# Patient Record
Sex: Male | Born: 1935
Health system: Southern US, Community
[De-identification: ages and names within clinical notes are randomized; demographics above are authoritative.]

## PROBLEM LIST (undated history)

## (undated) DIAGNOSIS — G629 Polyneuropathy, unspecified: Secondary | ICD-10-CM

## (undated) DIAGNOSIS — N170 Acute kidney failure with tubular necrosis: Secondary | ICD-10-CM

## (undated) DIAGNOSIS — I10 Essential (primary) hypertension: Secondary | ICD-10-CM

## (undated) DIAGNOSIS — Z87440 Personal history of urinary (tract) infections: Secondary | ICD-10-CM

## (undated) DIAGNOSIS — F32A Depression, unspecified: Secondary | ICD-10-CM

## (undated) DIAGNOSIS — R079 Chest pain, unspecified: Secondary | ICD-10-CM

## (undated) DIAGNOSIS — R0602 Shortness of breath: Secondary | ICD-10-CM

## (undated) DIAGNOSIS — G709 Myoneural disorder, unspecified: Secondary | ICD-10-CM

## (undated) DIAGNOSIS — C801 Malignant (primary) neoplasm, unspecified: Secondary | ICD-10-CM

## (undated) DIAGNOSIS — IMO0001 Reserved for inherently not codable concepts without codable children: Secondary | ICD-10-CM

## (undated) DIAGNOSIS — M2550 Pain in unspecified joint: Secondary | ICD-10-CM

## (undated) DIAGNOSIS — F329 Major depressive disorder, single episode, unspecified: Secondary | ICD-10-CM

## (undated) DIAGNOSIS — M109 Gout, unspecified: Secondary | ICD-10-CM

## (undated) DIAGNOSIS — D649 Anemia, unspecified: Secondary | ICD-10-CM

## (undated) DIAGNOSIS — H269 Unspecified cataract: Secondary | ICD-10-CM

## (undated) DIAGNOSIS — E785 Hyperlipidemia, unspecified: Secondary | ICD-10-CM

## (undated) DIAGNOSIS — Z860100 Personal history of colon polyps, unspecified: Secondary | ICD-10-CM

## (undated) DIAGNOSIS — K5900 Constipation, unspecified: Secondary | ICD-10-CM

## (undated) DIAGNOSIS — K649 Unspecified hemorrhoids: Secondary | ICD-10-CM

## (undated) DIAGNOSIS — G4700 Insomnia, unspecified: Secondary | ICD-10-CM

## (undated) DIAGNOSIS — N183 Chronic kidney disease, stage 3 unspecified: Secondary | ICD-10-CM

## (undated) DIAGNOSIS — N200 Calculus of kidney: Secondary | ICD-10-CM

## (undated) DIAGNOSIS — H9190 Unspecified hearing loss, unspecified ear: Secondary | ICD-10-CM

## (undated) DIAGNOSIS — Z9289 Personal history of other medical treatment: Secondary | ICD-10-CM

## (undated) DIAGNOSIS — Z8709 Personal history of other diseases of the respiratory system: Secondary | ICD-10-CM

## (undated) DIAGNOSIS — M1990 Unspecified osteoarthritis, unspecified site: Secondary | ICD-10-CM

## (undated) DIAGNOSIS — N400 Enlarged prostate without lower urinary tract symptoms: Secondary | ICD-10-CM

## (undated) HISTORY — DX: Acute kidney failure with tubular necrosis: N17.0

## (undated) HISTORY — PX: HEMORRHOID SURGERY: SHX153

## (undated) HISTORY — DX: Chronic kidney disease, stage 3 unspecified: N18.30

## (undated) HISTORY — DX: Hyperlipidemia, unspecified: E78.5

## (undated) HISTORY — PX: TONSILLECTOMY: SUR1361

## (undated) HISTORY — PX: ESOPHAGOGASTRODUODENOSCOPY: SHX1529

---

## 1962-05-17 HISTORY — PX: APPENDECTOMY: SHX54

## 1998-06-25 ENCOUNTER — Encounter: Payer: Self-pay | Admitting: Urology

## 1998-06-25 ENCOUNTER — Ambulatory Visit (HOSPITAL_BASED_OUTPATIENT_CLINIC_OR_DEPARTMENT_OTHER): Admission: RE | Admit: 1998-06-25 | Discharge: 1998-06-25 | Payer: Self-pay | Admitting: Urology

## 2003-05-18 HISTORY — PX: OTHER SURGICAL HISTORY: SHX169

## 2004-05-17 HISTORY — PX: OTHER SURGICAL HISTORY: SHX169

## 2007-05-18 HISTORY — PX: OTHER SURGICAL HISTORY: SHX169

## 2007-05-18 HISTORY — PX: JOINT REPLACEMENT: SHX530

## 2008-04-01 ENCOUNTER — Inpatient Hospital Stay (HOSPITAL_COMMUNITY): Admission: RE | Admit: 2008-04-01 | Discharge: 2008-04-03 | Payer: Self-pay | Admitting: Orthopedic Surgery

## 2008-05-17 HISTORY — PX: COLONOSCOPY: SHX174

## 2010-09-29 NOTE — Op Note (Signed)
NAME:  [name], [name]                ACCOUNT NO.:  [id number]   MEDICAL RECORD NO.:  [id number]          PATIENT TYPE:  INP   LOCATION:  5030                         FACILITY:  [facility]   PHYSICIAN:  Mila Homer. Sherlean Foot, M.D. DATE OF BIRTH:  Dec 15, 1931   DATE OF PROCEDURE:  04/01/2008  DATE OF DISCHARGE:                               OPERATIVE REPORT   SURGEON:  Mila Homer. Sherlean Foot, MD   ASSISTANTS:  Altamese Cabal, PA-C and Skip Mayer, [place]   PREOPERATIVE DIAGNOSIS:  Left knee osteoarthritis.   POSTOPERATIVE DIAGNOSIS:  Left knee osteoarthritis.   PROCEDURE:  Left total knee arthroplasty.   INDICATIONS FOR PROCEDURE:  The patient is a 77 year old white male with  failure of conservative measures for osteoarthritis of the knee and  informed consent obtained.   DESCRIPTION OF PROCEDURE:  The patient was laid supine and administered  general anesthesia.  Foley catheter placed.  In the supine position, the  left leg was prepped and draped in the usual sterile fashion.  An  Esmarch was used to exsanguinate the extremity and the tourniquet  inflated to 350 mmHg and set for 1 hour.  Midline incision was made with  a #10 blade.  New blade was used to make a medium parapatellar  arthrotomy and to perform a synovectomy.  I then elevated the deep MCL  off the medial crest of the tibia.  I everted the patella and measured  28-mm thick and reamed down 10 mL with a 38 template drilled through the  lug holes.  With the prosthetic trial in place, we created the 28-mm  thickness, then went into flexion subluxing the patella laterally.  Used  the extramedullary alignment system on the tibia to make a perpendicular  cut to the anatomic axis of the tibia.  I then used the intramedullary  alignment guide on the femur to make a 6-degree valgus cut.  I then  drilled the epicondylar axis and posterior condylar angle and measured 3  degrees and the femur sized to a size F.  I used an F four-in-one  cutting  block and made the anterior and posterior chamfer cuts.  I then  placed a lamina spreader in the knee and removed the ACL, PCL, medial  and lateral menisci, and posterior condylar osteophytes.  I then placed  a 12-mm spacer block and I had good flexion/extension gap balance.  I  then finished the femur with a size F finishing block, finished the  tibia with a size 6 tibial tray drilling keel.  Then trialed with a 6  tibia, F femur, 12 insert, 38 patella and had good flexion/extension,  gap balance and good patella tracking.  I removed the trial components  and copiously irrigated.  Then cemented the components removing excess  cement allowing the cement to harden in extension.  I placed a Hemovac  coming out superolaterally deep through the arthrotomy, and pain  catheter coming out superomedially and superficial arthrotomy.  Then, I  let the tourniquet down to obtain hemostasis and copiously irrigated it  again.  Then, I closed the arthrotomy with  figure-of-eight #1 Vicryl  sutures, deep soft tissues were buried with 0 Vicryl sutures.  I then  closed the subcuticular with a running 2-0 Vicryl stitch and the skin  with staples.  Dressed with Xeroform dressing sponges, sterile Webril,  Ace wrap and a TED stocking.   COMPLICATIONS:  None.   DRAINS:  One Hemovac, one pain catheter.   ESTIMATED BLOOD LOSS:  300 mL.   TOURNIQUET TIME:  48 minutes.           ______________________________  Mila Homer Sherlean Foot, M.D.     SDL/MEDQ  D:  04/01/2008  T:  04/01/2008  Job:  [id number]

## 2010-10-02 NOTE — Discharge Summary (Signed)
NAME:  [name], [name]                ACCOUNT NO.:  [id number]   MEDICAL RECORD NO.:  [id number]          PATIENT TYPE:  INP   LOCATION:  5030                         FACILITY:  [facility]   PHYSICIAN:  Mila Homer. Sherlean Foot, M.D. DATE OF BIRTH:  01-13-1932   DATE OF ADMISSION:  04/01/2008  DATE OF DISCHARGE:  04/03/2008                               DISCHARGE SUMMARY   ADMISSION DIAGNOSIS:  Osteoarthritis of the left knee.   DISCHARGE DIAGNOSES:  1. Osteoarthritis of the left knee.  2. Status post total knee arthroscopy, left.  3. Acute blood loss anemia status post surgery.   PROCEDURE:  Left total knee arthroscopy.   HISTORY:  The patient is a 77 year old male who comes in with a  complaint of left knee pain for several years.  The patient's pain is  constant, dull aching pain with some lacking.  He states that it does  not interfere with activities of daily living.  Conservative measures  were failed.  Risks and benefits of surgery were discussed with the  patient.  The patient would like to proceed with surgery.   ALLERGIES:  The patient has no known drug allergies.   ADMISSION MEDICATIONS:  1. Allopurinol 10 once a day.  2. EPO 300 daily.  3. Amlodipine 10 once a day.  4. Flomax 0.4 daily.  5. Lorazepam 2 daily.  6. Gabapentin 300 daily.  7. Tandem daily.  8. B12 500 mg daily.  9. Multivitamin daily.  10.Ibuprofen 200 mg as needed.  11.Tylenol 500 as needed.   The patient is not taking any blood thinners.   HOSPITAL COURSE:  This is a 77 year old male admitted on April 01, 2008 after appropriate laboratory studies were obtained preoperatively  as well as Ancef on-call to the operating room.  He was taken to the OR  where he underwent a left TKA.  The patient tolerated the procedure well  and was taken to the PACU in good condition.  The patient was placed on  Dilaudid PCA pump with reduced dose protocol.  Foley was placed  intraoperatively.   On postop day #1, vital  signs were stable.  The patient denied chest  pain, shortness of breath, or calf pain.  The patient was started on  Lovenox 30 mg subcu q.12 h. at 8 a.m.  Consults to PT, OT, and care  management were made.  The patient was weightbearing as tolerated.  CPM  0-9 degrees for 6-8 hours per day with incentive spirometry teaching.   On postop day #2, the patient continued to progress with physical  therapy.  Dressing was changed.  Marcaine pump and Hemovac were  discontinued.  Foley was discontinued.  PCA was discontinued and IV was  Hep-Lock.  The patient was continued on p.o. meds for pain.  He  tolerated well.  The patient was discharged home with Lovenox teaching.   LABORATORY STUDIES:  Upon admission, the patient's white blood cell  count was 9.8, H and H was 15.3 and 46.3, and platelets were 161.  Sodium was 138, K was 4.4, chloride 105, CO2 of 25,  glucose 95, BUN was  19, and creatinine was 1.47.  Upon discharge, white blood cell count was  9.6, H and H was 10.4 and 31.0, and platelets were 125.  Sodium was 137,  potassium 4.3, chloride was 106, CO2 27, glucose was 107, BUN 17, and  creatinine 1.77.   DISCHARGE INSTRUCTIONS:  There are no restrictions.  Follow blue  instruction sheet for wound care.  Increase activity slowly.  May use a  walker or cane.  Weightbearing as tolerated.  No lifting or driving for  6 weeks and the patient on Home Health.   DISCHARGE MEDICATIONS:  Prescriptions were given for:  1. Lovenox 100 mg inject once subcutaneously daily, last dose given on      April 15, 2008.  2. Robaxin 500 mg 1-2 tablets every 6-8 hours as needed for spasm,      #50.  3. Percocet 5/325 one to two tablets every 4-6 hours as needed for      pain.   The patient will followup with Dr. Sherlean Foot on April 15, 2008, call for  appointment at [phone].   CONDITION ON DISCHARGE:  Improved condition.     ______________________________  Altamese Cabal, PA-C     ______________________________  Mila Homer. Sherlean Foot, M.D.    MJ/MEDQ  D:  05/09/2008  T:  05/09/2008  Job:  [id number]

## 2011-02-16 LAB — BASIC METABOLIC PANEL
CO2: 27
Chloride: 100
Chloride: 106
Creatinine, Ser: 1.5
Creatinine, Ser: 1.77 — ABNORMAL HIGH
GFR calc Af Amer: 46 — ABNORMAL LOW
GFR calc Af Amer: 55 — ABNORMAL LOW
GFR calc non Af Amer: 46 — ABNORMAL LOW
Potassium: 4.3
Sodium: 137

## 2011-02-16 LAB — CROSSMATCH

## 2011-02-16 LAB — COMPREHENSIVE METABOLIC PANEL
ALT: 23
AST: 34
Albumin: 4.3
Alkaline Phosphatase: 68
BUN: 19
CO2: 25
Calcium: 9.6
Chloride: 105
Creatinine, Ser: 1.47
GFR calc Af Amer: 57 — ABNORMAL LOW
GFR calc non Af Amer: 47 — ABNORMAL LOW
Glucose, Bld: 95
Potassium: 4.4
Sodium: 138
Total Bilirubin: 1.4 — ABNORMAL HIGH
Total Protein: 7

## 2011-02-16 LAB — CBC
HCT: 31 — ABNORMAL LOW
HCT: 46.3
Hemoglobin: 10.4 — ABNORMAL LOW
Hemoglobin: 15.3
MCHC: 33
MCHC: 33.6
MCV: 95.6
MCV: 97
MCV: 97.2
Platelets: 161
RBC: 3.19 — ABNORMAL LOW
RBC: 3.51 — ABNORMAL LOW
RBC: 4.77
RDW: 14.6
WBC: 9.6
WBC: 9.8
WBC: 9.8

## 2011-02-16 LAB — URINALYSIS, ROUTINE W REFLEX MICROSCOPIC
Nitrite: NEGATIVE
Specific Gravity, Urine: 1.017
Urobilinogen, UA: 0.2

## 2011-02-16 LAB — PROTIME-INR
INR: 1
Prothrombin Time: 13.7

## 2011-02-16 LAB — DIFFERENTIAL
Basophils Absolute: 0.1
Basophils Relative: 1
Eosinophils Absolute: 0.1
Eosinophils Relative: 1
Lymphocytes Relative: 14
Lymphs Abs: 1.4
Monocytes Absolute: 0.6
Monocytes Relative: 6
Neutro Abs: 7.7
Neutrophils Relative %: 79 — ABNORMAL HIGH

## 2011-02-16 LAB — APTT: aPTT: 41 — ABNORMAL HIGH

## 2011-02-16 LAB — ABO/RH: ABO/RH(D): B NEG

## 2011-02-16 LAB — URINE CULTURE

## 2011-05-25 DIAGNOSIS — H9190 Unspecified hearing loss, unspecified ear: Secondary | ICD-10-CM | POA: Diagnosis not present

## 2011-05-25 DIAGNOSIS — H6120 Impacted cerumen, unspecified ear: Secondary | ICD-10-CM | POA: Diagnosis not present

## 2011-06-22 DIAGNOSIS — B351 Tinea unguium: Secondary | ICD-10-CM | POA: Diagnosis not present

## 2011-06-22 DIAGNOSIS — M79609 Pain in unspecified limb: Secondary | ICD-10-CM | POA: Diagnosis not present

## 2011-07-12 DIAGNOSIS — R109 Unspecified abdominal pain: Secondary | ICD-10-CM | POA: Diagnosis not present

## 2011-08-03 DIAGNOSIS — H2510 Age-related nuclear cataract, unspecified eye: Secondary | ICD-10-CM | POA: Diagnosis not present

## 2011-08-31 DIAGNOSIS — M79609 Pain in unspecified limb: Secondary | ICD-10-CM | POA: Diagnosis not present

## 2011-08-31 DIAGNOSIS — B351 Tinea unguium: Secondary | ICD-10-CM | POA: Diagnosis not present

## 2011-09-25 DIAGNOSIS — J069 Acute upper respiratory infection, unspecified: Secondary | ICD-10-CM | POA: Diagnosis not present

## 2011-09-25 DIAGNOSIS — R05 Cough: Secondary | ICD-10-CM | POA: Diagnosis not present

## 2011-10-05 DIAGNOSIS — N529 Male erectile dysfunction, unspecified: Secondary | ICD-10-CM | POA: Diagnosis not present

## 2011-10-05 DIAGNOSIS — N401 Enlarged prostate with lower urinary tract symptoms: Secondary | ICD-10-CM | POA: Diagnosis not present

## 2011-10-05 DIAGNOSIS — B3789 Other sites of candidiasis: Secondary | ICD-10-CM | POA: Diagnosis not present

## 2011-10-06 DIAGNOSIS — R109 Unspecified abdominal pain: Secondary | ICD-10-CM | POA: Diagnosis not present

## 2011-10-12 DIAGNOSIS — M5430 Sciatica, unspecified side: Secondary | ICD-10-CM | POA: Diagnosis not present

## 2011-10-14 DIAGNOSIS — K7689 Other specified diseases of liver: Secondary | ICD-10-CM | POA: Diagnosis not present

## 2011-10-14 DIAGNOSIS — N289 Disorder of kidney and ureter, unspecified: Secondary | ICD-10-CM | POA: Diagnosis not present

## 2011-10-14 DIAGNOSIS — R109 Unspecified abdominal pain: Secondary | ICD-10-CM | POA: Diagnosis not present

## 2011-10-20 DIAGNOSIS — E669 Obesity, unspecified: Secondary | ICD-10-CM | POA: Diagnosis not present

## 2011-10-20 DIAGNOSIS — M5430 Sciatica, unspecified side: Secondary | ICD-10-CM | POA: Diagnosis not present

## 2011-10-21 DIAGNOSIS — M545 Low back pain: Secondary | ICD-10-CM | POA: Diagnosis not present

## 2011-10-25 DIAGNOSIS — M545 Low back pain: Secondary | ICD-10-CM | POA: Diagnosis not present

## 2011-10-26 DIAGNOSIS — M5430 Sciatica, unspecified side: Secondary | ICD-10-CM | POA: Diagnosis not present

## 2011-10-26 DIAGNOSIS — F329 Major depressive disorder, single episode, unspecified: Secondary | ICD-10-CM | POA: Diagnosis not present

## 2011-10-27 DIAGNOSIS — M5137 Other intervertebral disc degeneration, lumbosacral region: Secondary | ICD-10-CM | POA: Diagnosis not present

## 2011-10-27 DIAGNOSIS — M47817 Spondylosis without myelopathy or radiculopathy, lumbosacral region: Secondary | ICD-10-CM | POA: Diagnosis not present

## 2011-10-27 DIAGNOSIS — M5430 Sciatica, unspecified side: Secondary | ICD-10-CM | POA: Diagnosis not present

## 2011-10-27 DIAGNOSIS — M48061 Spinal stenosis, lumbar region without neurogenic claudication: Secondary | ICD-10-CM | POA: Diagnosis not present

## 2011-11-02 DIAGNOSIS — IMO0002 Reserved for concepts with insufficient information to code with codable children: Secondary | ICD-10-CM | POA: Diagnosis not present

## 2011-11-02 DIAGNOSIS — M5126 Other intervertebral disc displacement, lumbar region: Secondary | ICD-10-CM | POA: Diagnosis not present

## 2011-11-04 DIAGNOSIS — L821 Other seborrheic keratosis: Secondary | ICD-10-CM | POA: Diagnosis not present

## 2011-11-04 DIAGNOSIS — L570 Actinic keratosis: Secondary | ICD-10-CM | POA: Diagnosis not present

## 2011-11-04 DIAGNOSIS — L578 Other skin changes due to chronic exposure to nonionizing radiation: Secondary | ICD-10-CM | POA: Diagnosis not present

## 2011-11-05 DIAGNOSIS — IMO0002 Reserved for concepts with insufficient information to code with codable children: Secondary | ICD-10-CM | POA: Diagnosis not present

## 2011-11-05 DIAGNOSIS — M48061 Spinal stenosis, lumbar region without neurogenic claudication: Secondary | ICD-10-CM | POA: Diagnosis not present

## 2011-11-09 DIAGNOSIS — B351 Tinea unguium: Secondary | ICD-10-CM | POA: Diagnosis not present

## 2011-11-09 DIAGNOSIS — M79609 Pain in unspecified limb: Secondary | ICD-10-CM | POA: Diagnosis not present

## 2011-11-22 DIAGNOSIS — IMO0002 Reserved for concepts with insufficient information to code with codable children: Secondary | ICD-10-CM | POA: Diagnosis not present

## 2011-11-22 DIAGNOSIS — M48061 Spinal stenosis, lumbar region without neurogenic claudication: Secondary | ICD-10-CM | POA: Diagnosis not present

## 2011-12-13 DIAGNOSIS — M48061 Spinal stenosis, lumbar region without neurogenic claudication: Secondary | ICD-10-CM | POA: Diagnosis not present

## 2011-12-13 DIAGNOSIS — IMO0002 Reserved for concepts with insufficient information to code with codable children: Secondary | ICD-10-CM | POA: Diagnosis not present

## 2012-01-04 DIAGNOSIS — M5430 Sciatica, unspecified side: Secondary | ICD-10-CM | POA: Diagnosis not present

## 2012-01-04 DIAGNOSIS — G479 Sleep disorder, unspecified: Secondary | ICD-10-CM | POA: Diagnosis not present

## 2012-01-10 DIAGNOSIS — M5126 Other intervertebral disc displacement, lumbar region: Secondary | ICD-10-CM | POA: Diagnosis not present

## 2012-01-10 DIAGNOSIS — IMO0002 Reserved for concepts with insufficient information to code with codable children: Secondary | ICD-10-CM | POA: Diagnosis not present

## 2012-01-13 DIAGNOSIS — M79609 Pain in unspecified limb: Secondary | ICD-10-CM | POA: Diagnosis not present

## 2012-01-13 DIAGNOSIS — B351 Tinea unguium: Secondary | ICD-10-CM | POA: Diagnosis not present

## 2012-01-27 DIAGNOSIS — IMO0002 Reserved for concepts with insufficient information to code with codable children: Secondary | ICD-10-CM | POA: Diagnosis not present

## 2012-01-27 DIAGNOSIS — M5137 Other intervertebral disc degeneration, lumbosacral region: Secondary | ICD-10-CM | POA: Diagnosis not present

## 2012-01-27 DIAGNOSIS — Z79899 Other long term (current) drug therapy: Secondary | ICD-10-CM | POA: Diagnosis not present

## 2012-01-27 DIAGNOSIS — Z5181 Encounter for therapeutic drug level monitoring: Secondary | ICD-10-CM | POA: Diagnosis not present

## 2012-02-10 DIAGNOSIS — Z23 Encounter for immunization: Secondary | ICD-10-CM | POA: Diagnosis not present

## 2012-02-10 DIAGNOSIS — J069 Acute upper respiratory infection, unspecified: Secondary | ICD-10-CM | POA: Diagnosis not present

## 2012-02-23 DIAGNOSIS — M5137 Other intervertebral disc degeneration, lumbosacral region: Secondary | ICD-10-CM | POA: Diagnosis not present

## 2012-03-06 DIAGNOSIS — M545 Low back pain: Secondary | ICD-10-CM | POA: Diagnosis not present

## 2012-03-06 DIAGNOSIS — G894 Chronic pain syndrome: Secondary | ICD-10-CM | POA: Diagnosis not present

## 2012-03-20 DIAGNOSIS — L821 Other seborrheic keratosis: Secondary | ICD-10-CM | POA: Diagnosis not present

## 2012-03-20 DIAGNOSIS — D2370 Other benign neoplasm of skin of unspecified lower limb, including hip: Secondary | ICD-10-CM | POA: Diagnosis not present

## 2012-03-20 DIAGNOSIS — L570 Actinic keratosis: Secondary | ICD-10-CM | POA: Diagnosis not present

## 2012-03-22 DIAGNOSIS — M5137 Other intervertebral disc degeneration, lumbosacral region: Secondary | ICD-10-CM | POA: Diagnosis not present

## 2012-03-29 DIAGNOSIS — M79609 Pain in unspecified limb: Secondary | ICD-10-CM | POA: Diagnosis not present

## 2012-03-29 DIAGNOSIS — B351 Tinea unguium: Secondary | ICD-10-CM | POA: Diagnosis not present

## 2012-04-03 DIAGNOSIS — M545 Low back pain: Secondary | ICD-10-CM | POA: Diagnosis not present

## 2012-04-03 DIAGNOSIS — M79609 Pain in unspecified limb: Secondary | ICD-10-CM | POA: Diagnosis not present

## 2012-04-03 DIAGNOSIS — G894 Chronic pain syndrome: Secondary | ICD-10-CM | POA: Diagnosis not present

## 2012-04-17 DIAGNOSIS — J209 Acute bronchitis, unspecified: Secondary | ICD-10-CM | POA: Diagnosis not present

## 2012-05-17 HISTORY — PX: OTHER SURGICAL HISTORY: SHX169

## 2012-05-26 DIAGNOSIS — L821 Other seborrheic keratosis: Secondary | ICD-10-CM | POA: Diagnosis not present

## 2012-05-26 DIAGNOSIS — F329 Major depressive disorder, single episode, unspecified: Secondary | ICD-10-CM | POA: Diagnosis not present

## 2012-05-26 DIAGNOSIS — C44320 Squamous cell carcinoma of skin of unspecified parts of face: Secondary | ICD-10-CM | POA: Diagnosis not present

## 2012-05-26 DIAGNOSIS — G609 Hereditary and idiopathic neuropathy, unspecified: Secondary | ICD-10-CM | POA: Diagnosis not present

## 2012-05-26 DIAGNOSIS — L570 Actinic keratosis: Secondary | ICD-10-CM | POA: Diagnosis not present

## 2012-05-26 DIAGNOSIS — I10 Essential (primary) hypertension: Secondary | ICD-10-CM | POA: Diagnosis not present

## 2012-05-26 DIAGNOSIS — E7800 Pure hypercholesterolemia, unspecified: Secondary | ICD-10-CM | POA: Diagnosis not present

## 2012-05-26 DIAGNOSIS — G479 Sleep disorder, unspecified: Secondary | ICD-10-CM | POA: Diagnosis not present

## 2012-05-29 DIAGNOSIS — M79609 Pain in unspecified limb: Secondary | ICD-10-CM | POA: Diagnosis not present

## 2012-05-29 DIAGNOSIS — M25559 Pain in unspecified hip: Secondary | ICD-10-CM | POA: Diagnosis not present

## 2012-05-29 DIAGNOSIS — M545 Low back pain: Secondary | ICD-10-CM | POA: Diagnosis not present

## 2012-05-29 DIAGNOSIS — G894 Chronic pain syndrome: Secondary | ICD-10-CM | POA: Diagnosis not present

## 2012-06-11 DIAGNOSIS — I889 Nonspecific lymphadenitis, unspecified: Secondary | ICD-10-CM | POA: Diagnosis not present

## 2012-06-21 DIAGNOSIS — C44319 Basal cell carcinoma of skin of other parts of face: Secondary | ICD-10-CM | POA: Diagnosis not present

## 2012-06-22 DIAGNOSIS — N401 Enlarged prostate with lower urinary tract symptoms: Secondary | ICD-10-CM | POA: Diagnosis not present

## 2012-06-22 DIAGNOSIS — Z125 Encounter for screening for malignant neoplasm of prostate: Secondary | ICD-10-CM | POA: Diagnosis not present

## 2012-06-22 DIAGNOSIS — R3911 Hesitancy of micturition: Secondary | ICD-10-CM | POA: Diagnosis not present

## 2012-07-01 DIAGNOSIS — J069 Acute upper respiratory infection, unspecified: Secondary | ICD-10-CM | POA: Diagnosis not present

## 2012-07-01 DIAGNOSIS — J209 Acute bronchitis, unspecified: Secondary | ICD-10-CM | POA: Diagnosis not present

## 2012-07-03 DIAGNOSIS — J189 Pneumonia, unspecified organism: Secondary | ICD-10-CM | POA: Diagnosis not present

## 2012-07-03 DIAGNOSIS — R062 Wheezing: Secondary | ICD-10-CM | POA: Diagnosis not present

## 2012-07-06 DIAGNOSIS — R062 Wheezing: Secondary | ICD-10-CM | POA: Diagnosis not present

## 2012-07-06 DIAGNOSIS — J189 Pneumonia, unspecified organism: Secondary | ICD-10-CM | POA: Diagnosis not present

## 2012-07-24 DIAGNOSIS — M25559 Pain in unspecified hip: Secondary | ICD-10-CM | POA: Diagnosis not present

## 2012-07-24 DIAGNOSIS — M545 Low back pain: Secondary | ICD-10-CM | POA: Diagnosis not present

## 2012-07-24 DIAGNOSIS — M79609 Pain in unspecified limb: Secondary | ICD-10-CM | POA: Diagnosis not present

## 2012-07-24 DIAGNOSIS — G894 Chronic pain syndrome: Secondary | ICD-10-CM | POA: Diagnosis not present

## 2012-08-10 DIAGNOSIS — B351 Tinea unguium: Secondary | ICD-10-CM | POA: Diagnosis not present

## 2012-08-10 DIAGNOSIS — M79609 Pain in unspecified limb: Secondary | ICD-10-CM | POA: Diagnosis not present

## 2012-08-17 DIAGNOSIS — M25559 Pain in unspecified hip: Secondary | ICD-10-CM | POA: Diagnosis not present

## 2012-08-17 DIAGNOSIS — G894 Chronic pain syndrome: Secondary | ICD-10-CM | POA: Diagnosis not present

## 2012-08-17 DIAGNOSIS — Z96659 Presence of unspecified artificial knee joint: Secondary | ICD-10-CM | POA: Diagnosis not present

## 2012-08-17 DIAGNOSIS — M79609 Pain in unspecified limb: Secondary | ICD-10-CM | POA: Diagnosis not present

## 2012-08-17 DIAGNOSIS — M545 Low back pain: Secondary | ICD-10-CM | POA: Diagnosis not present

## 2012-08-17 HISTORY — DX: Presence of unspecified artificial knee joint: Z96.659

## 2012-09-06 DIAGNOSIS — G894 Chronic pain syndrome: Secondary | ICD-10-CM | POA: Diagnosis not present

## 2012-09-06 DIAGNOSIS — M545 Low back pain, unspecified: Secondary | ICD-10-CM | POA: Diagnosis not present

## 2012-09-06 DIAGNOSIS — M5137 Other intervertebral disc degeneration, lumbosacral region: Secondary | ICD-10-CM | POA: Diagnosis not present

## 2012-09-06 DIAGNOSIS — IMO0002 Reserved for concepts with insufficient information to code with codable children: Secondary | ICD-10-CM | POA: Diagnosis not present

## 2012-09-18 DIAGNOSIS — L821 Other seborrheic keratosis: Secondary | ICD-10-CM | POA: Diagnosis not present

## 2012-09-18 DIAGNOSIS — L570 Actinic keratosis: Secondary | ICD-10-CM | POA: Diagnosis not present

## 2012-09-20 DIAGNOSIS — IMO0002 Reserved for concepts with insufficient information to code with codable children: Secondary | ICD-10-CM | POA: Diagnosis not present

## 2012-09-20 DIAGNOSIS — M5137 Other intervertebral disc degeneration, lumbosacral region: Secondary | ICD-10-CM | POA: Diagnosis not present

## 2012-09-20 DIAGNOSIS — G894 Chronic pain syndrome: Secondary | ICD-10-CM | POA: Diagnosis not present

## 2012-10-13 DIAGNOSIS — M47817 Spondylosis without myelopathy or radiculopathy, lumbosacral region: Secondary | ICD-10-CM | POA: Diagnosis not present

## 2012-10-13 DIAGNOSIS — IMO0002 Reserved for concepts with insufficient information to code with codable children: Secondary | ICD-10-CM | POA: Diagnosis not present

## 2012-10-13 DIAGNOSIS — G894 Chronic pain syndrome: Secondary | ICD-10-CM | POA: Diagnosis not present

## 2012-10-13 DIAGNOSIS — M5137 Other intervertebral disc degeneration, lumbosacral region: Secondary | ICD-10-CM | POA: Diagnosis not present

## 2012-10-17 DIAGNOSIS — B351 Tinea unguium: Secondary | ICD-10-CM | POA: Diagnosis not present

## 2012-10-17 DIAGNOSIS — M79609 Pain in unspecified limb: Secondary | ICD-10-CM | POA: Diagnosis not present

## 2012-11-22 DIAGNOSIS — M545 Low back pain, unspecified: Secondary | ICD-10-CM | POA: Diagnosis not present

## 2012-11-22 DIAGNOSIS — G8929 Other chronic pain: Secondary | ICD-10-CM | POA: Diagnosis not present

## 2012-11-22 DIAGNOSIS — G894 Chronic pain syndrome: Secondary | ICD-10-CM | POA: Diagnosis not present

## 2012-12-18 DIAGNOSIS — M47817 Spondylosis without myelopathy or radiculopathy, lumbosacral region: Secondary | ICD-10-CM | POA: Diagnosis not present

## 2012-12-18 DIAGNOSIS — M5137 Other intervertebral disc degeneration, lumbosacral region: Secondary | ICD-10-CM | POA: Diagnosis not present

## 2012-12-18 DIAGNOSIS — G894 Chronic pain syndrome: Secondary | ICD-10-CM | POA: Diagnosis not present

## 2012-12-18 DIAGNOSIS — IMO0002 Reserved for concepts with insufficient information to code with codable children: Secondary | ICD-10-CM | POA: Diagnosis not present

## 2012-12-20 DIAGNOSIS — N529 Male erectile dysfunction, unspecified: Secondary | ICD-10-CM | POA: Diagnosis not present

## 2012-12-20 DIAGNOSIS — N401 Enlarged prostate with lower urinary tract symptoms: Secondary | ICD-10-CM | POA: Diagnosis not present

## 2012-12-21 DIAGNOSIS — Z006 Encounter for examination for normal comparison and control in clinical research program: Secondary | ICD-10-CM | POA: Diagnosis not present

## 2012-12-21 DIAGNOSIS — I10 Essential (primary) hypertension: Secondary | ICD-10-CM | POA: Diagnosis not present

## 2012-12-21 DIAGNOSIS — G479 Sleep disorder, unspecified: Secondary | ICD-10-CM | POA: Diagnosis not present

## 2012-12-21 DIAGNOSIS — Z0000 Encounter for general adult medical examination without abnormal findings: Secondary | ICD-10-CM | POA: Diagnosis not present

## 2012-12-26 DIAGNOSIS — M79609 Pain in unspecified limb: Secondary | ICD-10-CM | POA: Diagnosis not present

## 2012-12-26 DIAGNOSIS — B351 Tinea unguium: Secondary | ICD-10-CM | POA: Diagnosis not present

## 2013-01-02 DIAGNOSIS — IMO0002 Reserved for concepts with insufficient information to code with codable children: Secondary | ICD-10-CM | POA: Diagnosis not present

## 2013-01-04 DIAGNOSIS — M5430 Sciatica, unspecified side: Secondary | ICD-10-CM | POA: Diagnosis not present

## 2013-01-05 DIAGNOSIS — M47817 Spondylosis without myelopathy or radiculopathy, lumbosacral region: Secondary | ICD-10-CM | POA: Diagnosis not present

## 2013-01-05 DIAGNOSIS — IMO0002 Reserved for concepts with insufficient information to code with codable children: Secondary | ICD-10-CM | POA: Diagnosis not present

## 2013-01-05 DIAGNOSIS — G894 Chronic pain syndrome: Secondary | ICD-10-CM | POA: Diagnosis not present

## 2013-01-05 DIAGNOSIS — M5137 Other intervertebral disc degeneration, lumbosacral region: Secondary | ICD-10-CM | POA: Diagnosis not present

## 2013-01-22 DIAGNOSIS — I10 Essential (primary) hypertension: Secondary | ICD-10-CM | POA: Diagnosis not present

## 2013-01-22 DIAGNOSIS — M5430 Sciatica, unspecified side: Secondary | ICD-10-CM | POA: Diagnosis not present

## 2013-01-22 DIAGNOSIS — M549 Dorsalgia, unspecified: Secondary | ICD-10-CM | POA: Diagnosis not present

## 2013-01-22 DIAGNOSIS — M25569 Pain in unspecified knee: Secondary | ICD-10-CM | POA: Diagnosis not present

## 2013-01-22 DIAGNOSIS — R296 Repeated falls: Secondary | ICD-10-CM | POA: Diagnosis not present

## 2013-01-22 DIAGNOSIS — M25559 Pain in unspecified hip: Secondary | ICD-10-CM | POA: Diagnosis not present

## 2013-01-29 DIAGNOSIS — Z23 Encounter for immunization: Secondary | ICD-10-CM | POA: Diagnosis not present

## 2013-01-29 DIAGNOSIS — I10 Essential (primary) hypertension: Secondary | ICD-10-CM | POA: Diagnosis not present

## 2013-01-29 DIAGNOSIS — M5430 Sciatica, unspecified side: Secondary | ICD-10-CM | POA: Diagnosis not present

## 2013-01-30 ENCOUNTER — Emergency Department (HOSPITAL_COMMUNITY): Payer: Medicare Other

## 2013-01-30 ENCOUNTER — Encounter (HOSPITAL_COMMUNITY): Payer: Self-pay | Admitting: *Deleted

## 2013-01-30 ENCOUNTER — Emergency Department (HOSPITAL_COMMUNITY)
Admission: EM | Admit: 2013-01-30 | Discharge: 2013-01-30 | Disposition: A | Discharge status: Home / Self Care | Payer: Medicare Other | Attending: Emergency Medicine | Admitting: Emergency Medicine

## 2013-01-30 DIAGNOSIS — M25559 Pain in unspecified hip: Secondary | ICD-10-CM | POA: Diagnosis not present

## 2013-01-30 DIAGNOSIS — S79919A Unspecified injury of unspecified hip, initial encounter: Secondary | ICD-10-CM | POA: Diagnosis not present

## 2013-01-30 DIAGNOSIS — Z79899 Other long term (current) drug therapy: Secondary | ICD-10-CM | POA: Diagnosis not present

## 2013-01-30 DIAGNOSIS — S76012A Strain of muscle, fascia and tendon of left hip, initial encounter: Secondary | ICD-10-CM

## 2013-01-30 DIAGNOSIS — IMO0002 Reserved for concepts with insufficient information to code with codable children: Secondary | ICD-10-CM | POA: Diagnosis not present

## 2013-01-30 DIAGNOSIS — Y929 Unspecified place or not applicable: Secondary | ICD-10-CM | POA: Insufficient documentation

## 2013-01-30 DIAGNOSIS — G9009 Other idiopathic peripheral autonomic neuropathy: Secondary | ICD-10-CM | POA: Diagnosis not present

## 2013-01-30 DIAGNOSIS — R112 Nausea with vomiting, unspecified: Secondary | ICD-10-CM | POA: Diagnosis not present

## 2013-01-30 DIAGNOSIS — I10 Essential (primary) hypertension: Secondary | ICD-10-CM | POA: Insufficient documentation

## 2013-01-30 DIAGNOSIS — N400 Enlarged prostate without lower urinary tract symptoms: Secondary | ICD-10-CM | POA: Diagnosis not present

## 2013-01-30 DIAGNOSIS — M25569 Pain in unspecified knee: Secondary | ICD-10-CM | POA: Diagnosis not present

## 2013-01-30 DIAGNOSIS — M48061 Spinal stenosis, lumbar region without neurogenic claudication: Secondary | ICD-10-CM | POA: Diagnosis not present

## 2013-01-30 DIAGNOSIS — M25552 Pain in left hip: Secondary | ICD-10-CM

## 2013-01-30 DIAGNOSIS — S8990XA Unspecified injury of unspecified lower leg, initial encounter: Secondary | ICD-10-CM | POA: Diagnosis not present

## 2013-01-30 DIAGNOSIS — Y9389 Activity, other specified: Secondary | ICD-10-CM | POA: Insufficient documentation

## 2013-01-30 DIAGNOSIS — S336XXA Sprain of sacroiliac joint, initial encounter: Secondary | ICD-10-CM | POA: Diagnosis not present

## 2013-01-30 DIAGNOSIS — W19XXXA Unspecified fall, initial encounter: Secondary | ICD-10-CM | POA: Insufficient documentation

## 2013-01-30 DIAGNOSIS — M5126 Other intervertebral disc displacement, lumbar region: Secondary | ICD-10-CM | POA: Diagnosis not present

## 2013-01-30 HISTORY — DX: Polyneuropathy, unspecified: G62.9

## 2013-01-30 HISTORY — DX: Essential (primary) hypertension: I10

## 2013-01-30 HISTORY — DX: Benign prostatic hyperplasia without lower urinary tract symptoms: N40.0

## 2013-01-30 LAB — URINALYSIS, ROUTINE W REFLEX MICROSCOPIC
Glucose, UA: NEGATIVE mg/dL
Hgb urine dipstick: NEGATIVE
Leukocytes, UA: NEGATIVE
Protein, ur: NEGATIVE mg/dL
Specific Gravity, Urine: 1.005 (ref 1.005–1.030)
Urobilinogen, UA: 0.2 mg/dL (ref 0.0–1.0)

## 2013-01-30 LAB — CBC WITH DIFFERENTIAL/PLATELET
Basophils Relative: 0 % (ref 0–1)
Eosinophils Absolute: 0.1 10*3/uL (ref 0.0–0.7)
Hemoglobin: 14.5 g/dL (ref 13.0–17.0)
Lymphs Abs: 1.5 10*3/uL (ref 0.7–4.0)
Monocytes Relative: 6 % (ref 3–12)
Neutro Abs: 10.4 10*3/uL — ABNORMAL HIGH (ref 1.7–7.7)
Neutrophils Relative %: 82 % — ABNORMAL HIGH (ref 43–77)
Platelets: 189 10*3/uL (ref 150–400)
RBC: 4.71 MIL/uL (ref 4.22–5.81)

## 2013-01-30 LAB — COMPREHENSIVE METABOLIC PANEL
BUN: 14 mg/dL (ref 6–23)
CO2: 27 mEq/L (ref 19–32)
Calcium: 10.2 mg/dL (ref 8.4–10.5)
Chloride: 97 mEq/L (ref 96–112)
Creatinine, Ser: 1.3 mg/dL (ref 0.50–1.35)
GFR calc Af Amer: 59 mL/min — ABNORMAL LOW (ref >90)
GFR calc non Af Amer: 51 mL/min — ABNORMAL LOW (ref >90)
Glucose, Bld: 124 mg/dL — ABNORMAL HIGH (ref 70–99)
Total Bilirubin: 0.8 mg/dL (ref 0.3–1.2)

## 2013-01-30 MED ORDER — LORAZEPAM 2 MG/ML IJ SOLN
0.5000 mg | Freq: Once | INTRAMUSCULAR | Status: AC
  Administered 2013-01-30: 0.5 mg via INTRAVENOUS

## 2013-01-30 MED ORDER — LORAZEPAM 2 MG/ML IJ SOLN
1.0000 mg | Freq: Once | INTRAMUSCULAR | Status: DC
  Filled 2013-01-30: qty 1

## 2013-01-30 NOTE — ED Provider Notes (Signed)
CSN: [id number]     Arrival date & time 01/30/13  1212 History   First MD Initiated Contact with Patient 01/30/13 1232     Chief Complaint  Patient presents with  . Leg Pain  . Fall   (Consider location/radiation/quality/duration/timing/severity/associated sxs/prior Treatment) HPI  [name] is a 79 y.o.male with a significant PMH of hypertension, neuropathy, enlarged prostate presents to the ER with complaints of  left leg pain and weakness as well as nausea and vomiting without abdominal pain. He has a long history of pain to the left leg for which he has received injections. Over the past couple of days he has fallen 4 times and since then has had worsened weakness to his left leg. He takes Vicodin 10-325 at home for pain and has over the past 3 weeks which has helped some with the pain but he is still not able to ambulate even with his walker. This is what brought him to the ER. The patient is very active and is troubled by not being able to get out of the house much and out and about. He also has had nausea and vomiting this morning but without belly pain or dysuria. Denies loc, syncope, change in weight, generalized weakness or fevers.    Past Medical History  Diagnosis Date  . Hypertension   . Neuropathy   . Enlarged prostate    Past Surgical History  Procedure Laterality Date  . Joint replacement     History reviewed. No pertinent family history. History  Substance Use Topics  . Smoking status: Not on file  . Smokeless tobacco: Not on file  . Alcohol Use: No    Review of Systems  All other systems reviewed and are negative.    Allergies  Review of patient's allergies indicates no known allergies.  Home Medications   Current Outpatient Rx  Name  Route  Sig  Dispense  Refill  . acetaminophen (TYLENOL) 500 MG tablet   Oral   Take 1,000 mg by mouth every 6 (six) hours as needed for pain.         Marland Kitchen allopurinol (ZYLOPRIM) 300 MG tablet   Oral   Take 300 mg by  mouth every morning.         Marland Kitchen amLODipine (NORVASC) 10 MG tablet   Oral   Take 10 mg by mouth every morning.         . cyanocobalamin 500 MCG tablet   Oral   Take 500 mcg by mouth daily.         . ferrous fumarate-iron polysaccharide complex (TANDEM) 162-115.2 MG CAPS   Oral   Take 1 capsule by mouth daily with breakfast.         . gabapentin (NEURONTIN) 600 MG tablet   Oral   Take 600 mg by mouth 2 (two) times daily.         Marland Kitchen ibuprofen (ADVIL,MOTRIN) 200 MG tablet   Oral   Take 400 mg by mouth every 6 (six) hours as needed for pain.         Marland Kitchen irbesartan (AVAPRO) 300 MG tablet   Oral   Take 300 mg by mouth every morning.         . Multiple Vitamin (MULTIVITAMIN WITH MINERALS) TABS tablet   Oral   Take 1 tablet by mouth daily.         Marland Kitchen oxyCODONE-acetaminophen (PERCOCET) 10-325 MG per tablet   Oral   Take 1 tablet by mouth every  4 (four) hours as needed for pain.         . tamsulosin (FLOMAX) 0.4 MG CAPS capsule   Oral   Take 0.4 mg by mouth daily after breakfast.         . zolpidem (AMBIEN) 10 MG tablet   Oral   Take 10 mg by mouth at bedtime.          BP 150/82  Pulse 76  Temp(Src) 97.8 F (36.6 C) (Oral)  Resp 18  SpO2 93% Physical Exam  Nursing note and vitals reviewed. Constitutional: He appears well-developed and well-nourished. No distress.  HENT:  Head: Normocephalic and atraumatic.  Eyes: Pupils are equal, round, and reactive to light.  Neck: Normal range of motion. Neck supple.  Cardiovascular: Normal rate and regular rhythm.   Pulmonary/Chest: Effort normal.  Abdominal: Soft.  Musculoskeletal:       Left hip: He exhibits decreased strength. He exhibits normal range of motion, no tenderness, no bony tenderness, no swelling, no crepitus, no deformity and no laceration.  Pt does not have tenderness to palpation of left hip. He does not have pain with ROM. He has equal ankle strength.  Pt has significant weakness in his left  hip and good strength in the right side. His pedal pulses are equal bilaterally.   Neurological: He is alert.  Skin: Skin is warm and dry.    ED Course  Procedures (including critical care time) Labs Review Labs Reviewed  COMPREHENSIVE METABOLIC PANEL - Abnormal; Notable for the following:    Glucose, Bld 124 (*)    GFR calc non Af Amer 51 (*)    GFR calc Af Amer 59 (*)    All other components within normal limits  CBC WITH DIFFERENTIAL - Abnormal; Notable for the following:    WBC 12.7 (*)    Neutrophils Relative % 82 (*)    Neutro Abs 10.4 (*)    All other components within normal limits  LIPASE, BLOOD  URINALYSIS, ROUTINE W REFLEX MICROSCOPIC   Imaging Review Mr Lumbar Spine Wo Contrast  01/30/2013   *RADIOLOGY REPORT*  Clinical Data: Back pain with left leg giving out.  MRI LUMBAR SPINE WITHOUT CONTRAST  Multiplanar and multiecho pulse sequences of the lumbar spine were obtained without intravenous contrast.  Comparison: 10/27/2011 MR.  Findings: Last fully open disc space is labeled L5-S1.  Present examination incorporates from T10-11 and disc space through the lower S2 level.  Conus L1 level.  Probable hemangioma within left aspect of the T11 vertebral body without change.  Visualized paravertebral structures without worrisome abnormality. Dependent subcutaneous edema has progressed since prior exam.  T10-11 through T12-L1 unremarkable.  L1-2:  Facet joint degenerative changes.  Bulge slightly greater right lateral position.  No compression of the exiting nerve root. Very mild spinal stenosis.  L2-3:  Marked disc degeneration with broad-based disc osteophyte complex with slight retrolisthesis of L2.  Facet joint degenerative changes.  Multifactorial moderate spinal stenosis.  The right lateral extension of disc/osteophyte approaches but does not cause significant compression of the exiting right L2 nerve root.  L3-4:  Large broad-based disc protrusion with greatest extension centrally  and left lateral position. Mass effect upon the exiting left L3 nerve root.  Facet joint degenerative changes.  Ligamentum flavum hypertrophy.  Short pedicles.  Severe spinal stenosis.  L4-5:  Broad-based disc osteophyte complex.  Facet joint degenerative changes.  Ligamentum flavum hypertrophy.  Short pedicles.  Severe spinal stenosis.  Moderate foraminal narrowing greater on  the left with encroachment upon the exiting L4 nerve roots greater on the left.  L5-S1:  Prominent facet joint degenerative changes.  Bulge/broad- based protrusion.  Centrally this contributes to the moderate left- sided lateral recess stenosis and mild spinal stenosis.  Bilateral foramen greater left without nerve root compression.  IMPRESSION: Progressive degenerative changes since the prior examination with findings most notable L3-4 and L4-5 level. Summary of pertinent findings includes:  L1-2 very mild spinal stenosis.  L2-3 multifactorial moderate spinal stenosis.  Right lateral extension of disc/osteophyte approaches but does not cause significant compression of the exiting right L2 nerve root.  L3-4 large broad-based disc protrusion with greatest extension centrally and left lateral position. Mass effect upon the exiting left L3 nerve root. Multifactorial severe spinal stenosis.  L4-5 multifactorial severe spinal stenosis.  Moderate foraminal narrowing greater on the left with encroachment upon the exiting L4 nerve roots greater on the left.  L5-S1 moderate left lateral recess stenosis and mild spinal stenosis.  Bilateral foramen greater on the left without nerve root compression.   Original Report Authenticated By: Lacy Duverney, M.D.   Mr Hip Left Wo Contrast  01/30/2013   CLINICAL DATA:  History of falls. Left hip pain.  EXAM: MRI OF THE LEFT HIP WITHOUT CONTRAST  TECHNIQUE: Multiplanar, multisequence MR imaging was performed. No intravenous contrast was administered.  COMPARISON:  Plain films left hip 01/22/2013.  FINDINGS: The  hips are located. There is no hip fracture. The pelvis is also intact. No hip joint effusion is present. The patient has mild to moderate bilateral hip degenerative change more notable on the left. There is some fluid in the trochanteric bursa bilaterally, worse on the left, consistent with trochanteric bursitis. There is some mild edema about the left gluteus medius at the musculotendinous junction compatible with sprain. No tear is identified. Pelvic musculature is otherwise unremarkable. Degenerative disease of lower lumbar spine is noted and described on dedicated report of lumbar spine MRI this same day.  IMPRESSION: 1. No acute finding. Negative for fracture.  2. Mild strain of the gluteus medius on the left without frank tear.  3. Small amount of fluid in the trochanteric bursa, greater on the left, compatible with bursitis.  4. Mild to moderate bilateral hip degenerative change, worse on the left.   Electronically Signed   By: Drusilla Kanner M.D.   On: 01/30/2013 18:17   Dg Knee Complete 4 Views Left  01/30/2013   *RADIOLOGY REPORT*  Clinical Data: Left leg pain following a fall.  LEFT KNEE - COMPLETE 4+ VIEW  Comparison: 01/22/2013.  Findings: Anatomic alignment the left knee.  No fracture or hardware complication identified.  Uncomplicated left three-part total knee arthroplasty.  IMPRESSION: No acute abnormality.  Uncomplicated left three-part total knee arthroplasty.   Original Report Authenticated By: Andreas Newport, M.D.    MDM   1. Hip pain, left   2. Muscle strain of gluteal region, left, initial encounter    Dr. Manus Gunning saw patient as well. Pt not currently having any pain and ambulated to the restroom on his own.  His MRI shows some pathology for weakness and pain in the left leg. I discussed with Dr. Jordan Likes who will see patient in the office something in the next couple of days. Pt also has a gluteal strain which may also be the cause of his pain being worse.  Discussed with Dr.  Manus Gunning prior to discharge,   79 y.o.Burns Pigman's evaluation in the Emergency Department is complete. It  has been determined that no acute conditions requiring further emergency intervention are present at this time. The patient/guardian have been advised of the diagnosis and plan. We have discussed signs and symptoms that warrant return to the ED, such as changes or worsening in symptoms.  Vital signs are stable at discharge. Filed Vitals:   01/30/13 1215  BP: 150/82  Pulse: 76  Temp: 97.8 F (36.6 C)  Resp: 18    Patient/guardian has voiced understanding and agreed to follow-up with the PCP or specialist.     Dorthula Matas, PA-C 01/30/13 1847

## 2013-01-30 NOTE — ED Provider Notes (Signed)
Medical screening examination/treatment/procedure(s) were conducted as a shared visit with non-physician practitioner(s) and myself.  I personally evaluated the patient during the encounter  Acute and chronic left leg pain with recurrent falls leg weakness that is intermittent. Denies head injury back pain, fever, vomiting. 4/5 strength in left lower extremity, 5/5 strength in R lower extremity. Ankle plantar and dorsiflexion intact. Great toe extension intact bilaterally. +2 DP and PT pulses. +2 patellar reflexes bilaterally. Normal gait. Full Range of motion of left hip without pain.  Glynn Octave, MD 01/30/13 [phone]

## 2013-01-30 NOTE — ED Notes (Signed)
Pt reports hx of back pain and problems with left leg giving out on him and having falls. Has been to a back dr and given injections but is now having to switch drs and having increase in falls again and pain to left leg. This am also having n/v.

## 2013-02-01 DIAGNOSIS — M79609 Pain in unspecified limb: Secondary | ICD-10-CM | POA: Diagnosis not present

## 2013-02-01 DIAGNOSIS — Z96659 Presence of unspecified artificial knee joint: Secondary | ICD-10-CM | POA: Diagnosis not present

## 2013-02-02 DIAGNOSIS — M47817 Spondylosis without myelopathy or radiculopathy, lumbosacral region: Secondary | ICD-10-CM | POA: Diagnosis not present

## 2013-02-02 DIAGNOSIS — M5126 Other intervertebral disc displacement, lumbar region: Secondary | ICD-10-CM | POA: Diagnosis not present

## 2013-02-07 ENCOUNTER — Other Ambulatory Visit: Payer: Self-pay | Admitting: Neurosurgery

## 2013-02-12 ENCOUNTER — Encounter (HOSPITAL_COMMUNITY): Payer: Self-pay | Admitting: Pharmacy Technician

## 2013-02-13 DIAGNOSIS — S72143A Displaced intertrochanteric fracture of unspecified femur, initial encounter for closed fracture: Secondary | ICD-10-CM | POA: Diagnosis not present

## 2013-02-13 DIAGNOSIS — R52 Pain, unspecified: Secondary | ICD-10-CM | POA: Diagnosis not present

## 2013-02-13 DIAGNOSIS — W010XXA Fall on same level from slipping, tripping and stumbling without subsequent striking against object, initial encounter: Secondary | ICD-10-CM | POA: Diagnosis not present

## 2013-02-13 DIAGNOSIS — N189 Chronic kidney disease, unspecified: Secondary | ICD-10-CM | POA: Diagnosis not present

## 2013-02-13 DIAGNOSIS — S72009A Fracture of unspecified part of neck of unspecified femur, initial encounter for closed fracture: Secondary | ICD-10-CM | POA: Diagnosis not present

## 2013-02-13 DIAGNOSIS — I129 Hypertensive chronic kidney disease with stage 1 through stage 4 chronic kidney disease, or unspecified chronic kidney disease: Secondary | ICD-10-CM | POA: Diagnosis not present

## 2013-02-13 DIAGNOSIS — M25559 Pain in unspecified hip: Secondary | ICD-10-CM | POA: Diagnosis not present

## 2013-02-13 NOTE — Pre-Procedure Instructions (Signed)
[name]  02/13/2013   Your procedure is scheduled on:  02/16/13  Report to [facility] short stay admitting at 530  AM.  Call this number if you have problems the morning of surgery: [phone]   Remember:   Do not eat food or drink liquids after midnight.   Take these medicines the morning of surgery with A SIP OF WATER: allopurinol, amlodipine, gabpentin, flomax, pain med if needed             STOP ibuprofen now   Do not wear jewelry, make-up or nail polish.  Do not wear lotions, powders, or perfumes. You may wear deodorant.  Do not shave 48 hours prior to surgery. Men may shave face and neck.  Do not bring valuables to the hospital.  [facility] is not responsible                  for any belongings or valuables.               Contacts, dentures or bridgework may not be worn into surgery.  Leave suitcase in the car. After surgery it may be brought to your room.  For patients admitted to the hospital, discharge time is determined by your                treatment team.               Patients discharged the day of surgery will not be allowed to drive  home.  Name and phone number of your driver:   Special Instructions: Shower using CHG 2 nights before surgery and the night before surgery.  If you shower the day of surgery use CHG.  Use special wash - you have one bottle of CHG for all showers.  You should use approximately 1/3 of the bottle for each shower.   Please read over the following fact sheets that you were given: Pain Booklet, Coughing and Deep Breathing, MRSA Information and Surgical Site Infection Prevention

## 2013-02-14 ENCOUNTER — Inpatient Hospital Stay (HOSPITAL_COMMUNITY)
Admission: AD | Admit: 2013-02-14 | Discharge: 2013-02-20 | DRG: 481 | Disposition: A | Discharge status: Skilled Nursing Facility | Payer: Medicare Other | Source: Other Acute Inpatient Hospital | Attending: Internal Medicine | Admitting: Internal Medicine

## 2013-02-14 ENCOUNTER — Other Ambulatory Visit: Payer: Self-pay

## 2013-02-14 ENCOUNTER — Inpatient Hospital Stay (HOSPITAL_COMMUNITY)
Admission: RE | Admit: 2013-02-14 | Discharge: 2013-02-14 | Disposition: A | Discharge status: Home / Self Care | Payer: Medicare Other | Source: Ambulatory Visit

## 2013-02-14 ENCOUNTER — Encounter (HOSPITAL_COMMUNITY): Payer: Self-pay | Admitting: *Deleted

## 2013-02-14 ENCOUNTER — Inpatient Hospital Stay (HOSPITAL_COMMUNITY): Payer: Medicare Other

## 2013-02-14 DIAGNOSIS — M25459 Effusion, unspecified hip: Secondary | ICD-10-CM | POA: Diagnosis not present

## 2013-02-14 DIAGNOSIS — S72009D Fracture of unspecified part of neck of unspecified femur, subsequent encounter for closed fracture with routine healing: Secondary | ICD-10-CM | POA: Diagnosis not present

## 2013-02-14 DIAGNOSIS — M48061 Spinal stenosis, lumbar region without neurogenic claudication: Secondary | ICD-10-CM

## 2013-02-14 DIAGNOSIS — G709 Myoneural disorder, unspecified: Secondary | ICD-10-CM | POA: Insufficient documentation

## 2013-02-14 DIAGNOSIS — S72141A Displaced intertrochanteric fracture of right femur, initial encounter for closed fracture: Secondary | ICD-10-CM

## 2013-02-14 DIAGNOSIS — F411 Generalized anxiety disorder: Secondary | ICD-10-CM | POA: Diagnosis present

## 2013-02-14 DIAGNOSIS — G4730 Sleep apnea, unspecified: Secondary | ICD-10-CM | POA: Diagnosis present

## 2013-02-14 DIAGNOSIS — K5730 Diverticulosis of large intestine without perforation or abscess without bleeding: Secondary | ICD-10-CM | POA: Diagnosis not present

## 2013-02-14 DIAGNOSIS — M25559 Pain in unspecified hip: Secondary | ICD-10-CM | POA: Diagnosis not present

## 2013-02-14 DIAGNOSIS — S72009A Fracture of unspecified part of neck of unspecified femur, initial encounter for closed fracture: Secondary | ICD-10-CM | POA: Diagnosis not present

## 2013-02-14 DIAGNOSIS — M6281 Muscle weakness (generalized): Secondary | ICD-10-CM | POA: Diagnosis not present

## 2013-02-14 DIAGNOSIS — K5989 Other specified functional intestinal disorders: Secondary | ICD-10-CM | POA: Diagnosis not present

## 2013-02-14 DIAGNOSIS — R141 Gas pain: Secondary | ICD-10-CM | POA: Diagnosis not present

## 2013-02-14 DIAGNOSIS — K560 Paralytic ileus: Secondary | ICD-10-CM | POA: Diagnosis not present

## 2013-02-14 DIAGNOSIS — M4800 Spinal stenosis, site unspecified: Secondary | ICD-10-CM | POA: Diagnosis not present

## 2013-02-14 DIAGNOSIS — G629 Polyneuropathy, unspecified: Secondary | ICD-10-CM | POA: Insufficient documentation

## 2013-02-14 DIAGNOSIS — M24659 Ankylosis, unspecified hip: Secondary | ICD-10-CM | POA: Diagnosis present

## 2013-02-14 DIAGNOSIS — M169 Osteoarthritis of hip, unspecified: Secondary | ICD-10-CM | POA: Diagnosis not present

## 2013-02-14 DIAGNOSIS — S72109A Unspecified trochanteric fracture of unspecified femur, initial encounter for closed fracture: Secondary | ICD-10-CM | POA: Diagnosis not present

## 2013-02-14 DIAGNOSIS — R0989 Other specified symptoms and signs involving the circulatory and respiratory systems: Secondary | ICD-10-CM | POA: Diagnosis not present

## 2013-02-14 DIAGNOSIS — G4720 Circadian rhythm sleep disorder, unspecified type: Secondary | ICD-10-CM | POA: Diagnosis not present

## 2013-02-14 DIAGNOSIS — I10 Essential (primary) hypertension: Secondary | ICD-10-CM | POA: Diagnosis not present

## 2013-02-14 DIAGNOSIS — K567 Ileus, unspecified: Secondary | ICD-10-CM

## 2013-02-14 DIAGNOSIS — Z5189 Encounter for other specified aftercare: Secondary | ICD-10-CM | POA: Diagnosis not present

## 2013-02-14 DIAGNOSIS — C801 Malignant (primary) neoplasm, unspecified: Secondary | ICD-10-CM

## 2013-02-14 DIAGNOSIS — G609 Hereditary and idiopathic neuropathy, unspecified: Secondary | ICD-10-CM | POA: Diagnosis not present

## 2013-02-14 DIAGNOSIS — F329 Major depressive disorder, single episode, unspecified: Secondary | ICD-10-CM | POA: Diagnosis present

## 2013-02-14 DIAGNOSIS — R262 Difficulty in walking, not elsewhere classified: Secondary | ICD-10-CM | POA: Diagnosis not present

## 2013-02-14 DIAGNOSIS — R339 Retention of urine, unspecified: Secondary | ICD-10-CM | POA: Diagnosis not present

## 2013-02-14 DIAGNOSIS — S7223XA Displaced subtrochanteric fracture of unspecified femur, initial encounter for closed fracture: Secondary | ICD-10-CM | POA: Diagnosis not present

## 2013-02-14 DIAGNOSIS — K3189 Other diseases of stomach and duodenum: Secondary | ICD-10-CM | POA: Diagnosis not present

## 2013-02-14 DIAGNOSIS — N400 Enlarged prostate without lower urinary tract symptoms: Secondary | ICD-10-CM | POA: Diagnosis not present

## 2013-02-14 DIAGNOSIS — N401 Enlarged prostate with lower urinary tract symptoms: Secondary | ICD-10-CM | POA: Diagnosis not present

## 2013-02-14 DIAGNOSIS — S72143A Displaced intertrochanteric fracture of unspecified femur, initial encounter for closed fracture: Secondary | ICD-10-CM

## 2013-02-14 DIAGNOSIS — IMO0002 Reserved for concepts with insufficient information to code with codable children: Secondary | ICD-10-CM | POA: Diagnosis not present

## 2013-02-14 DIAGNOSIS — R296 Repeated falls: Secondary | ICD-10-CM | POA: Diagnosis not present

## 2013-02-14 DIAGNOSIS — N179 Acute kidney failure, unspecified: Secondary | ICD-10-CM | POA: Diagnosis not present

## 2013-02-14 DIAGNOSIS — M159 Polyosteoarthritis, unspecified: Secondary | ICD-10-CM | POA: Diagnosis not present

## 2013-02-14 DIAGNOSIS — G589 Mononeuropathy, unspecified: Secondary | ICD-10-CM | POA: Diagnosis present

## 2013-02-14 HISTORY — DX: Major depressive disorder, single episode, unspecified: F32.9

## 2013-02-14 HISTORY — DX: Unspecified osteoarthritis, unspecified site: M19.90

## 2013-02-14 HISTORY — DX: Displaced intertrochanteric fracture of right femur, initial encounter for closed fracture: S72.141A

## 2013-02-14 HISTORY — DX: Myoneural disorder, unspecified: G70.9

## 2013-02-14 HISTORY — DX: Depression, unspecified: F32.A

## 2013-02-14 HISTORY — DX: Spinal stenosis, lumbar region without neurogenic claudication: M48.061

## 2013-02-14 HISTORY — DX: Malignant (primary) neoplasm, unspecified: C80.1

## 2013-02-14 LAB — CBC
Hemoglobin: 13 g/dL (ref 13.0–17.0)
MCHC: 34.9 g/dL (ref 30.0–36.0)
Platelets: 164 10*3/uL (ref 150–400)
RDW: 14.1 % (ref 11.5–15.5)

## 2013-02-14 LAB — SURGICAL PCR SCREEN
MRSA, PCR: NEGATIVE
Staphylococcus aureus: NEGATIVE

## 2013-02-14 LAB — BASIC METABOLIC PANEL
Calcium: 9.2 mg/dL (ref 8.4–10.5)
GFR calc Af Amer: 53 mL/min — ABNORMAL LOW (ref >90)
GFR calc non Af Amer: 46 mL/min — ABNORMAL LOW (ref >90)
Potassium: 3.9 mEq/L (ref 3.5–5.1)
Sodium: 135 mEq/L (ref 135–145)

## 2013-02-14 MED ORDER — IRBESARTAN 300 MG PO TABS
300.0000 mg | ORAL_TABLET | Freq: Every morning | ORAL | Status: DC
  Administered 2013-02-14 – 2013-02-15 (×2): 300 mg via ORAL
  Filled 2013-02-14 (×3): qty 1

## 2013-02-14 MED ORDER — CYANOCOBALAMIN 500 MCG PO TABS
500.0000 ug | ORAL_TABLET | Freq: Every day | ORAL | Status: DC
  Administered 2013-02-14: 16:00:00 500 ug via ORAL
  Administered 2013-02-15: 1000 ug via ORAL
  Filled 2013-02-14 (×3): qty 1

## 2013-02-14 MED ORDER — MORPHINE SULFATE 2 MG/ML IJ SOLN: 0.5000 mg | INTRAMUSCULAR | Status: DC | PRN

## 2013-02-14 MED ORDER — HYDROCODONE-ACETAMINOPHEN 5-325 MG PO TABS: 1.0000 | ORAL_TABLET | Freq: Four times a day (QID) | ORAL | Status: DC | PRN

## 2013-02-14 MED ORDER — POLYSACCHARIDE IRON COMPLEX 150 MG PO CAPS
150.0000 mg | ORAL_CAPSULE | Freq: Every day | ORAL | Status: DC
  Administered 2013-02-14 – 2013-02-15 (×2): 150 mg via ORAL
  Filled 2013-02-14 (×3): qty 1

## 2013-02-14 MED ORDER — SODIUM CHLORIDE 0.9 % IV SOLN
INTRAVENOUS | Status: DC
  Administered 2013-02-14 – 2013-02-15 (×2): via INTRAVENOUS

## 2013-02-14 MED ORDER — DEXAMETHASONE SODIUM PHOSPHATE 10 MG/ML IJ SOLN: 10.0000 mg | INTRAMUSCULAR | Status: DC

## 2013-02-14 MED ORDER — GABAPENTIN 600 MG PO TABS
600.0000 mg | ORAL_TABLET | Freq: Two times a day (BID) | ORAL | Status: DC
  Administered 2013-02-14 – 2013-02-15 (×3): 600 mg via ORAL
  Filled 2013-02-14 (×6): qty 1

## 2013-02-14 MED ORDER — AMLODIPINE BESYLATE 10 MG PO TABS
10.0000 mg | ORAL_TABLET | Freq: Every morning | ORAL | Status: DC
  Administered 2013-02-14 – 2013-02-15 (×2): 10 mg via ORAL
  Filled 2013-02-14 (×3): qty 1

## 2013-02-14 MED ORDER — FERROUS FUM-IRON POLYSACCH 162-115.2 MG PO CAPS: 1.0000 | ORAL_CAPSULE | Freq: Every day | ORAL | Status: DC

## 2013-02-14 MED ORDER — ZOLPIDEM TARTRATE 5 MG PO TABS
5.0000 mg | ORAL_TABLET | Freq: Every day | ORAL | Status: DC
  Administered 2013-02-14 – 2013-02-15 (×2): 5 mg via ORAL
  Filled 2013-02-14 (×2): qty 1

## 2013-02-14 MED ORDER — CEFAZOLIN SODIUM 10 G IJ SOLR
3.0000 g | INTRAMUSCULAR | Status: DC
  Filled 2013-02-14 (×2): qty 3000

## 2013-02-14 MED ORDER — ASPIRIN EC 325 MG PO TBEC
325.0000 mg | DELAYED_RELEASE_TABLET | Freq: Every day | ORAL | Status: DC
  Filled 2013-02-14: qty 1

## 2013-02-14 MED ORDER — HYDROMORPHONE HCL PF 1 MG/ML IJ SOLN
1.0000 mg | INTRAMUSCULAR | Status: DC | PRN
  Administered 2013-02-14 – 2013-02-18 (×12): 1 mg via INTRAVENOUS
  Filled 2013-02-14 (×12): qty 1

## 2013-02-14 MED ORDER — ADULT MULTIVITAMIN W/MINERALS CH
1.0000 | ORAL_TABLET | Freq: Every day | ORAL | Status: DC
  Administered 2013-02-14 – 2013-02-15 (×2): 1 via ORAL
  Filled 2013-02-14 (×3): qty 1

## 2013-02-14 MED ORDER — CEFAZOLIN SODIUM-DEXTROSE 2-3 GM-% IV SOLR
2.0000 g | Freq: Once | INTRAVENOUS | Status: AC
  Administered 2013-02-15: 2 g via INTRAVENOUS
  Filled 2013-02-14: qty 50

## 2013-02-14 MED ORDER — CHLORHEXIDINE GLUCONATE 4 % EX LIQD
60.0000 mL | Freq: Once | CUTANEOUS | Status: AC
  Administered 2013-02-14: 4 via TOPICAL
  Filled 2013-02-14: qty 60

## 2013-02-14 MED ORDER — TAMSULOSIN HCL 0.4 MG PO CAPS
0.4000 mg | ORAL_CAPSULE | Freq: Every day | ORAL | Status: DC
  Administered 2013-02-14 – 2013-02-15 (×2): 0.4 mg via ORAL
  Filled 2013-02-14 (×4): qty 1

## 2013-02-14 MED ORDER — MORPHINE SULFATE 2 MG/ML IJ SOLN
1.0000 mg | Freq: Once | INTRAMUSCULAR | Status: DC
  Filled 2013-02-14: qty 1

## 2013-02-14 NOTE — Progress Notes (Deleted)
Late Entry 0545,pt complained of not being able to pee,DR Julian Reil gave order to insert foley.Attempted to insert 14 french catheter without success per hospital policy.pt tolerated fair,unable  To pass catheter due to resistance.DR Julian Reil aware see physicians orders,will continue to monitor.

## 2013-02-14 NOTE — Progress Notes (Signed)
Brief Nutrition Note:  RD pulled to pt for hip fracture protocol.  RD discussed intake and appetite with pt. Denied any recent weight loss or decreased in appetite. Body mass index is 33.06 kg/(m^2). obesity class 1.  Expect good appetite post op.   Chart reviewed no nutrition interventions warranted at this time. Please re-consult as needed.   Isabell Jarvis RD, LDN Pager [phone] After Hours pager [phone]

## 2013-02-14 NOTE — Progress Notes (Signed)
Nursing Note: Attempted to insert 16 Fr coude catheter without success per hospital policy.Pt tolerated fair. Unable to pass catheter,was able to get past first area then met resistance and unable to advance catheter.Pt's nurse at bedside and is aware.Pt has urology consult per his nurse.Ripley Bogosian b Kyion Gautier,rn

## 2013-02-14 NOTE — Progress Notes (Signed)
Utilization review complete. [name] RN CCM Case Mgmt  

## 2013-02-14 NOTE — Consult Note (Signed)
NAME: [name] MRN:   [id number] DOB:   [birth year]     HISTORY AND PHYSICAL  CHIEF COMPLAINT:  Right hip pain  HISTORY:   [name] a 79 y.o. male  who suffered a fall today due to L leg weakness when he raised his R leg to step on a spider. He has been suffering L leg weakness for some weeks now secondary to lumbar spinal stenosis, he had been planning on going to OR with Dr. Jordan Likes on Friday this week for Lumbar procedure. Unfortunately his L leg gave out on him and he fell, landing on his R hip. His R hip was painful, and unable to bear weight. In the ED at [facility] he was found to have a R intertrochanteric hip fracture with displacement. His orthopaedic surgeon of choice Dr. Sherlean Foot happens to be on call for [facility] tonight and so was contacted, patient [facility].  PAST MEDICAL HISTORY:   Past Medical History  Diagnosis Date  . Hypertension   . Neuropathy   . Enlarged prostate   . Anxiety   . Depression   . Sleep apnea   . Neuromuscular disorder 02/14/13    damnage nerves in bil feet  . Cancer 02/14/13    skin cancer  . Arthritis     PAST SURGICAL HISTORY:   Past Surgical History  Procedure Laterality Date  . Joint replacement      MEDICATIONS:   Medications Prior to Admission  Medication Sig Dispense Refill  . acetaminophen (TYLENOL) 500 MG tablet Take 1,000 mg by mouth every 6 (six) hours as needed for pain.      Marland Kitchen allopurinol (ZYLOPRIM) 300 MG tablet Take 300 mg by mouth every morning.      Marland Kitchen amLODipine (NORVASC) 10 MG tablet Take 10 mg by mouth every morning.      . cyanocobalamin 500 MCG tablet Take 500 mcg by mouth daily.      . ferrous fumarate-iron polysaccharide complex (TANDEM) 162-115.2 MG CAPS Take 1 capsule by mouth daily with breakfast.      . gabapentin (NEURONTIN) 600 MG tablet Take 600 mg by mouth 2 (two) times daily.      Marland Kitchen ibuprofen (ADVIL,MOTRIN) 200 MG tablet Take 400 mg by mouth every 6 (six) hours as needed for pain.       Marland Kitchen irbesartan (AVAPRO) 300 MG tablet Take 300 mg by mouth every morning.      . Multiple Vitamin (MULTIVITAMIN WITH MINERALS) TABS tablet Take 1 tablet by mouth daily.      Marland Kitchen oxyCODONE-acetaminophen (PERCOCET) 10-325 MG per tablet Take 1 tablet by mouth every 4 (four) hours as needed for pain.      . tamsulosin (FLOMAX) 0.4 MG CAPS capsule Take 0.4 mg by mouth daily after breakfast.      . zolpidem (AMBIEN) 10 MG tablet Take 10 mg by mouth at bedtime.        ALLERGIES:  No Known Allergies  REVIEW OF SYSTEMS:   Negative except HPI  FAMILY HISTORY:  History reviewed. No pertinent family history.  SOCIAL HISTORY:   reports that he has never smoked. He does not have any smokeless tobacco history on file. He reports that he does not drink alcohol or use illicit drugs.  PHYSICAL EXAM:  General appearance: alert, cooperative and no distress Resp: clear to auscultation bilaterally Cardio: regular rate and rhythm, S1, S2 normal, no murmur, click, rub or gallop GI: soft, non-tender; bowel sounds normal; no  masses,  no organomegaly Extremities: Homans sign is negative, no sign of DVT Pulses: 2+ and symmetric Skin: Skin color, texture, turgor normal. No rashes or lesions Neurologic: Alert and oriented X 3, normal strength and tone. Normal symmetric reflexes. Normal coordination and gait  R leg shortened and rotated   LABORATORY STUDIES: No results found for this basename: WBC, HGB, HCT, PLT,  in the last 72 hours  No results found for this basename: NA, K, CL, CO2, GLUCOSE, BUN, CREATININE, CALCIUM,  in the last 72 hours  STUDIES/RESULTS:  Mr Lumbar Spine Wo Contrast  01/30/2013   *RADIOLOGY REPORT*  Clinical Data: Back pain with left leg giving out.  MRI LUMBAR SPINE WITHOUT CONTRAST  Multiplanar and multiecho pulse sequences of the lumbar spine were obtained without intravenous contrast.  Comparison: 10/27/2011 MR.  Findings: Last fully open disc space is labeled L5-S1.  Present examination  incorporates from T10-11 and disc space through the lower S2 level.  Conus L1 level.  Probable hemangioma within left aspect of the T11 vertebral body without change.  Visualized paravertebral structures without worrisome abnormality. Dependent subcutaneous edema has progressed since prior exam.  T10-11 through T12-L1 unremarkable.  L1-2:  Facet joint degenerative changes.  Bulge slightly greater right lateral position.  No compression of the exiting nerve root. Very mild spinal stenosis.  L2-3:  Marked disc degeneration with broad-based disc osteophyte complex with slight retrolisthesis of L2.  Facet joint degenerative changes.  Multifactorial moderate spinal stenosis.  The right lateral extension of disc/osteophyte approaches but does not cause significant compression of the exiting right L2 nerve root.  L3-4:  Large broad-based disc protrusion with greatest extension centrally and left lateral position. Mass effect upon the exiting left L3 nerve root.  Facet joint degenerative changes.  Ligamentum flavum hypertrophy.  Short pedicles.  Severe spinal stenosis.  L4-5:  Broad-based disc osteophyte complex.  Facet joint degenerative changes.  Ligamentum flavum hypertrophy.  Short pedicles.  Severe spinal stenosis.  Moderate foraminal narrowing greater on the left with encroachment upon the exiting L4 nerve roots greater on the left.  L5-S1:  Prominent facet joint degenerative changes.  Bulge/broad- based protrusion.  Centrally this contributes to the moderate left- sided lateral recess stenosis and mild spinal stenosis.  Bilateral foramen greater left without nerve root compression.  IMPRESSION: Progressive degenerative changes since the prior examination with findings most notable L3-4 and L4-5 level. Summary of pertinent findings includes:  L1-2 very mild spinal stenosis.  L2-3 multifactorial moderate spinal stenosis.  Right lateral extension of disc/osteophyte approaches but does not cause significant compression  of the exiting right L2 nerve root.  L3-4 large broad-based disc protrusion with greatest extension centrally and left lateral position. Mass effect upon the exiting left L3 nerve root. Multifactorial severe spinal stenosis.  L4-5 multifactorial severe spinal stenosis.  Moderate foraminal narrowing greater on the left with encroachment upon the exiting L4 nerve roots greater on the left.  L5-S1 moderate left lateral recess stenosis and mild spinal stenosis.  Bilateral foramen greater on the left without nerve root compression.   Original Report Authenticated By: Lacy Duverney, M.D.   Mr Hip Left Wo Contrast  01/30/2013   CLINICAL DATA:  History of falls. Left hip pain.  EXAM: MRI OF THE LEFT HIP WITHOUT CONTRAST  TECHNIQUE: Multiplanar, multisequence MR imaging was performed. No intravenous contrast was administered.  COMPARISON:  Plain films left hip 01/22/2013.  FINDINGS: The hips are located. There is no hip fracture. The pelvis is also intact.  No hip joint effusion is present. The patient has mild to moderate bilateral hip degenerative change more notable on the left. There is some fluid in the trochanteric bursa bilaterally, worse on the left, consistent with trochanteric bursitis. There is some mild edema about the left gluteus medius at the musculotendinous junction compatible with sprain. No tear is identified. Pelvic musculature is otherwise unremarkable. Degenerative disease of lower lumbar spine is noted and described on dedicated report of lumbar spine MRI this same day.  IMPRESSION: 1. No acute finding. Negative for fracture.  2. Mild strain of the gluteus medius on the left without frank tear.  3. Small amount of fluid in the trochanteric bursa, greater on the left, compatible with bursitis.  4. Mild to moderate bilateral hip degenerative change, worse on the left.   Electronically Signed   By: Drusilla Kanner M.D.   On: 01/30/2013 18:17   Dg Knee Complete 4 Views Left  01/30/2013   *RADIOLOGY  REPORT*  Clinical Data: Left leg pain following a fall.  LEFT KNEE - COMPLETE 4+ VIEW  Comparison: 01/22/2013.  Findings: Anatomic alignment the left knee.  No fracture or hardware complication identified.  Uncomplicated left three-part total knee arthroplasty.  IMPRESSION: No acute abnormality.  Uncomplicated left three-part total knee arthroplasty.   Original Report Authenticated By: Andreas Newport, M.D.    ASSESSMENT:  Right hip intertrochateric fracture        Principal Problem:   Displaced intertrochanteric fracture of right femur Active Problems:   Spinal stenosis of lumbar region    PLAN:  Right IM nail today   Temperence Zenor 02/14/2013. 7:05 AM

## 2013-02-14 NOTE — H&P (Signed)
[facility] History and Physical  Corion Sherrod ZOX:[id number] DOB: 1932-01-04 DOA: 02/14/2013  Referring physician: [facility] ED PCP: No primary provider on file.   Chief Complaint: Fall, R hip pain  HPI: [name] is a 79 y.o. male who suffered a fall today due to L leg weakness when he raised his R leg to step on a spider.  He has been suffering L leg weakness for some weeks now secondary to lumbar spinal stenosis, he had been planning on going to OR with Dr. Jordan Likes on Friday this week for Lumbar procedure.  Unfortunately his L leg gave out on him and he fell, landing on his R hip.  His R hip was painful, and unable to bear weight.  In the ED at [facility] he was found to have a R intertrochanteric hip fracture with displacement.  His orthopaedic surgeon of choice Dr. Sherlean Foot happens to be on call for [facility] tonight and so was contacted, patient transferred [facility].  Review of Systems: Patient very active at baseline, no chest pain nor SOB, 12 systems reviewed and otherwise negative.  Past Medical History  Diagnosis Date  . Hypertension   . Neuropathy   . Enlarged prostate   . Anxiety   . Depression   . Sleep apnea   . Neuromuscular disorder 02/14/13    damnage nerves in bil feet  . Cancer 02/14/13    skin cancer  . Arthritis    Past Surgical History  Procedure Laterality Date  . Joint replacement     Social History:  reports that he has never smoked. He does not have any smokeless tobacco history on file. He reports that he does not drink alcohol or use illicit drugs.  No Known Allergies  History reviewed. No pertinent family history.  Prior to Admission medications   Medication Sig Start Date End Date Taking? Authorizing Provider  acetaminophen (TYLENOL) 500 MG tablet Take 1,000 mg by mouth every 6 (six) hours as needed for pain.    Historical Provider, MD  allopurinol (ZYLOPRIM) 300 MG tablet Take 300 mg by mouth every morning.    Historical Provider, MD   amLODipine (NORVASC) 10 MG tablet Take 10 mg by mouth every morning.    Historical Provider, MD  cyanocobalamin 500 MCG tablet Take 500 mcg by mouth daily.    Historical Provider, MD  ferrous fumarate-iron polysaccharide complex (TANDEM) 162-115.2 MG CAPS Take 1 capsule by mouth daily with breakfast.    Historical Provider, MD  gabapentin (NEURONTIN) 600 MG tablet Take 600 mg by mouth 2 (two) times daily.    Historical Provider, MD  ibuprofen (ADVIL,MOTRIN) 200 MG tablet Take 400 mg by mouth every 6 (six) hours as needed for pain.    Historical Provider, MD  irbesartan (AVAPRO) 300 MG tablet Take 300 mg by mouth every morning.    Historical Provider, MD  Multiple Vitamin (MULTIVITAMIN WITH MINERALS) TABS tablet Take 1 tablet by mouth daily.    Historical Provider, MD  oxyCODONE-acetaminophen (PERCOCET) 10-325 MG per tablet Take 1 tablet by mouth every 4 (four) hours as needed for pain.    Historical Provider, MD  tamsulosin (FLOMAX) 0.4 MG CAPS capsule Take 0.4 mg by mouth daily after breakfast.    Historical Provider, MD  zolpidem (AMBIEN) 10 MG tablet Take 10 mg by mouth at bedtime.    Historical Provider, MD   Physical Exam: Filed Vitals:   02/14/13 0033  BP: 159/74  Pulse: 87  Temp: 98.8 F (37.1  C)  Resp: 18    General:  NAD, resting comfortably in bed Eyes: PEERLA EOMI ENT: mucous membranes moist Neck: supple w/o JVD Cardiovascular: RRR w/o MRG Respiratory: CTA B Abdomen: soft, nt, nd, bs+ Skin: no rash nor lesion Musculoskeletal: MAE, R leg shortened and rotated Psychiatric: normal tone and affect Neurologic: AAOx3, grossly non-focal  Labs on Admission:  Basic Metabolic Panel: No results found for this basename: NA, K, CL, CO2, GLUCOSE, BUN, CREATININE, CALCIUM, MG, PHOS,  in the last 168 hours Liver Function Tests: No results found for this basename: AST, ALT, ALKPHOS, BILITOT, PROT, ALBUMIN,  in the last 168 hours No results found for this basename: LIPASE, AMYLASE,   in the last 168 hours No results found for this basename: AMMONIA,  in the last 168 hours CBC: No results found for this basename: WBC, NEUTROABS, HGB, HCT, MCV, PLT,  in the last 168 hours Cardiac Enzymes: No results found for this basename: CKTOTAL, CKMB, CKMBINDEX, TROPONINI,  in the last 168 hours  BNP (last 3 results) No results found for this basename: PROBNP,  in the last 8760 hours CBG: No results found for this basename: GLUCAP,  in the last 168 hours  Radiological Exams on Admission: No results found.  EKG: Ordered and pending.  Assessment/Plan Principal Problem:   Displaced intertrochanteric fracture of right femur Active Problems:   Spinal stenosis of lumbar region   1. Displaced intertrochanteric fracture of R femur - plan for OR today, ordered pre-op EKG for clearance, but patient reports no cardiac symptoms and a high degree of activity at baseline.  Have also ordered CBC and BMP for screening labs as well, only abnormality worth watching on labs from [facility] was his creatinine of 1.7. 2. AKI vs CKD - creatinine of 1.7 at [facility], up from 1.3 earlier this month, suspicious that this may be secondary to urinary retention from BPH, patient needs to go to bathroom he says but is unable to right now, ordering foley and repeating BMP with our lab. 3. Spinal stenosis of lumbar region - unfortunately patient unlikely to go to OR on Friday as originally planned due to issue #1 (neurosurgery informed, see below) 4. HTN - continue home meds.  Formally consulted Dr. Sherlean Foot who will try and get patient on schedule for OR for surgery on hip at 1 or 2 pm or so today.  Informally spoke with Dr. Jordan Likes who happened to be neurosurgeon on call this evening, and informed him of patients admission and resulting change in circumstance (it sounds like patient is now not likely to go to OR for lumbar surgery on Friday).  Code Status: Full Code (must indicate code status--if unknown or must be  presumed, indicate so) Family Communication: No family in room (indicate person spoken with, if applicable, with phone number if by telephone) Disposition Plan: Admit to inpatient (indicate anticipated LOS)  Time spent: 70 min  GARDNER, JARED M. [facility] Pager [phone]  If 7PM-7AM, please contact night-coverage [email or web address] Password [email or web address] 02/14/2013, 6:09 AM

## 2013-02-14 NOTE — Care Management Note (Signed)
   CARE MANAGEMENT NOTE 02/14/2013  Patient:  [name], [name]   Account Number:  [id number]  Date Initiated:  02/14/2013  Documentation initiated by:  Hubert Raatz  Subjective/Objective Assessment:   CM referral for  pt with hip fx, awaiting surgery.     Action/Plan:   Await surgery and PT eval for d/c needs.   Anticipated DC Date:  02/17/2013   Anticipated DC Plan:           Choice offered to / List presented to:             Status of service:  In process, will continue to follow Medicare Important Message given?   (If response is "NO", the following Medicare IM given date fields will be blank) Date Medicare IM given:   Date Additional Medicare IM given:    Discharge Disposition:    Per UR Regulation:    If discussed at Long Length of Stay Meetings, dates discussed:    Comments:

## 2013-02-14 NOTE — Progress Notes (Signed)
Orthopedic Tech Progress Note Patient Details:  [name] 14-Jan-1932 [id number]  Musculoskeletal Traction Type of Traction: Bucks Skin Traction Traction Location: RLE Traction Weight: 5 lbs    [name] 02/14/2013, 6:05 PM

## 2013-02-14 NOTE — Progress Notes (Addendum)
[facility] PROGRESS NOTE  [name] WGN:[id number] DOB: 1932/05/05 DOA: 02/14/2013 PCP: No primary provider on file.   HPI/Subjective: Patient is a 79yo white male who presented to the [facility] ED yesterday after sustaining a fall at his home.  PMH is significant for spinal stenosis causing lower extremity weakness in which was the cause for the fall.  In the ED, pt. was diagnosed with a displaced R intertrochanteric femoral Fx.  He was then transferred to [facility] for surgical repair with Dr. Valentina Gu this afternoon 10/1.  Spinal surgery was scheduled with Dr. Jordan Likes on Friday but is now postponed.    Assessment/Plan:  Right femur fracture w/ surgical repair: -Primary is Ortho-Dr. Valentina Gu -Pre-op labs and EKG completed -Creatinine slightly elevated from baseline of 1.3, likely due to urinary retention-- 1.7 today. -3g Ancef prophylactic for Sx -Sx scheduled for afternoon of 10/1    Spinal Stenosis: -Primary is Neruo- Dr. Jordan Likes -Surgery was scheduled for 10/3-- postponed now -Erie Noe (Dr. Lindalou Hose Nurse) was notified of current hospitalization and informed to cancel surgery until cleared by Ortho. [facility] Neurosurgery [phone])   HTN -Well controlled on home meds -Will continue home regimen while inpatient -Norvasc 10mg , Irbesartan 300mg -- once daily in morning. -BP meds held this am due to surgery this afternoon; resume tomorrow 10/2   BPH -Flomax .4mg   -Had complaints of urinary retention -Foley inserted by Urology after 3 unsuccessful attempts by nursing staff; good flow was reported after insertion. -Minimal blood noticed coming from urethra; not worrisome  -F/u with PCP post-op for better medical control of obstructive symptoms  Code Status: Full Family Communication: Wife at bedside. No questions or concerns at this time. Disposition Plan: Surgery this afternoon, inpatient/SNF for rehab post op.   Consultants:  Ortho  Procedures:  Intermedullary  Intertrochanteric Nail fixation of Right Intertrochanteric Hip Fx-- Dr. Valentina Gu-- see op note  Antibiotics:  3g Ancef one dose 10/1-- prophylactic for hip sx.    Objective: Filed Vitals:   02/14/13 0900  BP: 153/82  Pulse: 83  Temp: 98.5 F (36.9 C)  Resp: 18    Intake/Output Summary (Last 24 hours) at 02/14/13 1303 Last data filed at 02/14/13 0930  Gross per 24 hour  Intake      0 ml  Output   1600 ml  Net  -1600 ml   Filed Weights   02/14/13 0033  Weight: 104.5 kg (230 lb 6.1 oz)    Exam:   General:  Well appearing, awake, alert, oriented to time and place, in discomfort secondary to hip fracture  Cardiovascular: RRR, no murmur, gallop, rubs, Normal S1/S2, Cap refill 2+ in extremities; no peripheral edema noted  Respiratory: CTAB, no r/r/w. Symmetrical chest wall movement, no accessory muscle use.  Abdomen: Soft, non tender, no palpable masses, no distention, no organomegaly, +BS  Musculoskeletal: LE ROM not assessed due to hip fracture; UE ROM WNL in all joints.   Neruo: Sensation intact in all extremities, no focal deficits.  Data Reviewed: Basic Metabolic Panel:  Recent Labs Lab 02/14/13 0730  NA 135  K 3.9  CL 97  CO2 23  GLUCOSE 113*  BUN 17  CREATININE 1.41*  CALCIUM 9.2   CBC  Recent Labs Lab 02/14/13 0730  WBC 11.1*  HGB 13.0  HCT 37.3*  MCV 89.4  PLT 164     Studies: No results found.  Scheduled Meds: . amLODipine  10 mg Oral q morning - 10a  . aspirin EC  325 mg Oral Daily  .  ceFAZolin (ANCEF) IV  3 g Intravenous On Call to OR  . cyanocobalamin  500 mcg Oral Daily  . gabapentin  600 mg Oral BID  . irbesartan  300 mg Oral q morning - 10a  . iron polysaccharides  150 mg Oral Daily  . multivitamin with minerals  1 tablet Oral Daily  . tamsulosin  0.4 mg Oral QPC breakfast  . zolpidem  5 mg Oral QHS   Continuous Infusions: . sodium chloride 75 mL/hr at 02/14/13 1252    Principal Problem:   Displaced intertrochanteric  fracture of right femur Active Problems:   Spinal stenosis of lumbar region   Stevphen Meuse, Physician Assistant Student  Algis Downs, PA-C [phone] [facility] 02/14/2013, 1:03 PM  LOS: 0 days        Addendum  Patient seen and examined, chart and data base reviewed.  I agree with the above assessment and plan.  For full details please see Mrs. Algis Downs PA note.   Clint Lipps, MD [facility] Pager: [phone] 02/14/2013, 4:58 PM

## 2013-02-14 NOTE — Clinical Social Work Note (Signed)
CSW received consult for skilled facility placement. CSW will follow-up with patient and wife on 10/2 as patient having surgery today (was preparing to be taken to surgery when CSW visited room to assess).   Genelle Bal, MSW, LCSW [phone]

## 2013-02-14 NOTE — Progress Notes (Signed)
Late entry,pt complained of not being able to pee at 0530 am 02/14/13.Dr Julian Reil gave order to insert foley catheter.Attempted to insert 14 french catheter  without success per hospital policy.Pt tolerated fair,unable to pass catheter due to resistance.Dr Julian Reil aware, urologist consulted will continue to monitor

## 2013-02-14 NOTE — Progress Notes (Signed)
Orthopaedic Trauma Service Progress Note  Due to OR availability pt unable to go to OR today for IMN R hip. As such Dr. Carola Frost and the Orthopaedic Trauma Service will assume orthopaedic management of the pt and plan for OR tomorrow am.   New orders have been placed reflecting such Will have pt placed in traction for comfort, ok for traction not do be applied if pt currently comfortable Check R knee films pre-op   Assessment and Plan   79 y/o male s/p fall with R hip fx  1. Fall 2. R reverse obliquity type R hip fx   OR tomorrow for IMN  Continue bed rest for now  Ice PRN  Bucks traction- 5lbs  3. Medical issues  Per primary service  4. DVT/PE prophylaxis  SCDs for now  lovenox post op x 2 weeks  5. Dispo  OR tomorrow for IMN R hip   Mearl Latin, PA-C [facility] [phone] (P) 02/14/2013 2:06 PM

## 2013-02-15 ENCOUNTER — Inpatient Hospital Stay (HOSPITAL_COMMUNITY): Payer: Medicare Other

## 2013-02-15 ENCOUNTER — Encounter (HOSPITAL_COMMUNITY)
Admission: AD | Disposition: A | Discharge status: Skilled Nursing Facility | Payer: Self-pay | Source: Other Acute Inpatient Hospital | Attending: Internal Medicine

## 2013-02-15 ENCOUNTER — Encounter (HOSPITAL_COMMUNITY): Payer: Self-pay | Admitting: Anesthesiology

## 2013-02-15 ENCOUNTER — Inpatient Hospital Stay (HOSPITAL_COMMUNITY): Payer: Medicare Other | Admitting: Anesthesiology

## 2013-02-15 DIAGNOSIS — S72143A Displaced intertrochanteric fracture of unspecified femur, initial encounter for closed fracture: Secondary | ICD-10-CM | POA: Diagnosis not present

## 2013-02-15 DIAGNOSIS — N179 Acute kidney failure, unspecified: Secondary | ICD-10-CM | POA: Diagnosis not present

## 2013-02-15 DIAGNOSIS — K560 Paralytic ileus: Secondary | ICD-10-CM | POA: Diagnosis not present

## 2013-02-15 DIAGNOSIS — IMO0002 Reserved for concepts with insufficient information to code with codable children: Secondary | ICD-10-CM | POA: Diagnosis not present

## 2013-02-15 DIAGNOSIS — I10 Essential (primary) hypertension: Secondary | ICD-10-CM | POA: Diagnosis not present

## 2013-02-15 DIAGNOSIS — M48061 Spinal stenosis, lumbar region without neurogenic claudication: Secondary | ICD-10-CM | POA: Diagnosis not present

## 2013-02-15 DIAGNOSIS — S7223XA Displaced subtrochanteric fracture of unspecified femur, initial encounter for closed fracture: Secondary | ICD-10-CM | POA: Diagnosis not present

## 2013-02-15 HISTORY — PX: INTRAMEDULLARY (IM) NAIL INTERTROCHANTERIC: SHX5875

## 2013-02-15 LAB — CBC
HCT: 37 % — ABNORMAL LOW (ref 39.0–52.0)
Hemoglobin: 11.6 g/dL — ABNORMAL LOW (ref 13.0–17.0)
Hemoglobin: 12.7 g/dL — ABNORMAL LOW (ref 13.0–17.0)
MCH: 31.2 pg (ref 26.0–34.0)
MCHC: 33.4 g/dL (ref 30.0–36.0)
MCHC: 34.3 g/dL (ref 30.0–36.0)
MCV: 90.9 fL (ref 78.0–100.0)
Platelets: 143 10*3/uL — ABNORMAL LOW (ref 150–400)
RBC: 3.82 MIL/uL — ABNORMAL LOW (ref 4.22–5.81)
RBC: 4.07 MIL/uL — ABNORMAL LOW (ref 4.22–5.81)
RDW: 14.4 % (ref 11.5–15.5)
WBC: 10.7 10*3/uL — ABNORMAL HIGH (ref 4.0–10.5)

## 2013-02-15 LAB — CREATININE, SERUM: GFR calc non Af Amer: 44 mL/min — ABNORMAL LOW (ref >90)

## 2013-02-15 LAB — BASIC METABOLIC PANEL
BUN: 15 mg/dL (ref 6–23)
CO2: 25 mEq/L (ref 19–32)
Chloride: 98 mEq/L (ref 96–112)
Creatinine, Ser: 1.33 mg/dL (ref 0.50–1.35)
GFR calc Af Amer: 57 mL/min — ABNORMAL LOW (ref >90)
GFR calc non Af Amer: 49 mL/min — ABNORMAL LOW (ref >90)

## 2013-02-15 SURGERY — FIXATION, FRACTURE, INTERTROCHANTERIC, WITH INTRAMEDULLARY ROD
Anesthesia: General | Site: Leg Upper | Laterality: Right | Wound class: Clean

## 2013-02-15 MED ORDER — CEFAZOLIN SODIUM-DEXTROSE 2-3 GM-% IV SOLR
2.0000 g | Freq: Four times a day (QID) | INTRAVENOUS | Status: AC
  Administered 2013-02-15 (×2): 2 g via INTRAVENOUS
  Filled 2013-02-15 (×2): qty 50

## 2013-02-15 MED ORDER — PHENYLEPHRINE HCL 10 MG/ML IJ SOLN
INTRAMUSCULAR | Status: DC | PRN
  Administered 2013-02-15 (×2): 160 ug via INTRAVENOUS
  Administered 2013-02-15: 80 ug via INTRAVENOUS

## 2013-02-15 MED ORDER — METHOCARBAMOL 500 MG PO TABS
500.0000 mg | ORAL_TABLET | Freq: Four times a day (QID) | ORAL | Status: DC | PRN
  Administered 2013-02-15: 500 mg via ORAL
  Filled 2013-02-15: qty 1

## 2013-02-15 MED ORDER — ONDANSETRON HCL 4 MG/2ML IJ SOLN
INTRAMUSCULAR | Status: DC | PRN
  Administered 2013-02-15: 4 mg via INTRAVENOUS

## 2013-02-15 MED ORDER — METOCLOPRAMIDE HCL 5 MG/ML IJ SOLN
5.0000 mg | Freq: Three times a day (TID) | INTRAMUSCULAR | Status: DC | PRN
  Filled 2013-02-15: qty 2

## 2013-02-15 MED ORDER — FENTANYL CITRATE 0.05 MG/ML IJ SOLN
INTRAMUSCULAR | Status: DC | PRN
  Administered 2013-02-15 (×2): 100 ug via INTRAVENOUS

## 2013-02-15 MED ORDER — HYDROMORPHONE HCL PF 1 MG/ML IJ SOLN
INTRAMUSCULAR | Status: AC
  Filled 2013-02-15: qty 1

## 2013-02-15 MED ORDER — MORPHINE SULFATE 2 MG/ML IJ SOLN: 0.5000 mg | INTRAMUSCULAR | Status: DC | PRN

## 2013-02-15 MED ORDER — MIDAZOLAM HCL 5 MG/5ML IJ SOLN
INTRAMUSCULAR | Status: DC | PRN
  Administered 2013-02-15 (×2): 1 mg via INTRAVENOUS

## 2013-02-15 MED ORDER — ONDANSETRON HCL 4 MG/2ML IJ SOLN
4.0000 mg | Freq: Four times a day (QID) | INTRAMUSCULAR | Status: DC | PRN
  Administered 2013-02-15 – 2013-02-19 (×4): 4 mg via INTRAVENOUS
  Filled 2013-02-15 (×4): qty 2

## 2013-02-15 MED ORDER — PROPOFOL 10 MG/ML IV BOLUS
INTRAVENOUS | Status: DC | PRN
  Administered 2013-02-15: 110 mg via INTRAVENOUS

## 2013-02-15 MED ORDER — MENTHOL 3 MG MT LOZG
1.0000 | LOZENGE | OROMUCOSAL | Status: DC | PRN
  Filled 2013-02-15: qty 9

## 2013-02-15 MED ORDER — OXYCODONE HCL 5 MG PO TABS
ORAL_TABLET | ORAL | Status: AC
  Administered 2013-02-15: 5 mg
  Filled 2013-02-15: qty 1

## 2013-02-15 MED ORDER — METHOCARBAMOL 500 MG PO TABS
ORAL_TABLET | ORAL | Status: AC
  Filled 2013-02-15: qty 1

## 2013-02-15 MED ORDER — OXYCODONE HCL 5 MG PO TABS
5.0000 mg | ORAL_TABLET | ORAL | Status: DC | PRN
  Administered 2013-02-15: 5 mg via ORAL
  Filled 2013-02-15: qty 1

## 2013-02-15 MED ORDER — NEOSTIGMINE METHYLSULFATE 1 MG/ML IJ SOLN
INTRAMUSCULAR | Status: DC | PRN
  Administered 2013-02-15: 3 mg via INTRAVENOUS

## 2013-02-15 MED ORDER — METOCLOPRAMIDE HCL 5 MG PO TABS
5.0000 mg | ORAL_TABLET | Freq: Three times a day (TID) | ORAL | Status: DC | PRN
  Filled 2013-02-15 (×2): qty 2

## 2013-02-15 MED ORDER — ENOXAPARIN SODIUM 40 MG/0.4ML ~~LOC~~ SOLN
40.0000 mg | SUBCUTANEOUS | Status: DC
  Administered 2013-02-16 – 2013-02-20 (×5): 40 mg via SUBCUTANEOUS
  Filled 2013-02-15 (×7): qty 0.4

## 2013-02-15 MED ORDER — ALUM & MAG HYDROXIDE-SIMETH 200-200-20 MG/5ML PO SUSP: 30.0000 mL | ORAL | Status: DC | PRN

## 2013-02-15 MED ORDER — VECURONIUM BROMIDE 10 MG IV SOLR
INTRAVENOUS | Status: DC | PRN
  Administered 2013-02-15: 2 mg via INTRAVENOUS

## 2013-02-15 MED ORDER — ONDANSETRON HCL 4 MG PO TABS
4.0000 mg | ORAL_TABLET | Freq: Four times a day (QID) | ORAL | Status: DC | PRN
  Filled 2013-02-15: qty 1

## 2013-02-15 MED ORDER — ROCURONIUM BROMIDE 100 MG/10ML IV SOLN
INTRAVENOUS | Status: DC | PRN
  Administered 2013-02-15: 50 mg via INTRAVENOUS

## 2013-02-15 MED ORDER — HYDROMORPHONE HCL PF 1 MG/ML IJ SOLN
0.2500 mg | INTRAMUSCULAR | Status: DC | PRN
  Administered 2013-02-15: 0.5 mg via INTRAVENOUS

## 2013-02-15 MED ORDER — METHOCARBAMOL 100 MG/ML IJ SOLN
500.0000 mg | Freq: Four times a day (QID) | INTRAVENOUS | Status: DC | PRN
  Filled 2013-02-15: qty 5

## 2013-02-15 MED ORDER — PHENOL 1.4 % MT LIQD
1.0000 | OROMUCOSAL | Status: DC | PRN
  Filled 2013-02-15: qty 177

## 2013-02-15 MED ORDER — LIDOCAINE HCL (CARDIAC) 20 MG/ML IV SOLN
INTRAVENOUS | Status: DC | PRN
  Administered 2013-02-15: 40 mg via INTRAVENOUS

## 2013-02-15 MED ORDER — GLYCOPYRROLATE 0.2 MG/ML IJ SOLN
INTRAMUSCULAR | Status: DC | PRN
  Administered 2013-02-15: 0.6 mg via INTRAVENOUS

## 2013-02-15 MED ORDER — LACTATED RINGERS IV SOLN
INTRAVENOUS | Status: DC | PRN
  Administered 2013-02-15 (×2): via INTRAVENOUS

## 2013-02-15 MED ORDER — CHLORHEXIDINE GLUCONATE 4 % EX LIQD
60.0000 mL | Freq: Once | CUTANEOUS | Status: AC
  Administered 2013-02-15: 4 via TOPICAL
  Filled 2013-02-15: qty 60

## 2013-02-15 MED ORDER — ACETAMINOPHEN 500 MG PO TABS
1000.0000 mg | ORAL_TABLET | Freq: Four times a day (QID) | ORAL | Status: DC
  Administered 2013-02-15: 21:00:00 1000 mg via ORAL
  Filled 2013-02-15 (×4): qty 2

## 2013-02-15 MED ORDER — 0.9 % SODIUM CHLORIDE (POUR BTL) OPTIME
TOPICAL | Status: DC | PRN
  Administered 2013-02-15: 1000 mL

## 2013-02-15 SURGICAL SUPPLY — 51 items
BIT DRILL 4.3MMS DISTAL GRDTED (BIT) IMPLANT
BNDG COHESIVE 6X5 TAN STRL LF (GAUZE/BANDAGES/DRESSINGS) IMPLANT
BRUSH SCRUB DISP (MISCELLANEOUS) ×4 IMPLANT
CLOTH BEACON ORANGE TIMEOUT ST (SAFETY) ×2 IMPLANT
CORTICAL BONE SCR 5.0MM X 46MM (Screw) ×2 IMPLANT
COVER SURGICAL LIGHT HANDLE (MISCELLANEOUS) ×4 IMPLANT
DRAPE C-ARMOR (DRAPES) ×2 IMPLANT
DRAPE ORTHO SPLIT 77X108 STRL (DRAPES)
DRAPE PROXIMA HALF (DRAPES) IMPLANT
DRAPE STERI IOBAN 125X83 (DRAPES) ×2 IMPLANT
DRAPE SURG ORHT 6 SPLT 77X108 (DRAPES) IMPLANT
DRAPE U-SHAPE 47X51 STRL (DRAPES) ×2 IMPLANT
DRILL 4.3MMS DISTAL GRADUATED (BIT) ×2
DRSG EMULSION OIL 3X3 NADH (GAUZE/BANDAGES/DRESSINGS) ×2 IMPLANT
DRSG MEPILEX BORDER 4X4 (GAUZE/BANDAGES/DRESSINGS) ×4 IMPLANT
DRSG MEPILEX BORDER 4X8 (GAUZE/BANDAGES/DRESSINGS) ×2 IMPLANT
ELECT REM PT RETURN 9FT ADLT (ELECTROSURGICAL) ×2
ELECTRODE REM PT RTRN 9FT ADLT (ELECTROSURGICAL) ×1 IMPLANT
GLOVE BIO SURGEON STRL SZ7.5 (GLOVE) ×2 IMPLANT
GLOVE BIO SURGEON STRL SZ8 (GLOVE) ×2 IMPLANT
GLOVE BIOGEL PI IND STRL 7.5 (GLOVE) ×1 IMPLANT
GLOVE BIOGEL PI IND STRL 8 (GLOVE) ×1 IMPLANT
GLOVE BIOGEL PI INDICATOR 7.5 (GLOVE) ×1
GLOVE BIOGEL PI INDICATOR 8 (GLOVE) ×1
GOWN PREVENTION PLUS XLARGE (GOWN DISPOSABLE) ×2 IMPLANT
GOWN STRL NON-REIN LRG LVL3 (GOWN DISPOSABLE) ×4 IMPLANT
GUIDEWIRE BALL NOSE 80CM (WIRE) ×1 IMPLANT
HIP FR NAIL LAG SCREW 10.5X110 (Orthopedic Implant) ×2 IMPLANT
KIT BASIN OR (CUSTOM PROCEDURE TRAY) ×2 IMPLANT
KIT ROOM TURNOVER OR (KITS) ×2 IMPLANT
MANIFOLD NEPTUNE II (INSTRUMENTS) ×2 IMPLANT
NAIL HIP FRACTURE 11X380MM (Nail) ×1 IMPLANT
NS IRRIG 1000ML POUR BTL (IV SOLUTION) ×2 IMPLANT
PACK GENERAL/GYN (CUSTOM PROCEDURE TRAY) ×2 IMPLANT
PAD ARMBOARD 7.5X6 YLW CONV (MISCELLANEOUS) ×4 IMPLANT
SCREW BONE CORTICAL 5.0X42 (Screw) ×1 IMPLANT
SCREW CORTICL BON 5.0MM X 46MM (Screw) IMPLANT
SCREW LAG HIP FR NAIL 10.5X110 (Orthopedic Implant) IMPLANT
SCREWDRIVER HEX TIP 3.5MM (MISCELLANEOUS) ×1 IMPLANT
STAPLER VISISTAT 35W (STAPLE) ×2 IMPLANT
STOCKINETTE IMPERVIOUS LG (DRAPES) IMPLANT
SUT ETHILON 3 0 PS 1 (SUTURE) ×2 IMPLANT
SUT VIC AB 0 CT1 27 (SUTURE) ×2
SUT VIC AB 0 CT1 27XBRD ANBCTR (SUTURE) ×1 IMPLANT
SUT VIC AB 1 CT1 27 (SUTURE) ×2
SUT VIC AB 1 CT1 27XBRD ANBCTR (SUTURE) ×1 IMPLANT
SUT VIC AB 2-0 CT1 27 (SUTURE) ×2
SUT VIC AB 2-0 CT1 TAPERPNT 27 (SUTURE) ×1 IMPLANT
TOWEL OR 17X24 6PK STRL BLUE (TOWEL DISPOSABLE) ×2 IMPLANT
TOWEL OR 17X26 10 PK STRL BLUE (TOWEL DISPOSABLE) ×4 IMPLANT
WATER STERILE IRR 1000ML POUR (IV SOLUTION) ×2 IMPLANT

## 2013-02-15 NOTE — Brief Op Note (Signed)
02/14/2013 - 02/15/2013  12:20 PM  PATIENT:  [name]  79 y.o. male  PRE-OPERATIVE DIAGNOSIS:  Right peritrochanteric hip fracture  POST-OPERATIVE DIAGNOSIS:  Right peritrochanteric hip fracture   PROCEDURE:  Procedure(s): INTRAMEDULLARY (IM) NAIL INTERTROCHANTRIC (Right) with Biomet Affixus 11 x statically locked nail  SURGEON:  Surgeon(s) and Role:    * Budd Palmer, MD - Primary  PHYSICIAN ASSISTANT: [name], [facility]  ANESTHESIA:   general  EBL:  Total I/O In: 1000 [I.V.:1000] Out: 1000 [Urine:1000]  BLOOD ADMINISTERED:none  DRAINS: none   LOCAL MEDICATIONS USED:  NONE  SPECIMEN:  No Specimen  DISPOSITION OF SPECIMEN:  N/A  COUNTS:  YES  TOURNIQUET:  * No tourniquets in log *  DICTATION: .Other Dictation: Dictation Number [id number]  PLAN OF CARE: Admit to inpatient   PATIENT DISPOSITION:  PACU - hemodynamically stable.   Delay start of Pharmacological VTE agent (>24hrs) due to surgical blood loss or risk of bleeding: no

## 2013-02-15 NOTE — Anesthesia Procedure Notes (Signed)
Procedure Name: Intubation Date/Time: 02/15/2013 10:39 AM Performed by: Whitman Hero Pre-anesthesia Checklist: Patient identified, Timeout performed, Emergency Drugs available, Suction available and Patient being monitored Patient Re-evaluated:Patient Re-evaluated prior to inductionOxygen Delivery Method: Circle system utilized Preoxygenation: Pre-oxygenation with 100% oxygen Intubation Type: IV induction Ventilation: Mask ventilation without difficulty Laryngoscope Size: Mac and 3 Grade View: Grade I Tube type: Oral Tube size: 7.5 mm Number of attempts: 1 Placement Confirmation: ETT inserted through vocal cords under direct vision,  breath sounds checked- equal and bilateral and positive ETCO2 Secured at: 21 cm Tube secured with: Tape Dental Injury: Teeth and Oropharynx as per pre-operative assessment

## 2013-02-15 NOTE — Anesthesia Postprocedure Evaluation (Signed)
Anesthesia Post Note  Patient: [name]  Procedure(s) Performed: Procedure(s) (LRB): INTRAMEDULLARY (IM) NAIL INTERTROCHANTRIC (Right)  Anesthesia type: General  Patient location: PACU  Post pain: Pain level controlled and Adequate analgesia  Post assessment: Post-op Vital signs reviewed, Patient's Cardiovascular Status Stable, Respiratory Function Stable, Patent Airway and Pain level controlled  Last Vitals:  Filed Vitals:   02/15/13 1245  BP: 160/84  Pulse: 95  Temp: 36.7 C  Resp: 21    Post vital signs: Reviewed and stable  Level of consciousness: awake, alert  and oriented  Complications: No apparent anesthesia complications

## 2013-02-15 NOTE — Progress Notes (Signed)
02/15/2013 patient had 100cc of green emesis, he was give nausea medication. Stephanine Reas WellingtonRN.

## 2013-02-15 NOTE — Transfer of Care (Signed)
Immediate Anesthesia Transfer of Care Note  Patient: [name]  Procedure(s) Performed: Procedure(s): INTRAMEDULLARY (IM) NAIL INTERTROCHANTRIC (Right)  Patient Location: PACU  Anesthesia Type:General  Level of Consciousness: awake, alert  and pateint uncooperative  Airway & Oxygen Therapy: Patient Spontanous Breathing  Post-op Assessment: Report given to PACU RN and Post -op Vital signs reviewed and stable  Post vital signs: Reviewed and stable  Complications: No apparent anesthesia complications

## 2013-02-15 NOTE — Progress Notes (Signed)
I have seen and examined the patient. I agree with the findings above.  R peritroch hip fracture Baseline neuropathy bilaterally with left sided weakness necessitating walker; back surgery originally scheduled for tomorrow with Dr. Dutch Quint for decompression. RLE Tender right hip, ER deformity  Sens absent DPN, intact but reduced SPN and TN  Motor EHL, ext, flex, evers intact  DP 1+, PT 1+  I discussed with the patient and his wife the risks and benefits of surgery for right hip fracture repair, including the possibility of infection, nerve injury, vessel injury, wound breakdown, arthritis, symptomatic hardware, DVT/ PE, loss of motion, and need for further surgery among others.  He understood these risks and wished to proceed.    Budd Palmer, MD 02/15/2013 9:59 AM

## 2013-02-15 NOTE — Consult Note (Signed)
Urology Consult Consulting physician: Darcella Cheshire, M.D.  CC: Difficult catheter placement  HPI: 79 year old male from [facility], admitted with a hip fracture last night. He is scheduled for orthopedic surgery this afternoon. He is in retention, and catheter placement was attempted x2 by the nursing staff. This was apparently difficult, even with the coud-tip catheter. I was called emergently to place a catheter. The patient does have a history of BPH and is managed by Dr. Saddie Benders in [place], [place]. He denies prior prostate surgery. He did have somewhat of a slow stream prior to admission, but has no prior episodes of retention.  PMH: Past Medical History  Diagnosis Date  . Hypertension   . Neuropathy   . Enlarged prostate   . Anxiety   . Depression   . Sleep apnea   . Neuromuscular disorder 02/14/13    damnage nerves in bil feet  . Cancer 02/14/13    skin cancer  . Arthritis     PSH: Past Surgical History  Procedure Laterality Date  . Joint replacement      Allergies: No Known Allergies  Medications: Prescriptions prior to admission  Medication Sig Dispense Refill  . acetaminophen (TYLENOL) 500 MG tablet Take 1,000 mg by mouth every 6 (six) hours as needed for pain.      Marland Kitchen allopurinol (ZYLOPRIM) 300 MG tablet Take 300 mg by mouth every morning.      Marland Kitchen amLODipine (NORVASC) 10 MG tablet Take 10 mg by mouth every morning.      . cyanocobalamin 500 MCG tablet Take 500 mcg by mouth daily.      . ferrous fumarate-iron polysaccharide complex (TANDEM) 162-115.2 MG CAPS Take 1 capsule by mouth daily with breakfast.      . gabapentin (NEURONTIN) 600 MG tablet Take 600 mg by mouth 2 (two) times daily.      Marland Kitchen ibuprofen (ADVIL,MOTRIN) 200 MG tablet Take 400 mg by mouth every 6 (six) hours as needed for pain.      Marland Kitchen irbesartan (AVAPRO) 300 MG tablet Take 300 mg by mouth every morning.      . Multiple Vitamin (MULTIVITAMIN WITH MINERALS) TABS tablet Take 1 tablet by mouth  daily.      Marland Kitchen oxyCODONE-acetaminophen (PERCOCET) 10-325 MG per tablet Take 1 tablet by mouth every 4 (four) hours as needed for pain.      . tamsulosin (FLOMAX) 0.4 MG CAPS capsule Take 0.4 mg by mouth daily after breakfast.      . zolpidem (AMBIEN) 10 MG tablet Take 10 mg by mouth at bedtime.         Social History: History   Social History  . Marital Status: Married    Spouse Name: N/A    Number of Children: N/A  . Years of Education: N/A   Occupational History  . Not on file.   Social History Main Topics  . Smoking status: Never Smoker   . Smokeless tobacco: Not on file  . Alcohol Use: No  . Drug Use: No  . Sexual Activity: Not on file   Other Topics Concern  . Not on file   Social History Narrative  . No narrative on file    Family History: History reviewed. No pertinent family history.  Review of Systems: Positive: Recent hip pain from fracture. Slow stream, intermittency, nocturia. Negative: .  A further 10 point review of systems was negative except what is listed in the HPI.  Physical Exam: @VITALS2 @ General: Mild distress.  Awake. Head:  Normocephalic.  Atraumatic. ENT:  EOMI.  Mucous membranes moist Neck:  Supple.  No lymphadenopathy. Pulmonary: Equal effort bilaterally.  Abdomen: Soft.  Non- tender to palpation. Skin:  Normal turgor.  No visible rash. Extremity: No gross deformity of bilateral upper extremities.  No gross deformity of    bilateral lower extremities. Neurologic: Alert. Appropriate mood.  Penis:  Uncircumcised.  No lesions. Urethra:           No Foley catheter in place.  Orthotopic meatus. Scrotum: No lesions.  No ecchymosis.  No erythema. Testicles: Descended bilaterally.  No masses bilaterally. Atrophic Epididymis: Palpable bilaterally.   Non Tender to palpation.  Studies:  Recent Labs     02/14/13  0730  02/15/13  0500  HGB  13.0  11.6*  WBC  11.1*  10.7*  PLT  164  146*    Recent Labs     02/14/13  0730  02/15/13   0500  NA  135  135  K  3.9  4.1  CL  97  98  CO2  23  25  BUN  17  15  CREATININE  1.41*  1.33  CALCIUM  9.2  8.6  GFRNONAA  46*  49*  GFRAA  53*  57*     No results found for this basename: PT, INR, APTT,  in the last 72 hours   No components found with this basename: ABG,   After patient's verbal consent, sterile prep and drape was performed on his penis. An 66 French coud-tip catheter was placed without difficulty. Clear, amber urine was obtained. This was hooked to dependent drainage.  Assessment:  BPH with retention secondary to multiple factors  Plan: Routine catheter management. The patient is more ambulatory postoperatively, the catheter can be removed. If voiding is difficult prior to discharge, leaving the catheter in and having him follow with his urologist in [place] would be fine.    Pager:[phone]

## 2013-02-15 NOTE — Progress Notes (Signed)
[facility] PROGRESS NOTE  Ashlee Bewley NFA:[id number] DOB: [birth year] DOA: 02/14/2013 PCP: No primary provider on file.   HPI/Subjective: Patient is a 79yo white male with a past med h/o HTN, Neuropathy, and enlarged prostate, presented to the [facility] ED yesterday after sustaining a fall at his home.  PMH is also significant for spinal stenosis causing lower extremity weakness.  In the ED, pt. was diagnosed with a displaced R intertrochanteric femoral Fx.  He was then transferred to [facility] for surgical repair.  Spinal surgery was scheduled with Dr. Jordan Likes on Friday but is now postponed.    Assessment/Plan:  Right femur fracture w/ surgical repair: -Per Ortho. -Dr. Carola Frost placed intramedullary nail to repair on 10/2 -SNF at discharge.   Spinal Stenosis: -Sees Neurosurgery outpatient - Dr. Jordan Likes -Surgery was scheduled for 10/3-- postponed now -Erie Noe (Dr. Lindalou Hose Nurse) was notified of current hospitalization and informed to cancel surgery until cleared by Ortho. [facility] Neurosurgery [phone])   HTN -Well controlled on home meds -Will continue home regimen while inpatient -Norvasc 10mg , Irbesartan 300mg -- once daily in morning.   Urinary retention / BPH -Flomax .4mg   -Had complaints of urinary retention -Foley inserted by Urology after 3 unsuccessful attempts by nursing staff; good flow was reported after insertion. -may need to be discharged with foley in place. -Minimal blood noticed coming from urethra; not worrisome  -F/u with PCP post-op for better medical control of obstructive symptoms  Code Status: Full Family Communication: Wife at bedside. No questions or concerns at this time. Disposition Plan:  inpatient/SNF for rehab post op.   Consultants:  Ortho  Procedures:  Intermedullary Intertrochanteric Nail fixation of Right Intertrochanteric Hip Fx  Antibiotics:  3g Ancef one dose 10/1-- prophylactic for hip sx.    Objective: Filed Vitals:   02/15/13 0921  BP: 155/79  Pulse: 79  Temp: 98.9 F (37.2 C)  Resp: 18    Intake/Output Summary (Last 24 hours) at 02/15/13 1254 Last data filed at 02/15/13 1245  Gross per 24 hour  Intake   1840 ml  Output   2700 ml  Net   -860 ml   Filed Weights   02/14/13 0033  Weight: 104.5 kg (230 lb 6.1 oz)    Exam:   General:  awake, alert, in discomfort secondary to hip fracture.    Cardiovascular: RRR, no murmur, gallop, rubs, Normal S1/S2, Cap refill 2+ in extremities  Respiratory: CTAB, no r/r/w. Symmetrical chest wall movement, no accessory muscle use.  Abdomen: Soft, non tender, no palpable masses, no distention, no organomegaly, decreased bowel sounds.  Musculoskeletal: RLE ROM not assessed due to hip fracture.   Able to move other 3 extremities w/o difficulty.  Neruo: Sensation intact in all extremities, no focal deficits.  Data Reviewed: Basic Metabolic Panel:  Recent Labs Lab 02/14/13 0730 02/15/13 0500  NA 135 135  K 3.9 4.1  CL 97 98  CO2 23 25  GLUCOSE 113* 103*  BUN 17 15  CREATININE 1.41* 1.33  CALCIUM 9.2 8.6   CBC  Recent Labs Lab 02/14/13 0730 02/15/13 0500  WBC 11.1* 10.7*  HGB 13.0 11.6*  HCT 37.3* 34.7*  MCV 89.4 90.8  PLT 164 146*     Studies: Dg Knee Right Port  02/14/2013   CLINICAL DATA:  Knee pain. Hip fracture  EXAM: PORTABLE RIGHT KNEE - 1-2 VIEW  COMPARISON:  None.  FINDINGS: Moderate to advanced tricompartmental degenerative changes with joint space narrowing and spurring. Small joint effusion  Negative for fracture.  IMPRESSION: Moderate to advanced degenerative change. No acute abnormality.   Electronically Signed   By: Marlan Palau M.D.   On: 02/14/2013 16:49    Scheduled Meds: . [facility] HOLD] amLODipine  10 mg Oral q morning - 10a  . [facility] HOLD] cyanocobalamin  500 mcg Oral Daily  . [facility] HOLD] gabapentin  600 mg Oral BID  . [[facility] HOLD] irbesartan  300 mg Oral q morning - 10a  . [facility] HOLD] iron polysaccharides  150 mg Oral  Daily  . [MAR HOLD] multivitamin with minerals  1 tablet Oral Daily  . [facility] HOLD] tamsulosin  0.4 mg Oral QPC breakfast  . [[facility] HOLD] zolpidem  5 mg Oral QHS   Continuous Infusions: . sodium chloride 75 mL/hr at 02/15/13 0102    Principal Problem:   Displaced intertrochanteric fracture of right femur Active Problems:   Spinal stenosis of lumbar region   Hypertension   Romualdo Bolk [phone] [facility] 02/15/2013, 12:54 PM  LOS: 1 day     Addendum  Patient seen and examined, chart and data base reviewed.  I agree with the above assessment and plan.  For full details please see Mrs. Algis Downs PA note.   Clint Lipps, MD [facility] Pager: [phone] 02/15/2013, 4:38 PM

## 2013-02-15 NOTE — Anesthesia Preprocedure Evaluation (Addendum)
Anesthesia Evaluation  Patient identified by MRN, date of birth, ID band Patient awake    Reviewed: Allergy & Precautions, H&P , NPO status , Patient's Chart, lab work & pertinent test results  Airway Mallampati: II TM Distance: >3 FB Neck ROM: Full    Dental no notable dental hx. (+) Teeth Intact and Dental Advisory Given   Pulmonary  breath sounds clear to auscultation  Pulmonary exam normal       Cardiovascular hypertension, On Medications negative cardio ROS  Rhythm:Regular Rate:Normal     Neuro/Psych PSYCHIATRIC DISORDERS    GI/Hepatic negative GI ROS, Neg liver ROS,   Endo/Other  negative endocrine ROS  Renal/GU negative Renal ROS  negative genitourinary   Musculoskeletal   Abdominal   Peds  Hematology negative hematology ROS (+)   Anesthesia Other Findings   Reproductive/Obstetrics negative OB ROS                          Anesthesia Physical Anesthesia Plan  ASA: II  Anesthesia Plan: General   Post-op Pain Management:    Induction: Intravenous  Airway Management Planned: Oral ETT  Additional Equipment:   Intra-op Plan:   Post-operative Plan: Extubation in OR  Informed Consent: I have reviewed the patients History and Physical, chart, labs and discussed the procedure including the risks, benefits and alternatives for the proposed anesthesia with the patient or authorized representative who has indicated his/her understanding and acceptance.   Dental advisory given  Plan Discussed with: CRNA and Surgeon  Anesthesia Plan Comments:        Anesthesia Quick Evaluation

## 2013-02-16 ENCOUNTER — Inpatient Hospital Stay (HOSPITAL_COMMUNITY): Payer: Medicare Other

## 2013-02-16 ENCOUNTER — Inpatient Hospital Stay (HOSPITAL_COMMUNITY): Admission: RE | Admit: 2013-02-16 | Payer: Medicare Other | Source: Ambulatory Visit | Admitting: Neurosurgery

## 2013-02-16 ENCOUNTER — Encounter (HOSPITAL_COMMUNITY): Payer: Self-pay | Admitting: Orthopedic Surgery

## 2013-02-16 ENCOUNTER — Encounter (HOSPITAL_COMMUNITY): Admission: RE | Payer: Self-pay | Source: Ambulatory Visit

## 2013-02-16 DIAGNOSIS — I10 Essential (primary) hypertension: Secondary | ICD-10-CM | POA: Diagnosis not present

## 2013-02-16 DIAGNOSIS — K567 Ileus, unspecified: Secondary | ICD-10-CM

## 2013-02-16 DIAGNOSIS — M48061 Spinal stenosis, lumbar region without neurogenic claudication: Secondary | ICD-10-CM | POA: Diagnosis not present

## 2013-02-16 DIAGNOSIS — R296 Repeated falls: Secondary | ICD-10-CM | POA: Diagnosis not present

## 2013-02-16 DIAGNOSIS — S72143A Displaced intertrochanteric fracture of unspecified femur, initial encounter for closed fracture: Secondary | ICD-10-CM

## 2013-02-16 DIAGNOSIS — K560 Paralytic ileus: Secondary | ICD-10-CM | POA: Diagnosis not present

## 2013-02-16 HISTORY — DX: Ileus, unspecified: K56.7

## 2013-02-16 LAB — BASIC METABOLIC PANEL
BUN: 23 mg/dL (ref 6–23)
CO2: 25 mEq/L (ref 19–32)
Calcium: 8.5 mg/dL (ref 8.4–10.5)
Creatinine, Ser: 1.54 mg/dL — ABNORMAL HIGH (ref 0.50–1.35)
GFR calc non Af Amer: 41 mL/min — ABNORMAL LOW (ref >90)
Glucose, Bld: 140 mg/dL — ABNORMAL HIGH (ref 70–99)
Potassium: 4.7 mEq/L (ref 3.5–5.1)
Sodium: 135 mEq/L (ref 135–145)

## 2013-02-16 LAB — CBC
HCT: 32.7 % — ABNORMAL LOW (ref 39.0–52.0)
Hemoglobin: 11.2 g/dL — ABNORMAL LOW (ref 13.0–17.0)
MCH: 30.9 pg (ref 26.0–34.0)
RBC: 3.62 MIL/uL — ABNORMAL LOW (ref 4.22–5.81)
RDW: 14.3 % (ref 11.5–15.5)

## 2013-02-16 SURGERY — LUMBAR LAMINECTOMY/DECOMPRESSION MICRODISCECTOMY 2 LEVELS
Anesthesia: General | Site: Back

## 2013-02-16 MED ORDER — METHOCARBAMOL 100 MG/ML IJ SOLN
500.0000 mg | Freq: Three times a day (TID) | INTRAVENOUS | Status: DC | PRN
  Filled 2013-02-16: qty 5

## 2013-02-16 MED ORDER — SODIUM CHLORIDE 0.9 % IV SOLN
INTRAVENOUS | Status: DC
  Administered 2013-02-16 – 2013-02-18 (×4): via INTRAVENOUS

## 2013-02-16 MED ORDER — ENOXAPARIN SODIUM 40 MG/0.4ML ~~LOC~~ SOLN: 40.0000 mg | SUBCUTANEOUS | Status: DC

## 2013-02-16 MED ORDER — ACETAMINOPHEN 500 MG PO TABS: 1000.0000 mg | ORAL_TABLET | Freq: Three times a day (TID) | ORAL | Status: DC

## 2013-02-16 MED ORDER — PROMETHAZINE HCL 25 MG/ML IJ SOLN
25.0000 mg | Freq: Once | INTRAMUSCULAR | Status: AC
  Administered 2013-02-16: 25 mg via INTRAVENOUS
  Filled 2013-02-16: qty 1

## 2013-02-16 MED ORDER — ACETAMINOPHEN 325 MG PO TABS: 325.0000 mg | ORAL_TABLET | Freq: Four times a day (QID) | ORAL | Status: DC | PRN

## 2013-02-16 MED ORDER — FLEET ENEMA 7-19 GM/118ML RE ENEM
1.0000 | ENEMA | Freq: Once | RECTAL | Status: DC
  Filled 2013-02-16: qty 1

## 2013-02-16 MED ORDER — METOCLOPRAMIDE HCL 5 MG/ML IJ SOLN
5.0000 mg | Freq: Three times a day (TID) | INTRAMUSCULAR | Status: AC
  Administered 2013-02-16 – 2013-02-17 (×6): 5 mg via INTRAVENOUS
  Filled 2013-02-16 (×7): qty 1

## 2013-02-16 MED ORDER — HYDROCODONE-ACETAMINOPHEN 7.5-325 MG PO TABS: 1.0000 | ORAL_TABLET | Freq: Four times a day (QID) | ORAL | Status: DC | PRN

## 2013-02-16 MED ORDER — KETOROLAC TROMETHAMINE 30 MG/ML IJ SOLN
30.0000 mg | Freq: Four times a day (QID) | INTRAMUSCULAR | Status: DC | PRN
  Filled 2013-02-16 (×2): qty 1

## 2013-02-16 MED ORDER — HYDRALAZINE HCL 20 MG/ML IJ SOLN: 10.0000 mg | Freq: Four times a day (QID) | INTRAMUSCULAR | Status: DC | PRN

## 2013-02-16 NOTE — Clinical Social Work Psychosocial (Signed)
Clinical Social Work Department BRIEF PSYCHOSOCIAL ASSESSMENT 02/16/2013  Patient:  [name], [name]     Account Number:  [id number]     Admit date:  02/14/2013  Clinical Social Worker:  Delmer Islam  Date/Time:  02/16/2013 12:38 PM  Referred by:  Physician  Date Referred:  02/14/2013 Referred for  SNF Placement   Other Referral:   Interview type:  Family Other interview type:   CSW talked with patient's wife, Mrs. Daxon Kyne at the bedside. Phone number [phone]    PSYCHOSOCIAL DATA Living Status:  WIFE Admitted from facility:   Level of care:   Primary support name:  [name] Primary support relationship to patient:  SPOUSE Degree of support available:   Wife very concerned and supportive    CURRENT CONCERNS Current Concerns  Post-Acute Placement   Other Concerns:    SOCIAL WORK ASSESSMENT / PLAN CSW talked with Mrs. Ferrall about discharge planning at the bedside. Patient was sleeping at the time. CSW explained need for short-term rehab prior to returning home and wife is in agreement. Wife reported that she and patient have been to outpatient therapy in [place]. CSW explained SNF search process and when asked about county to send information out to, patient [facility]. Mrs. Sherrow was given SNF list for [facility]   Assessment/plan status:  Psychosocial Support/Ongoing Assessment of Needs Other assessment/ plan:   Information/referral to community resources:   SNF list for [facility]    PATIENT'S/FAMILY'S RESPONSE TO PLAN OF CARE: Wife very pleasant and open to talking with CSW. Patient and wife live in [place] but she reported that they are very near [facility].

## 2013-02-16 NOTE — Op Note (Signed)
[name], [name] NO.:  [id number]  MEDICAL RECORD NO.:  [id number]  LOCATION:  [id number]                        FACILITY:  [facility]  PHYSICIAN:  Doralee Albino. Carola Frost, M.D. DATE OF BIRTH:  31-Dec-1931  DATE OF PROCEDURE:  02/15/2013 DATE OF DISCHARGE:                              OPERATIVE REPORT   PREOPERATIVE DIAGNOSIS:  Right peritrochanteric hip fracture.  POSTOPERATIVE DIAGNOSIS:  Right peritrochanteric hip fracture.  PROCEDURE:  Intramedullary nailing of the right hip using a Biomet Affixus 11 x 380 mm statically locked nail.  SURGEON:  Doralee Albino. Carola Frost, M.D.  ASSISTANT:  [name], [place].  ANESTHESIA:  General.  COMPLICATIONS:  None.  ESTIMATED BLOOD LOSS:  200 mL.  URINARY OUTPUT:  1000 mL.  IV FLUIDS:  1000 mL of crystalloid.  DISPOSITION:  To PACU.  CONDITION:  Stable.  BRIEF SUMMARY AND INDICATION FOR PROCEDURE:  [name] is a 79 year old male who has a past medical history notable for bilateral neuropathy with left-sided weakness and is walker dependent.  He was trying to stamp a spider when he fell sustaining a significant right hip fracture. I discussed with the patient the risks and benefits of surgery including possibility of infection, nerve injury, vessel injury, DVT, PE, heart attack, stroke, malunion, nonunion, need for further surgery, and multiple others and the patient and his wife did wish to proceed.  BRIEF SUMMARY OF PROCEDURE:  [name] was given Ancef preoperatively, taken to the operating room where general anesthesia was induced.  She was then positioned supine on the fracture table.  A reduction maneuver was performed of his right hip and confirmed with C-arm.  Standard prep and drape was then performed and C-arm brought in to identify the correct starting point and trajectory.  A 2.5-cm incision was made and layer was extended to facilitate removal of the jig.  Curved cannulated awl was placed on the correct  starting point just medial to the tip of the greater troch on the down slope and the threaded starting guide wire advanced in the center of the proximal femur.  This was followed by use of the starting reamer and then advancement of a ball-tipped guidewire into the center-center position of the knee on both AP and lateral images.  This was followed by sequential reaming up to 13 mm and then placement of an 11 x 380 mm nail.  The lag screw in the femoral head was placed into the center-center position and then traction released once the lag screw was placed fully into the femoral head.  The compression device engaged and then 2 distal locks placed using perfect circle technique.  All wounds were irrigated thoroughly.  All implants checked for final trajectory, position, and length and standard layered closure was performed.  Shawn Morita, PA-C did assist me throughout the procedure and was able to regain closure during placement of the locking bolts to expedite the case and the operative time and also assist during reduction with placement into the femoral head.  PROGNOSIS:  [name] will be weightbearing as tolerated on the right lower extremity with use of his walker.  He is at increased risk of complications because  of his underlying neuropathy and motor weakness. He will be on DVT prophylaxis with Lovenox.  His surgery for the back has been postponed.     Doralee Albino. Carola Frost, M.D.     MHH/MEDQ  D:  02/15/2013  T:  02/16/2013  Job:  [id number]

## 2013-02-16 NOTE — Clinical Social Work Placement (Addendum)
Clinical Social Work Department CLINICAL SOCIAL WORK PLACEMENT NOTE 02/16/2013  Patient:  [name], [name]  Account Number:  [id number] Admit date:  02/14/2013  Clinical Social Worker:  Genelle Bal, LCSW  Date/time:  02/16/2013 12:44 PM  Clinical Social Work is seeking post-discharge placement for this patient at the following level of care:   SKILLED NURSING   (*CSW will update this form in Epic as items are completed)   02/16/2013  Patient/family provided with [facility] Department of Clinical Social Work's list of facilities offering this level of care within the geographic area requested by the patient (or if unable, by the patient's family).  02/16/2013  Patient/family informed of their freedom to choose among providers that offer the needed level of care, that participate in Medicare, Medicaid or managed care program needed by the patient, have an available bed and are willing to accept the patient.    Patient/family informed of [facility]' ownership interest in [facility], as well as of the fact that they are under no obligation to receive care at this facility.  PASARR submitted to EDS on 02/16/13 PASARR number received from EDS on 02/16/13  FL2 transmitted to all facilities in geographic area requested by pt/family on  02/16/2013 FL2 transmitted to all facilities within larger geographic area on   Patient informed that his/her managed care company has contracts with or will negotiate with  certain facilities, including the following:     Patient/family informed of bed offers received: 02/20/13  Patient chooses bed at [facility] Physician recommends and patient chooses bed at    Patient to be transferred to [facility] on 02/20/13  Patient to be transferred to facility by ambulance  The following physician request were entered in Epic:   Additional Comments: 02/20/13 - Patient and wife requested [facility] be contacted. Clinicals sent to facility  10/7 and bed offered.

## 2013-02-16 NOTE — Progress Notes (Signed)
Rehab admissions - Evaluated for possible admission.  I spoke with patient and his wife at the bedside.   They would like rehab closer to home.  Wife mentioned [facility] first and then [facility] if necessary.  Social worker is already aware per family.  I will sign off for acute inpatient rehab.  Call me for questions.  #[phone]

## 2013-02-16 NOTE — Progress Notes (Signed)
Orthopaedic Trauma Service (OTS)  Subjective: 1 Day Post-Op Procedure(s) (LRB): INTRAMEDULLARY (IM) NAIL INTERTROCHANTRIC (Right) Patient reports pain as moderate.   Persistent N/V; no gas today but some yesterday.  Objective: Current Vitals Blood pressure 146/67, pulse 78, temperature 98.1 F (36.7 C), temperature source Oral, resp. rate 18, height 5\' 10"  (1.778 m), weight 104.5 kg (230 lb 6.1 oz), SpO2 98.00%. Vital signs in last 24 hours: Temp:  [98 F (36.7 C)-98.9 F (37.2 C)] 98.1 F (36.7 C) (10/02 2111) Pulse Rate:  [78-110] 78 (10/03 0558) Resp:  [18-22] 18 (10/03 0558) BP: (112-160)/(62-84) 146/67 mmHg (10/03 0558) SpO2:  [92 %-98 %] 98 % (10/03 0558)  Intake/Output from previous day: 10/02 0701 - 10/03 0700 In: 1650 [I.V.:1600; IV Piggyback:50] Out: 1200 [Urine:1200]  LABS  Recent Labs  02/14/13 0730 02/15/13 0500 02/15/13 1555 02/16/13 0451  HGB 13.0 11.6* 12.7* 11.2*    Recent Labs  02/15/13 1555 02/16/13 0451  WBC 16.5* 12.8*  RBC 4.07* 3.62*  HCT 37.0* 32.7*  PLT 143* 138*    Recent Labs  02/15/13 0500 02/15/13 1555 02/16/13 0451  NA 135  --  135  K 4.1  --  4.7  CL 98  --  101  CO2 25  --  25  BUN 15  --  23  CREATININE 1.33 1.46* 1.54*  GLUCOSE 103*  --  140*  CALCIUM 8.6  --  8.5   No results found for this basename: LABPT, INR,  in the last 72 hours  Physical Exam  Pale, with basin on chest Abd distended RLE  drsg clean and dry  Sens DPN, SPN, TN intact  Motor EHL, ext, flex, evers 5/5  DP 1+  No significant edema   Imaging Dg Hip Operative Right  02/15/2013   CLINICAL DATA:  Fracture  EXAM: DG OPERATIVE RIGHT HIP  TECHNIQUE: A single spot fluoroscopic AP image of the right hip is submitted.  COMPARISON:  02/13/2013  FINDINGS: Intra medullary rod and dynamic compression screw transfix an intertrochanteric right femur fracture. 2 distal interlocking screws. Anatomic alignment.  IMPRESSION: ORIF femur fracture.    Electronically Signed   By: Maryclare Bean M.D.   On: 02/15/2013 15:14   Dg Pelvis Portable  02/15/2013   CLINICAL DATA:  80 year old male status post surgery. Initial encounter.  EXAM: PORTABLE PELVIS  COMPARISON:  02/13/2013.  FINDINGS: Portable views of the right hip and femur. Right femoral intra medullary rod with distal interlocking cortical screws and proximal interlocking dynamic hip screw have been placed traversing the intertrochanteric fracture. Improved alignment of fracture fragments. Hardware appears intact. Right femoral head normally located. No new fracture identified.  IMPRESSION: ORIF right femur with no adverse features.   Electronically Signed   By: Augusto Gamble M.D.   On: 02/15/2013 15:35   Dg Hip Portable 1 View Right  02/15/2013   CLINICAL DATA:  Postop.  EXAM: PORTABLE RIGHT HIP - 1 VIEW  COMPARISON:  02/15/2013 and 02/14/2013 as well as 02/13/2013  FINDINGS: A single portable cross-table lateral view of the right hip demonstrates an intramedullary rod with associated screw bridging the femoral neck into the femoral head as hardware appears to be intact and adequately positioned. Residual lucency is seen over patients trochanteric fracture.  IMPRESSION: Post internal fixation of patient's known right trochanteric fracture with hardware intact and normally positioned. Recommend correlation with findings at the time of the procedure.   Electronically Signed   By: Elberta Fortis M.D.   On:  02/15/2013 15:36   Dg Knee Right Port  02/14/2013   CLINICAL DATA:  Knee pain. Hip fracture  EXAM: PORTABLE RIGHT KNEE - 1-2 VIEW  COMPARISON:  None.  FINDINGS: Moderate to advanced tricompartmental degenerative changes with joint space narrowing and spurring. Small joint effusion  Negative for fracture.  IMPRESSION: Moderate to advanced degenerative change. No acute abnormality.   Electronically Signed   By: Marlan Palau M.D.   On: 02/14/2013 16:49    Assessment/Plan: 1 Day Post-Op Procedure(s)  (LRB): INTRAMEDULLARY (IM) NAIL INTERTROCHANTRIC (Right)  N/V--cont Zofran, add Reglan Given acute urinary retention with 1100cc and requiring urologist for foley insertion, would leave until Monday for voiding trial so that urology might be reconsulted if needed. WBAT Lovenox  Myrene Galas, MD [facility] [phone] (p)  02/16/2013, 8:39 AM

## 2013-02-16 NOTE — Progress Notes (Addendum)
[facility] PROGRESS NOTE  Adil Tugwell RUE:[id number] DOB: [birth year] DOA: 02/14/2013 PCP: No primary provider on file.   HPI/Subjective: Patient is a 79yo white male with a past med h/o HTN, Neuropathy, and enlarged prostate, presented to the [facility] ED yesterday after sustaining a fall at his home.  PMH is also significant for spinal stenosis causing lower extremity weakness.  In the ED, pt. was diagnosed with a displaced R intertrochanteric femoral Fx.  He was then transferred to [facility] for surgical repair.  Spinal surgery was scheduled with Dr. Jordan Likes on Friday but is now postponed.    Assessment/Plan:  Right femur fracture w/ surgical repair: -Per Ortho. -Dr. Carola Frost placed intramedullary nail to repair on 10/2 -SNF at discharge.  Post Op Ileus -patient with severe nausea/vomiting on 10/3 -N/G placed -Serial xrays -Potassium between 4.5 - 5.0 -Attempt to mobilize -minimize/elminiate narcotics and anticolinergics  Spinal Stenosis: -Sees Neurosurgery outpatient - Dr. Jordan Likes -Surgery was scheduled for 10/3-- postponed now -Erie Noe (Dr. Lindalou Hose Nurse) was notified of current hospitalization and informed to cancel surgery until cleared by Ortho. [facility] Neurosurgery [phone])  HTN -Well controlled on home meds -Will continue home regimen while inpatient -Norvasc 10mg , Irbesartan 300mg -- once daily in morning.  Urinary retention / BPH -Flomax .4mg   -Had complaints of urinary retention -Foley inserted by Urology after 3 unsuccessful attempts by nursing staff; good flow was reported after insertion. -may need to be discharged with foley in place. -Minimal blood noticed coming from urethra; not worrisome  -F/u with PCP post-op for better medical control of obstructive symptoms  Creatinine trending up Post op Continue IVF Monitor Creatinine.  Code Status: Full Family Communication: Wife at bedside. No questions or concerns at this time. Disposition Plan:   inpatient/SNF for rehab post op.   Consultants:  Ortho  Procedures:  Intermedullary Intertrochanteric Nail fixation of Right Intertrochanteric Hip Fx  N/G placed 10/3  Antibiotics:  3g Ancef one dose 10/1-- prophylactic for hip sx.    Objective: Filed Vitals:   02/16/13 1100  BP:   Pulse: 140  Temp:   Resp:     Intake/Output Summary (Last 24 hours) at 02/16/13 1301 Last data filed at 02/16/13 0602  Gross per 24 hour  Intake     50 ml  Output    200 ml  Net   -150 ml   Filed Weights   02/14/13 0033  Weight: 104.5 kg (230 lb 6.1 oz)    Exam:   General:  awake, alert, in discomfort with nausea and vomiting  Cardiovascular: RRR, no murmur, gallop, rubs, Normal S1/S2, Cap refill 2+ in extremities  Respiratory: CTAB, no w/c/r  Abdomen: Distended, firm but not tight, non tender, no palpable masses, positive BS after n/g was placed.  Musculoskeletal: RLE ROM not assessed due to hip fracture.   Able to move other 3 extremities w/o difficulty.  Neruo: Sensation intact in all extremities, no focal deficits.  Data Reviewed: Basic Metabolic Panel:  Recent Labs Lab 02/14/13 0730 02/15/13 0500 02/15/13 1555 02/16/13 0451  NA 135 135  --  135  K 3.9 4.1  --  4.7  CL 97 98  --  101  CO2 23 25  --  25  GLUCOSE 113* 103*  --  140*  BUN 17 15  --  23  CREATININE 1.41* 1.33 1.46* 1.54*  CALCIUM 9.2 8.6  --  8.5   CBC  Recent Labs Lab 02/14/13 0730 02/15/13 0500 02/15/13 1555 02/16/13 0451  WBC 11.1* 10.7* 16.5*  12.8*  HGB 13.0 11.6* 12.7* 11.2*  HCT 37.3* 34.7* 37.0* 32.7*  MCV 89.4 90.8 90.9 90.3  PLT 164 146* 143* 138*     Studies: Dg Hip Operative Right  13-Mar-2013   CLINICAL DATA:  Fracture  EXAM: DG OPERATIVE RIGHT HIP  TECHNIQUE: A single spot fluoroscopic AP image of the right hip is submitted.  COMPARISON:  02/13/2013  FINDINGS: Intra medullary rod and dynamic compression screw transfix an intertrochanteric right femur fracture. 2 distal  interlocking screws. Anatomic alignment.  IMPRESSION: ORIF femur fracture.   Electronically Signed   By: Maryclare Bean M.D.   On: 03-13-2013 15:14   Dg Pelvis Portable  2013-03-13   CLINICAL DATA:  79 year old male status post surgery. Initial encounter.  EXAM: PORTABLE PELVIS  COMPARISON:  02/13/2013.  FINDINGS: Portable views of the right hip and femur. Right femoral intra medullary rod with distal interlocking cortical screws and proximal interlocking dynamic hip screw have been placed traversing the intertrochanteric fracture. Improved alignment of fracture fragments. Hardware appears intact. Right femoral head normally located. No new fracture identified.  IMPRESSION: ORIF right femur with no adverse features.   Electronically Signed   By: Augusto Gamble M.D.   On: 03/13/13 15:35   Dg Hip Portable 1 View Right  03-13-2013   CLINICAL DATA:  Postop.  EXAM: PORTABLE RIGHT HIP - 1 VIEW  COMPARISON:  03-13-2013 and 02/14/2013 as well as 02/13/2013  FINDINGS: A single portable cross-table lateral view of the right hip demonstrates an intramedullary rod with associated screw bridging the femoral neck into the femoral head as hardware appears to be intact and adequately positioned. Residual lucency is seen over patients trochanteric fracture.  IMPRESSION: Post internal fixation of patient's known right trochanteric fracture with hardware intact and normally positioned. Recommend correlation with findings at the time of the procedure.   Electronically Signed   By: Elberta Fortis M.D.   On: March 13, 2013 15:36   Dg Knee Right Port  02/14/2013   CLINICAL DATA:  Knee pain. Hip fracture  EXAM: PORTABLE RIGHT KNEE - 1-2 VIEW  COMPARISON:  None.  FINDINGS: Moderate to advanced tricompartmental degenerative changes with joint space narrowing and spurring. Small joint effusion  Negative for fracture.  IMPRESSION: Moderate to advanced degenerative change. No acute abnormality.   Electronically Signed   By: Marlan Palau M.D.    On: 02/14/2013 16:49   Dg Abd 2 Views  02/16/2013   CLINICAL DATA:  Vomiting. Postop hip surgery  EXAM: ABDOMEN - 2 VIEW  COMPARISON:  None.  FINDINGS: Dilated small bowel loops in the mid abdomen. Dilated stomach. Colon is filled with stool and nondilated. No free air.  IMPRESSION: Dilated small bowel and stomach suggestive of small bowel obstruction. Followup studies are suggested and differentiate from ileus.   Electronically Signed   By: Marlan Palau M.D.   On: 02/16/2013 09:57    Scheduled Meds: . enoxaparin (LOVENOX) injection  40 mg Subcutaneous Q24H  . metoCLOPramide (REGLAN) injection  5 mg Intravenous Q8H   Continuous Infusions: . sodium chloride 75 mL/hr at 02/16/13 1610    Principal Problem:   Displaced intertrochanteric fracture of right femur Active Problems:   Spinal stenosis of lumbar region   Hypertension   Ileus   [name] [phone] [facility] 02/16/2013, 1:01 PM  LOS: 2 days

## 2013-02-16 NOTE — Progress Notes (Signed)
Addendum  Patient seen and examined, chart and data base reviewed.  I agree with the above assessment and plan.  For full details please see Mrs. Algis Downs PA note.  Nausea and vomiting, xray showing ileus Vs SBO. NGT and enema, follow clinically, if not better GI or Gen surg.   Clint Lipps, MD [facility] Pager: [phone] 02/16/2013, 1:19 PM

## 2013-02-16 NOTE — Consult Note (Signed)
Physical Medicine and Rehabilitation Consult Reason for Consult: Right peritrochanteric hip fracture Referring Physician: [facility]   HPI: [name] is a 79 y.o. right-handed male admitted 02/14/2013 after a fall when he raised his right leg to step on a spider. Patient had been suffering from left leg weakness for weeks secondary to lumbar spinal stenosis with upcoming plan for surgical intervention per Dr. Jordan Likes of neurosurgery. X-rays and imaging revealed right peri trochanteric hip fracture. Underwent intramedullary nail 02/15/2013 per Dr. Carola Frost. Placed on subcutaneous Lovenox for DVT prophylaxis. Touchdown to partial weightbearing right lower extremity. Postoperative pain management. Physical and occupational therapy evaluation pending. M.D. is requested physical medicine rehabilitation consult to consider inpatient rehabilitation services.  Review of Systems  Genitourinary: Positive for urgency.  Musculoskeletal: Positive for myalgias and back pain.  Psychiatric/Behavioral: Positive for depression.       Anxiety  All other systems reviewed and are negative.   Past Medical History  Diagnosis Date  . Hypertension   . Neuropathy   . Enlarged prostate   . Anxiety   . Depression   . Sleep apnea   . Neuromuscular disorder 02/14/13    damnage nerves in bil feet  . Cancer 02/14/13    skin cancer  . Arthritis    Past Surgical History  Procedure Laterality Date  . Joint replacement     History reviewed. No pertinent family history. Social History:  reports that he has never smoked. He does not have any smokeless tobacco history on file. He reports that he does not drink alcohol or use illicit drugs. Allergies: No Known Allergies Medications Prior to Admission  Medication Sig Dispense Refill  . acetaminophen (TYLENOL) 500 MG tablet Take 1,000 mg by mouth every 6 (six) hours as needed for pain.      Marland Kitchen allopurinol (ZYLOPRIM) 300 MG tablet Take 300 mg by mouth every morning.      Marland Kitchen  amLODipine (NORVASC) 10 MG tablet Take 10 mg by mouth every morning.      . cyanocobalamin 500 MCG tablet Take 500 mcg by mouth daily.      . ferrous fumarate-iron polysaccharide complex (TANDEM) 162-115.2 MG CAPS Take 1 capsule by mouth daily with breakfast.      . gabapentin (NEURONTIN) 600 MG tablet Take 600 mg by mouth 2 (two) times daily.      Marland Kitchen ibuprofen (ADVIL,MOTRIN) 200 MG tablet Take 400 mg by mouth every 6 (six) hours as needed for pain.      Marland Kitchen irbesartan (AVAPRO) 300 MG tablet Take 300 mg by mouth every morning.      . Multiple Vitamin (MULTIVITAMIN WITH MINERALS) TABS tablet Take 1 tablet by mouth daily.      Marland Kitchen oxyCODONE-acetaminophen (PERCOCET) 10-325 MG per tablet Take 1 tablet by mouth every 4 (four) hours as needed for pain.      . tamsulosin (FLOMAX) 0.4 MG CAPS capsule Take 0.4 mg by mouth daily after breakfast.      . zolpidem (AMBIEN) 10 MG tablet Take 10 mg by mouth at bedtime.        Home: Home Living Family/patient expects to be discharged to:: Private residence Living Arrangements: Spouse/significant other  Functional History:   Functional Status:  Mobility:          ADL:    Cognition: Cognition Orientation Level: Oriented to person;Oriented to place;Disoriented to time    Blood pressure 146/67, pulse 78, temperature 98.1 F (36.7 C), temperature source Oral, resp. rate 18, height 5\' 10"  (1.778  m), weight 104.5 kg (230 lb 6.1 oz), SpO2 98.00%. Physical Exam  Vitals reviewed. Constitutional: He appears well-developed and well-nourished. No distress.  HENT:  Head: Normocephalic and atraumatic.  Left Ear: External ear normal.  Eyes: Conjunctivae and EOM are normal. Pupils are equal, round, and reactive to light.  Neck: Normal range of motion. Neck supple. No thyromegaly present.  Cardiovascular: Normal rate and regular rhythm.   Pulmonary/Chest: Effort normal and breath sounds normal. No respiratory distress.  Abdominal: Soft. Bowel sounds are  normal. He exhibits no distension. There is no tenderness.  Musculoskeletal:  Right hip wound clean and dressed. Hip is Expectedly tender.   Neurological:    He makes good eye contact with examiner. He was able to provide his name, age and date of birth. He does have a fair awareness of his deficits. Patient follows simple commands. Left hip with referred pain from back.   Skin:  The incision is dressed and appropriately tender  Psychiatric: He has a normal mood and affect. His behavior is normal.    Results for orders placed during the hospital encounter of 02/14/13 (from the past 24 hour(s))  CBC     Status: Abnormal   Collection Time    02/15/13  3:55 PM      Result Value Range   WBC 16.5 (*) 4.0 - 10.5 K/uL   RBC 4.07 (*) 4.22 - 5.81 MIL/uL   Hemoglobin 12.7 (*) 13.0 - 17.0 g/dL   HCT 47.8 (*) 29.5 - 62.1 %   MCV 90.9  78.0 - 100.0 fL   MCH 31.2  26.0 - 34.0 pg   MCHC 34.3  30.0 - 36.0 g/dL   RDW 30.8  65.7 - 84.6 %   Platelets 143 (*) 150 - 400 K/uL  CREATININE, SERUM     Status: Abnormal   Collection Time    02/15/13  3:55 PM      Result Value Range   Creatinine, Ser 1.46 (*) 0.50 - 1.35 mg/dL   GFR calc non Af Amer 44 (*) >90 mL/min   GFR calc Af Amer 51 (*) >90 mL/min  CBC     Status: Abnormal   Collection Time    02/16/13  4:51 AM      Result Value Range   WBC 12.8 (*) 4.0 - 10.5 K/uL   RBC 3.62 (*) 4.22 - 5.81 MIL/uL   Hemoglobin 11.2 (*) 13.0 - 17.0 g/dL   HCT 96.2 (*) 95.2 - 84.1 %   MCV 90.3  78.0 - 100.0 fL   MCH 30.9  26.0 - 34.0 pg   MCHC 34.3  30.0 - 36.0 g/dL   RDW 32.4  40.1 - 02.7 %   Platelets 138 (*) 150 - 400 K/uL  BASIC METABOLIC PANEL     Status: Abnormal   Collection Time    02/16/13  4:51 AM      Result Value Range   Sodium 135  135 - 145 mEq/L   Potassium 4.7  3.5 - 5.1 mEq/L   Chloride 101  96 - 112 mEq/L   CO2 25  19 - 32 mEq/L   Glucose, Bld 140 (*) 70 - 99 mg/dL   BUN 23  6 - 23 mg/dL   Creatinine, Ser 2.53 (*) 0.50 - 1.35 mg/dL    Calcium 8.5  8.4 - 66.4 mg/dL   GFR calc non Af Amer 41 (*) >90 mL/min   GFR calc Af Amer 48 (*) >90 mL/min   Dg  Hip Operative Right  02/15/2013   CLINICAL DATA:  Fracture  EXAM: DG OPERATIVE RIGHT HIP  TECHNIQUE: A single spot fluoroscopic AP image of the right hip is submitted.  COMPARISON:  02/13/2013  FINDINGS: Intra medullary rod and dynamic compression screw transfix an intertrochanteric right femur fracture. 2 distal interlocking screws. Anatomic alignment.  IMPRESSION: ORIF femur fracture.   Electronically Signed   By: Maryclare Bean M.D.   On: 02/15/2013 15:14   Dg Pelvis Portable  02/15/2013   CLINICAL DATA:  79 year old male status post surgery. Initial encounter.  EXAM: PORTABLE PELVIS  COMPARISON:  02/13/2013.  FINDINGS: Portable views of the right hip and femur. Right femoral intra medullary rod with distal interlocking cortical screws and proximal interlocking dynamic hip screw have been placed traversing the intertrochanteric fracture. Improved alignment of fracture fragments. Hardware appears intact. Right femoral head normally located. No new fracture identified.  IMPRESSION: ORIF right femur with no adverse features.   Electronically Signed   By: Augusto Gamble M.D.   On: 02/15/2013 15:35   Dg Hip Portable 1 View Right  02/15/2013   CLINICAL DATA:  Postop.  EXAM: PORTABLE RIGHT HIP - 1 VIEW  COMPARISON:  02/15/2013 and 02/14/2013 as well as 02/13/2013  FINDINGS: A single portable cross-table lateral view of the right hip demonstrates an intramedullary rod with associated screw bridging the femoral neck into the femoral head as hardware appears to be intact and adequately positioned. Residual lucency is seen over patients trochanteric fracture.  IMPRESSION: Post internal fixation of patient's known right trochanteric fracture with hardware intact and normally positioned. Recommend correlation with findings at the time of the procedure.   Electronically Signed   By: Elberta Fortis M.D.   On:  02/15/2013 15:36   Dg Knee Right Port  02/14/2013   CLINICAL DATA:  Knee pain. Hip fracture  EXAM: PORTABLE RIGHT KNEE - 1-2 VIEW  COMPARISON:  None.  FINDINGS: Moderate to advanced tricompartmental degenerative changes with joint space narrowing and spurring. Small joint effusion  Negative for fracture.  IMPRESSION: Moderate to advanced degenerative change. No acute abnormality.   Electronically Signed   By: Marlan Palau M.D.   On: 02/14/2013 16:49    Assessment/Plan: Diagnosis: right peritrochanteric hip fx, hx of lumbar stenosis and left sided radiculopathy 1. Does the need for close, 24 hr/day medical supervision in concert with the patient's rehab needs make it unreasonable for this patient to be served in a less intensive setting? Yes 2. Co-Morbidities requiring supervision/potential complications: htn, pain, gait/balance deficits 3. Due to bladder management, bowel management, safety, skin/wound care, disease management, medication administration, pain management and patient education, does the patient require 24 hr/day rehab nursing? Yes 4. Does the patient require coordinated care of a physician, rehab nurse, PT (1-2 hrs/day, 5 days/week) and OT (1-2 hrs/day, 5 days/week) to address physical and functional deficits in the context of the above medical diagnosis(es)? Yes Addressing deficits in the following areas: balance, endurance, locomotion, strength, transferring, bowel/bladder control, bathing, dressing, feeding, grooming and toileting 5. Can the patient actively participate in an intensive therapy program of at least 3 hrs of therapy per day at least 5 days per week? Yes 6. The potential for patient to make measurable gains while on inpatient rehab is good 7. Anticipated functional outcomes upon discharge from inpatient rehab are supervision to min assist with PT, supervision to min assist with OT, n/a with SLP. 8. Estimated rehab length of stay to reach the above functional goals is:  ?  1-2 weeks 9. Does the patient have adequate social supports to accommodate these discharge functional goals? Yes 10. Anticipated D/C setting: Home 11. Anticipated post D/C treatments: HH therapy 12. Overall Rehab/Functional Prognosis: excellent  RECOMMENDATIONS: This patient's condition is appropriate for continued rehabilitative care in the following setting: CIR Patient has agreed to participate in recommended program. Potentially Note that insurance prior authorization may be required for reimbursement for recommended care.  Comment: This patient would likely benefit from our program, especially given the issues with his lumbar spine. He does seem, however, to want to go to a "facility" closer to home. We will follow along as therapy mobilizes him.   Ranelle Oyster, MD, [facility] Physical Medicine & Rehabilitation     02/16/2013

## 2013-02-16 NOTE — Evaluation (Signed)
Occupational Therapy Evaluation Patient Details Name: [name] MRN: [id number] DOB: March 04, 1932 Today's Date: 02/16/2013 Time: 7829-5621 OT Time Calculation (min): 35 min  OT Assessment / Plan / Recommendation History of present illness Pt is a 79 yo male admitted on 02/14/13 after a fall resulting in R hip fracture.  Pt underwent an ORIF with IM nail and is now WBAT.  Pt was scheduled to have back surgery with Dr. Jordan Likes today for stenosis which has not been postponed.  The pain from stenosis affected L hip and back therefore pt very weak in standing now.   Clinical Impression   PT admitted with above diagnosis with the deficits listed below.  Pt would benefit from cont OT to increase I with all adls so pt can eventually d/c home with wife and prepare for his back surgery.      OT Assessment  Patient needs continued OT Services    Follow Up Recommendations  SNF;Supervision/Assistance - 24 hour    Barriers to Discharge Decreased caregiver support only has wife to assist at home.  Pt +2 assist right now.  Equipment Recommendations  None recommended by OT    Recommendations for Other Services    Frequency  Min 2X/week    Precautions / Restrictions Precautions Precautions: Fall Precaution Comments: Wife reports pt has taken several falls in the last month and this is why he was going to have back surgery. Restrictions Weight Bearing Restrictions: Yes RLE Weight Bearing: Weight bearing as tolerated   Pertinent Vitals/Pain Pt with HR at rest around 120.  With activity, pt's HR up to 147 therefore mobility was stopped and pt was returned to supine.  O2 not replaced after NG tube placed.  Sats checked and at 80%.  2L replaced and pt quickly went to 92%.  Nursing notified.    ADL  Eating/Feeding: NPO Grooming: Simulated;Set up Where Assessed - Grooming: Supported sitting Upper Body Bathing: Simulated;Set up Where Assessed - Upper Body Bathing: Supported sitting Lower Body Bathing:  Simulated;Maximal assistance Where Assessed - Lower Body Bathing: Supported sit to stand Upper Body Dressing: Simulated;Moderate assistance Where Assessed - Upper Body Dressing: Supported sitting Lower Body Dressing: Simulated;+2 Total assistance Lower Body Dressing: Patient Percentage: 10% Where Assessed - Lower Body Dressing: Supported sit to stand Equipment Used: Standard walker Transfers/Ambulation Related to ADLs: Pt came to side of bed with max assist of one.  Pt unable to reach full standing due to LE weakness/pain/high HR ADL Comments: Pt very lethargic from nausea meds. Pt just had NG tube placed but nuring hoping to get pt to chair.  Pt required a great amt of assist with LE adls due to pain and weakness.    OT Diagnosis: Generalized weakness;Acute pain  OT Problem List: Decreased strength;Decreased range of motion;Decreased activity tolerance;Impaired balance (sitting and/or standing);Decreased knowledge of use of DME or AE;Pain OT Treatment Interventions: Self-care/ADL training;DME and/or AE instruction;Therapeutic activities;Balance training   OT Goals(Current goals can be found in the care plan section) Acute Rehab OT Goals Patient Stated Goal: to get well. OT Goal Formulation: With patient/family Time For Goal Achievement: 03/02/13 Potential to Achieve Goals: Good ADL Goals Pt Will Perform Lower Body Bathing: with mod assist;sit to/from stand Pt Will Perform Lower Body Dressing: with mod assist;sit to/from stand;with adaptive equipment Pt Will Transfer to Toilet: with mod assist;bedside commode;stand pivot transfer Pt Will Perform Toileting - Clothing Manipulation and hygiene: with mod assist;sit to/from stand  Visit Information  Last OT Received On: 02/16/13 Assistance Needed: +2  History of Present Illness: Pt is a 79 yo male admitted on 02/14/13 after a fall resulting in R hip fracture.  Pt underwent an ORIF with IM nail and is now WBAT.  Pt was scheduled to have back  surgery with Dr. Jordan Likes today for stenosis which has not been postponed.  The pain from stenosis affected L hip and back therefore pt very weak in standing now.       Prior Functioning     Home Living Family/patient expects to be discharged to:: Private residence Living Arrangements: Spouse/significant other Available Help at Discharge: Family Type of Home: House Home Layout: One level Home Equipment: Environmental consultant - 2 wheels;Bedside commode;Shower seat Prior Function Level of Independence: Independent with assistive device(s) Comments: Pt was walking with walker for last 2 weeks due to so many recent falls waiting for spinal surgery that was scheduled for today. Communication Communication: No difficulties Dominant Hand: Right         Vision/Perception Vision - History Baseline Vision: No visual deficits Vision - Assessment Vision Assessment: Vision not tested   Cognition  Cognition Arousal/Alertness: Lethargic;Suspect due to medications Behavior During Therapy: WFL for tasks assessed/performed Overall Cognitive Status: Within Functional Limits for tasks assessed    Extremity/Trunk Assessment Upper Extremity Assessment Upper Extremity Assessment: Overall WFL for tasks assessed Lower Extremity Assessment Lower Extremity Assessment: Defer to PT evaluation     Mobility Transfers Transfers: Sit to Stand;Stand to Sit Sit to Stand: 1: +2 Total assist;From elevated surface;From bed Sit to Stand: Patient Percentage: 10% Stand to Sit: 1: +2 Total assist;To bed;With armrests;To elevated surface Stand to Sit: Patient Percentage: 10% Details for Transfer Assistance: Pt's HR elevated during attempts to stand; therefore returned pt to supine in bed.     Exercise     Balance Balance Balance Assessed: Yes Static Sitting Balance Static Sitting - Balance Support: Bilateral upper extremity supported;Feet supported Static Sitting - Level of Assistance: 4: Min assist Static Sitting -  Comment/# of Minutes: 5   End of Session OT - End of Session Equipment Utilized During Treatment: Gait belt;Rolling walker Activity Tolerance: Patient limited by lethargy;Patient limited by pain Patient left: in bed;with call bell/phone within reach;with family/visitor present Nurse Communication: Mobility status;Other (comment) (pt off O2 on arrival.  O2replaced as O2 sat 80%.)  GO     Hope Budds 02/16/2013, 11:51 AM [phone]

## 2013-02-16 NOTE — Progress Notes (Signed)
PT Cancellation Note  Patient Details Name: Taym Twist MRN: [id number] DOB: June 25, 1931   Cancelled Treatment:    Reason Eval/Treat Not Completed: Fatigue/lethargy limiting ability to participate. Pt with NG tube placement today, and OT evaluation already performed. Per OT, pt heavy +2 assist at this time for functional mobility as pt has spinal stenosis (surgery has been postponed) which is affecting his L LE, and he is now s/p IM nailing of R hip. HR elevated during standing activity limiting OT eval, and pt has been vomiting throughout the day. Will attempt evaluation again tomorrow.   Ruthann Cancer 02/16/2013, 2:57 PM  Ruthann Cancer, PT, DPT Acute Rehabilitation Services

## 2013-02-17 ENCOUNTER — Inpatient Hospital Stay (HOSPITAL_COMMUNITY): Payer: Medicare Other

## 2013-02-17 DIAGNOSIS — K560 Paralytic ileus: Secondary | ICD-10-CM | POA: Diagnosis not present

## 2013-02-17 DIAGNOSIS — I10 Essential (primary) hypertension: Secondary | ICD-10-CM | POA: Diagnosis not present

## 2013-02-17 DIAGNOSIS — K3189 Other diseases of stomach and duodenum: Secondary | ICD-10-CM | POA: Diagnosis not present

## 2013-02-17 DIAGNOSIS — M48061 Spinal stenosis, lumbar region without neurogenic claudication: Secondary | ICD-10-CM | POA: Diagnosis not present

## 2013-02-17 LAB — BASIC METABOLIC PANEL
CO2: 24 mEq/L (ref 19–32)
GFR calc Af Amer: 57 mL/min — ABNORMAL LOW (ref >90)
GFR calc non Af Amer: 49 mL/min — ABNORMAL LOW (ref >90)
Glucose, Bld: 98 mg/dL (ref 70–99)
Potassium: 4.1 mEq/L (ref 3.5–5.1)
Sodium: 140 mEq/L (ref 135–145)

## 2013-02-17 LAB — CBC
Hemoglobin: 10 g/dL — ABNORMAL LOW (ref 13.0–17.0)
MCHC: 34.1 g/dL (ref 30.0–36.0)
RBC: 3.23 MIL/uL — ABNORMAL LOW (ref 4.22–5.81)
WBC: 8.4 10*3/uL (ref 4.0–10.5)

## 2013-02-17 NOTE — Progress Notes (Signed)
[facility] PROGRESS NOTE  [name] ZOX:[id number] DOB: [birth year] DOA: 02/14/2013 PCP: No primary provider on file.   HPI/Subjective: Patient is a 79yo white male with a past med h/o HTN, Neuropathy, and enlarged prostate, presented to the [facility] ED yesterday after sustaining a fall at his home.  PMH is also significant for spinal stenosis causing lower extremity weakness.  In the ED, pt. was diagnosed with a displaced R intertrochanteric femoral Fx.  He was then transferred to [facility] for surgical repair.  Spinal surgery was scheduled with Dr. Jordan Likes on Friday but is now postponed.   Today feels frustrated at the slow improvement, wants NG out   Assessment/Plan:  Right femur fracture w/ surgical repair: -Per Ortho. -Dr. Carola Frost placed intramedullary nail to repair on 10/2 -SNF at discharge.  Post Op Ileus -patient with severe nausea/vomiting on 10/3 -N/G with wll suction - Abdominal xray shows improvement - Will check xray in am  Spinal Stenosis: -Sees Neurosurgery outpatient - Dr. Jordan Likes -Surgery was scheduled for 10/3-- postponed now -Erie Noe (Dr. Lindalou Hose Nurse) was notified of current hospitalization and informed to cancel surgery until cleared by Ortho. [facility] Neurosurgery [phone])  HTN - BP is stable - Continue Hydralazine pn   Urinary retention / BPH -Flomax .4mg   -Had complaints of urinary retention -Foley inserted by Urology after 3 unsuccessful attempts by nursing staff; good flow was reported after insertion. -may need to be discharged with foley in place. -Minimal blood noticed coming from urethra; not worrisome  -F/u with PCP post-op for better medical control of obstructive symptoms .  Code Status: Full Family Communication: Wife at bedside. Discussed the need for NG tube Disposition Plan:  inpatient/SNF for rehab post op.   Consultants:  Ortho  Procedures:  Intermedullary Intertrochanteric Nail fixation of Right Intertrochanteric Hip  Fx  N/G placed 10/3  Antibiotics:  3g Ancef one dose 10/1-- prophylactic for hip sx.    Objective: Filed Vitals:   02/17/13 1124  BP:   Pulse:   Temp:   Resp: 18    Intake/Output Summary (Last 24 hours) at 02/17/13 1437 Last data filed at 02/17/13 0517  Gross per 24 hour  Intake  542.5 ml  Output   1750 ml  Net -1207.5 ml   Filed Weights   02/14/13 0033  Weight: 104.5 kg (230 lb 6.1 oz)    Exam:  Physical Exam: Head: Normocephalic, atraumatic.  Eyes: No signs of jaundice, EOMI Nose: Mucous membranes dry.  Throat: Oropharynx nonerythematous, no exudate appreciated.  Neck: supple,No deformities, masses, or tenderness noted. Lungs: Normal respiratory effort. B/L Clear to auscultation, no crackles or wheezes.  Heart: Regular RR. S1 and S2 normal  Abdomen: BS absent, distended, nontender to palpation  Extremities: No pretibial edema, no erythema   Data Reviewed: Basic Metabolic Panel:  Recent Labs Lab 02/14/13 0730 02/15/13 0500 02/15/13 1555 02/16/13 0451 02/17/13 0640  NA 135 135  --  135 140  K 3.9 4.1  --  4.7 4.1  CL 97 98  --  101 104  CO2 23 25  --  25 24  GLUCOSE 113* 103*  --  140* 98  BUN 17 15  --  23 25*  CREATININE 1.41* 1.33 1.46* 1.54* 1.33  CALCIUM 9.2 8.6  --  8.5 8.1*   CBC  Recent Labs Lab 02/14/13 0730 02/15/13 0500 02/15/13 1555 02/16/13 0451 02/17/13 0640  WBC 11.1* 10.7* 16.5* 12.8* 8.4  HGB 13.0 11.6* 12.7* 11.2* 10.0*  HCT 37.3* 34.7* 37.0* 32.7*  29.3*  MCV 89.4 90.8 90.9 90.3 90.7  PLT 164 146* 143* 138* 150     Studies: Dg Abd 2 Views  02/17/2013   CLINICAL DATA:  Evaluate ileus. Stomach pain.  EXAM: ABDOMEN - 2 VIEW  COMPARISON:  02/16/2013  FINDINGS: There is a nasogastric tube with tip in the proximal stomach. There has been significant improvement in dilated small bowel loops. Single loop of dilated small bowel persists within the right abdomen with associated air-fluid level. There is some gas and stool  noted within the colon up to the rectum.  IMPRESSION: 1. Iinterval improvement in small bowel dilatation.   Electronically Signed   By: Signa Kell M.D.   On: 02/17/2013 07:49   Dg Abd 2 Views  02/16/2013   CLINICAL DATA:  Vomiting. Postop hip surgery  EXAM: ABDOMEN - 2 VIEW  COMPARISON:  None.  FINDINGS: Dilated small bowel loops in the mid abdomen. Dilated stomach. Colon is filled with stool and nondilated. No free air.  IMPRESSION: Dilated small bowel and stomach suggestive of small bowel obstruction. Followup studies are suggested and differentiate from ileus.   Electronically Signed   By: Marlan Palau M.D.   On: 02/16/2013 09:57    Scheduled Meds: . enoxaparin (LOVENOX) injection  40 mg Subcutaneous Q24H  . metoCLOPramide (REGLAN) injection  5 mg Intravenous Q8H   Continuous Infusions: . sodium chloride 75 mL/hr at 02/17/13 1239    Principal Problem:   Displaced intertrochanteric fracture of right femur Active Problems:   Spinal stenosis of lumbar region   Hypertension   Ileus  [name] [phone] [facility] 02/17/2013, 2:37 PM  LOS: 3 days

## 2013-02-17 NOTE — Evaluation (Signed)
Physical Therapy Evaluation Patient Details Name: [name] MRN: [id number] DOB: [birth year] Today's Date: 02/17/2013 Time: 4098-1191 PT Time Calculation (min): 29 min  PT Assessment / Plan / Recommendation History of Present Illness  Pt is a 79 yo male admitted on 02/14/13 after a fall resulting in R hip fracture.  Pt underwent an ORIF with IM nail and is now WBAT.  Pt was scheduled to have back surgery with Dr. Jordan Likes today for stenosis which has been postponed.  The pain from stenosis affected L hip and back therefore pt very weak on left side. Remote h/o left TKA.  Clinical Impression  Pt very limited with mobility due to LLE weakness from back issues and now RLE pain and weakness from femur fx. Pt able to achieve standing today with +2 tot A and maintain 3 mins before left knee buckled. Recommend SNF for rehab. PT will follow acutely.    PT Assessment  Patient needs continued PT services    Follow Up Recommendations  SNF;Supervision/Assistance - 24 hour    Does the patient have the potential to tolerate intense rehabilitation      Barriers to Discharge Decreased caregiver support wife unable to physically assist at level he will require    Equipment Recommendations  None recommended by PT    Recommendations for Other Services     Frequency Min 3X/week    Precautions / Restrictions Precautions Precautions: Fall Precaution Comments: pt reports several falls due to RLE weakness from back issues. Restrictions Weight Bearing Restrictions: Yes RLE Weight Bearing: Weight bearing as tolerated   Pertinent Vitals/Pain 10/ 10 RLE pain with mobility, RN made aware      Mobility  Bed Mobility Bed Mobility: Supine to Sit;Sitting - Scoot to [place] of Bed;Sit to Supine;Scooting to [facility] Supine to Sit: 3: Mod assist;HOB elevated Sitting - Scoot to Edge of Bed: 2: Max assist Sit to Supine: 1: +2 Total assist Sit to Supine: Patient Percentage: 20% Scooting to HOB: 1: +2 Total  assist Scooting to [facility]: Patient Percentage: 70% Details for Bed Mobility Assistance: would have preferred to transfer to left away from sore hip but pt with NG tube and tube length limiting. Therefore, HOB elevated and pt assisted in pivoting to right. Very painful but pt able to assist in bringing trunk forward and hip motion facilitated by pad on bed. Pt able to asisst minimally with back into bed due to pain but is able to asisst pulling self up in bed with rails and pushing with LLE Transfers Transfers: Sit to Stand;Stand to Sit Sit to Stand: 1: +2 Total assist;From elevated surface;From bed Sit to Stand: Patient Percentage: 30% Stand to Sit: 1: +2 Total assist;To bed;To elevated surface Stand to Sit: Patient Percentage: 30% Details for Transfer Assistance: pt with improved standing today from yesterday. Was able to achieve full upright posture with 2 tries and maintained standing x3 mins. Able to pick feet up and down several times. Increased WB'ing through UE's for support. Left knee began to buckle after 3 mins. Right foot turned outward but pt able to correct. Do not feel that it would be wise for pt to transfer to chair yet as lift would be necessary for back to bed and would be extremely painful for pt at this point Ambulation/Gait Ambulation/Gait Assistance: Not tested (comment) Assistive device: Rolling walker Stairs: No Wheelchair Mobility Wheelchair Mobility: No    Exercises General Exercises - Lower Extremity Ankle Circles/Pumps: AROM;Both;10 reps;Supine Quad Sets: AROM;Right;10 reps;Supine Heel Slides: AROM;Right;10 reps;Supine  Straight Leg Raises: AAROM;Right;10 reps;Supine   PT Diagnosis: Difficulty walking;Acute pain;Generalized weakness  PT Problem List: Decreased strength;Decreased range of motion;Decreased activity tolerance;Decreased balance;Decreased mobility;Decreased knowledge of use of DME;Decreased knowledge of precautions;Pain PT Treatment Interventions: DME  instruction;Gait training;Functional mobility training;Therapeutic activities;Therapeutic exercise;Balance training;Neuromuscular re-education;Patient/family education     PT Goals(Current goals can be found in the care plan section) Acute Rehab PT Goals Patient Stated Goal: to get well. PT Goal Formulation: With patient Time For Goal Achievement: 03/03/13 Potential to Achieve Goals: Good  Visit Information  Last PT Received On: 02/17/13 Assistance Needed: +2 History of Present Illness: Pt is a 79 yo male admitted on 02/14/13 after a fall resulting in R hip fracture.  Pt underwent an ORIF with IM nail and is now WBAT.  Pt was scheduled to have back surgery with Dr. Jordan Likes today for stenosis which has been postponed.  The pain from stenosis affected L hip and back therefore pt very weak on left side. Remote h/o left TKA.       Prior Functioning  Home Living Family/patient expects to be discharged to:: Private residence Living Arrangements: Spouse/significant other Available Help at Discharge: Skilled Nursing Facility Type of Home: House Home Layout: One level Home Equipment: Environmental consultant - 2 wheels;Bedside commode;Shower seat Additional Comments: pt planning to go to SNF for rehab until back surgery is possible Prior Function Level of Independence: Independent with assistive device(s) Comments: Pt was walking with walker for last 2 weeks due to so many recent falls waiting for spinal surgery that was scheduled for today. Communication Communication: No difficulties Dominant Hand: Right    Cognition  Cognition Arousal/Alertness: Awake/alert Behavior During Therapy: WFL for tasks assessed/performed Overall Cognitive Status: Within Functional Limits for tasks assessed    Extremity/Trunk Assessment Upper Extremity Assessment Upper Extremity Assessment: Defer to OT evaluation Lower Extremity Assessment Lower Extremity Assessment: RLE deficits/detail;LLE deficits/detail RLE Deficits /  Details: hip flex 2-/5, knee ext 3-/5, ankle >3/5 RLE: Unable to fully assess due to pain LLE Deficits / Details: able to move LLE against gravity in bed but in standing, left knee buckles unexpectedly which has led to most of his falls. Cervical / Trunk Assessment Cervical / Trunk Assessment: Normal   Balance Balance Balance Assessed: Yes Static Sitting Balance Static Sitting - Balance Support: Bilateral upper extremity supported;Feet supported Static Sitting - Level of Assistance: 5: Stand by assistance Static Sitting - Comment/# of Minutes: 5 Static Standing Balance Static Standing - Balance Support: Bilateral upper extremity supported;During functional activity Static Standing - Level of Assistance: 4: Min assist Static Standing - Comment/# of Minutes: 3  End of Session PT - End of Session Equipment Utilized During Treatment: Gait belt;Oxygen Activity Tolerance: Patient limited by pain Patient left: in bed;with call bell/phone within reach Nurse Communication: Mobility status  GP   Lyanne Co, PT  Acute Rehab Services  [phone]   Lyanne Co 02/17/2013, 12:16 PM

## 2013-02-17 NOTE — Progress Notes (Signed)
Subjective: 2 Days Post-Op Procedure(s) (LRB): INTRAMEDULLARY (IM) NAIL INTERTROCHANTRIC (Right) Patient reports pain as mild and moderate.    Objective: Vital signs in last 24 hours: Temp:  [98.8 F (37.1 C)-100.3 F (37.9 C)] 100.3 F (37.9 C) (10/03 2326) Pulse Rate:  [95-140] 102 (10/03 2326) Resp:  [16-20] 18 (10/04 0800) BP: (131-162)/(66-88) 131/66 mmHg (10/03 2326) SpO2:  [82 %-100 %] 100 % (10/04 0800)  Intake/Output from previous day: 10/03 0701 - 10/04 0700 In: 542.5 [I.V.:542.5] Out: 1750 [Urine:1325; Emesis/NG output:425] Intake/Output this shift:     Recent Labs  02/15/13 0500 02/15/13 1555 02/16/13 0451 02/17/13 0640  HGB 11.6* 12.7* 11.2* 10.0*    Recent Labs  02/16/13 0451 02/17/13 0640  WBC 12.8* 8.4  RBC 3.62* 3.23*  HCT 32.7* 29.3*  PLT 138* 150    Recent Labs  02/16/13 0451 02/17/13 0640  NA 135 140  K 4.7 4.1  CL 101 104  CO2 25 24  BUN 23 25*  CREATININE 1.54* 1.33  GLUCOSE 140* 98  CALCIUM 8.5 8.1*   No results found for this basename: LABPT, INR,  in the last 72 hours  Neurovascular intact Sensation intact distally but diminished at baseline 2ndary to neuropathy Intact pulses distally Dorsiflexion/Plantar flexion intact Incision: dressing clean dry intact No cellulitis present Compartment soft  Assessment/Plan: 2 Days Post-Op Procedure(s) (LRB): INTRAMEDULLARY (IM) NAIL INTERTROCHANTRIC (Right) Up with therapy DVT proph with lovenox Pain control as ordered Will continue to follow  [name] 02/17/2013, 10:23 AM

## 2013-02-18 ENCOUNTER — Inpatient Hospital Stay (HOSPITAL_COMMUNITY): Payer: Medicare Other

## 2013-02-18 DIAGNOSIS — S72143A Displaced intertrochanteric fracture of unspecified femur, initial encounter for closed fracture: Secondary | ICD-10-CM | POA: Diagnosis not present

## 2013-02-18 DIAGNOSIS — K560 Paralytic ileus: Secondary | ICD-10-CM

## 2013-02-18 DIAGNOSIS — R141 Gas pain: Secondary | ICD-10-CM | POA: Diagnosis not present

## 2013-02-18 DIAGNOSIS — M48061 Spinal stenosis, lumbar region without neurogenic claudication: Secondary | ICD-10-CM

## 2013-02-18 DIAGNOSIS — I10 Essential (primary) hypertension: Secondary | ICD-10-CM

## 2013-02-18 DIAGNOSIS — R0989 Other specified symptoms and signs involving the circulatory and respiratory systems: Secondary | ICD-10-CM | POA: Diagnosis not present

## 2013-02-18 LAB — CBC
Hemoglobin: 10.3 g/dL — ABNORMAL LOW (ref 13.0–17.0)
MCH: 30.7 pg (ref 26.0–34.0)
MCV: 90.5 fL (ref 78.0–100.0)
RBC: 3.36 MIL/uL — ABNORMAL LOW (ref 4.22–5.81)

## 2013-02-18 LAB — BASIC METABOLIC PANEL
CO2: 25 mEq/L (ref 19–32)
Calcium: 8.5 mg/dL (ref 8.4–10.5)
Creatinine, Ser: 1.14 mg/dL (ref 0.50–1.35)
Glucose, Bld: 97 mg/dL (ref 70–99)
Potassium: 3.7 mEq/L (ref 3.5–5.1)
Sodium: 138 mEq/L (ref 135–145)

## 2013-02-18 LAB — GLUCOSE, CAPILLARY: Glucose-Capillary: 91 mg/dL (ref 70–99)

## 2013-02-18 MED ORDER — DEXTROSE-NACL 5-0.9 % IV SOLN
INTRAVENOUS | Status: DC
  Administered 2013-02-18: 17:00:00 via INTRAVENOUS
  Filled 2013-02-18 (×2): qty 1000

## 2013-02-18 MED ORDER — LORAZEPAM 2 MG/ML IJ SOLN
2.0000 mg | INTRAMUSCULAR | Status: DC | PRN
  Administered 2013-02-18: 2 mg via INTRAVENOUS
  Filled 2013-02-18: qty 1

## 2013-02-18 NOTE — Progress Notes (Signed)
Subjective: 3 Days Post-Op Procedure(s) (LRB): INTRAMEDULLARY (IM) NAIL INTERTROCHANTRIC (Right) Patient reports pain as mild and moderate.  Pt c/o sore throat from NG, RN in room to d/c NG tube. PT review doing well with surgical leg still having weakness and buckling with LLE requiring 2 person max assist.    Objective: Vital signs in last 24 hours: Temp:  [98.8 F (37.1 C)-99.7 F (37.6 C)] 98.8 F (37.1 C) (10/05 0434) Pulse Rate:  [92-110] 110 (10/05 0434) Resp:  [18] 18 (10/05 0434) BP: (144-154)/(75-78) 153/78 mmHg (10/05 0434) SpO2:  [90 %-100 %] 92 % (10/05 0434)  Intake/Output from previous day: 10/04 0701 - 10/05 0700 In: 2760 [I.V.:2760] Out: 2230 [Urine:1750; Emesis/NG output:480] Intake/Output this shift:     Recent Labs  02/15/13 1555 02/16/13 0451 02/17/13 0640 02/18/13 0602  HGB 12.7* 11.2* 10.0* 10.3*    Recent Labs  02/17/13 0640 02/18/13 0602  WBC 8.4 10.1  RBC 3.23* 3.36*  HCT 29.3* 30.4*  PLT 150 177    Recent Labs  02/17/13 0640 02/18/13 0602  NA 140 138  K 4.1 3.7  CL 104 101  CO2 24 25  BUN 25* 23  CREATININE 1.33 1.14  GLUCOSE 98 97  CALCIUM 8.1* 8.5   No results found for this basename: LABPT, INR,  in the last 72 hours  Sensation intact distally Intact pulses distally but diminished peripheral neuropathy at baseline Dorsiflexion/Plantar flexion intact Incision: dressing C/D/I Compartment soft  Assessment/Plan: 3 Days Post-Op Procedure(s) (LRB): INTRAMEDULLARY (IM) NAIL INTERTROCHANTRIC (Right) Up with therapy D/c planning SNF DVT proph lovenox Pain control as ordered WBAT   [name] 02/18/2013, 9:52 AM

## 2013-02-18 NOTE — Progress Notes (Signed)
02/18/13 1645 nsg RN was called to room per wife pt was getting combative, trying to pull lines and getting OOB.  RN talked to patient but seems to be getting sleepy. V/s taken 157/72 101 20 95% at 2 L.. MD notified. Patient calmed down bed alarm placed; patient trying to sleep at this time.

## 2013-02-18 NOTE — Progress Notes (Signed)
[facility] PROGRESS NOTE  [name]:[id number] DOB: 01/19/1932 DOA: 02/14/2013 PCP: No primary provider on file.   HPI/Subjective: Patient is a 79yo white male with a past med h/o HTN, Neuropathy, and enlarged prostate, presented to the [facility] ED yesterday after sustaining a fall at his home.  PMH is also significant for spinal stenosis causing lower extremity weakness.  In the ED, pt. was diagnosed with a displaced R intertrochanteric femoral Fx.  He was then transferred to [facility] for surgical repair.  Spinal surgery was scheduled with Dr. Jordan Likes on Friday but is now postponed.   Today c/o lower abdominal pain, wants NG out.   Assessment/Plan:  Right femur fracture w/ surgical repair: -Per Ortho. -Dr. Carola Frost placed intramedullary nail to repair on 10/2 -SNF at discharge.  Post Op Ileus -patient with severe nausea/vomiting on 10/3 - Abdomen xray shows improvement. - Will d/c the NG tube as patient is very uncomfortable. - Continue npo  Spinal Stenosis: -Sees Neurosurgery outpatient - Dr. Jordan Likes -Surgery was scheduled for 10/3-- postponed now -Erie Noe (Dr. Lindalou Hose Nurse) was notified of current hospitalization and informed to cancel surgery until cleared by Ortho. [facility] [phone])  HTN - BP is stable - Continue Hydralazine pn   Urinary retention / BPH -Flomax .4mg   -Had complaints of urinary retention -Foley inserted by Urology after 3 unsuccessful attempts by nursing staff; good flow was reported after insertion. -may need to be discharged with foley in place. -Minimal blood noticed coming from urethra; not worrisome  -F/u with PCP post-op for better medical control of obstructive symptoms . Anxiety - Will start ativan 2 mg IV q 4 hr prn  Code Status: Full Family Communication: Wife at bedside. Will d/c the NG tube. Disposition Plan:  inpatient/SNF for rehab post op.   Consultants:  Ortho  Procedures:  Intermedullary  Intertrochanteric Nail fixation of Right Intertrochanteric Hip Fx  N/G placed 10/3  Antibiotics:  3g Ancef one dose 10/1-- prophylactic for hip sx.    Objective: Filed Vitals:   02/18/13 1112  BP:   Pulse: 114  Temp:   Resp:     Intake/Output Summary (Last 24 hours) at 02/18/13 1621 Last data filed at 02/18/13 1139  Gross per 24 hour  Intake   2760 ml  Output   2980 ml  Net   -220 ml   Filed Weights   02/14/13 0033  Weight: 104.5 kg (230 lb 6.1 oz)    Exam:  Physical Exam: Head: Normocephalic, atraumatic.  Eyes: No signs of jaundice, EOMI Nose: Mucous membranes dry.  Throat: Oropharynx nonerythematous, no exudate appreciated.  Neck: supple,No deformities, masses, or tenderness noted. Lungs: Normal respiratory effort. B/L Clear to auscultation, no crackles or wheezes.  Heart: Regular RR. S1 and S2 normal  Abdomen: BS absent, distended, nontender to palpation  Extremities: No pretibial edema, no erythema   Data Reviewed: Basic Metabolic Panel:  Recent Labs Lab 02/14/13 0730 02/15/13 0500 02/15/13 1555 02/16/13 0451 02/17/13 0640 02/18/13 0602  NA 135 135  --  135 140 138  K 3.9 4.1  --  4.7 4.1 3.7  CL 97 98  --  101 104 101  CO2 23 25  --  25 24 25   GLUCOSE 113* 103*  --  140* 98 97  BUN 17 15  --  23 25* 23  CREATININE 1.41* 1.33 1.46* 1.54* 1.33 1.14  CALCIUM 9.2 8.6  --  8.5 8.1* 8.5   CBC  Recent Labs Lab 02/15/13 0500 02/15/13 1555  02/16/13 0451 02/17/13 0640 02/18/13 0602  WBC 10.7* 16.5* 12.8* 8.4 10.1  HGB 11.6* 12.7* 11.2* 10.0* 10.3*  HCT 34.7* 37.0* 32.7* 29.3* 30.4*  MCV 90.8 90.9 90.3 90.7 90.5  PLT 146* 143* 138* 150 177     Studies: Dg Abd 2 Views  02/18/2013   *RADIOLOGY REPORT*  Clinical Data: Ileus, subsequent encounter.  ABDOMEN - 2 VIEW  Comparison: 02/17/2013; 02/16/2013  Findings:  Persistent paucity of bowel gas.  Grossly unchanged moderate gaseous distension of a short segment of a solitary loop of small bowel  within the right upper abdominal quadrant measuring approximately 4.2 cm in diameter.  An enteric tube tip and side port project over the expected location of the gastric fundus.  No pneumoperitoneum, pneumatosis or portal venous gas.  Multilevel thoracolumbar spine degenerative change.  Post ORIF of the imaged right femur, incompletely evaluated.  IMPRESSION:  Persistent nonspecific moderate gas distension of a solitary loop of small bowel within the right upper abdominal quadrant.  There is an otherwise paucity of bowel gas which precludes evaluation for obstruction.   Original Report Authenticated By: Tacey Ruiz, MD   Dg Abd 2 Views  02/17/2013   CLINICAL DATA:  Evaluate ileus. Stomach pain.  EXAM: ABDOMEN - 2 VIEW  COMPARISON:  02/16/2013  FINDINGS: There is a nasogastric tube with tip in the proximal stomach. There has been significant improvement in dilated small bowel loops. Single loop of dilated small bowel persists within the right abdomen with associated air-fluid level. There is some gas and stool noted within the colon up to the rectum.  IMPRESSION: 1. Iinterval improvement in small bowel dilatation.   Electronically Signed   By: Signa Kell M.D.   On: 02/17/2013 07:49    Scheduled Meds: . enoxaparin (LOVENOX) injection  40 mg Subcutaneous Q24H   Continuous Infusions: . sodium chloride 75 mL/hr at 02/18/13 1426    Principal Problem:   Displaced intertrochanteric fracture of right femur Active Problems:   Spinal stenosis of lumbar region   Hypertension   Ileus  [name] [phone] [facility] 02/18/2013, 4:21 PM  LOS: 4 days

## 2013-02-19 ENCOUNTER — Inpatient Hospital Stay (HOSPITAL_COMMUNITY): Payer: Medicare Other

## 2013-02-19 DIAGNOSIS — M48061 Spinal stenosis, lumbar region without neurogenic claudication: Secondary | ICD-10-CM | POA: Diagnosis not present

## 2013-02-19 DIAGNOSIS — I10 Essential (primary) hypertension: Secondary | ICD-10-CM | POA: Diagnosis not present

## 2013-02-19 DIAGNOSIS — K5730 Diverticulosis of large intestine without perforation or abscess without bleeding: Secondary | ICD-10-CM | POA: Diagnosis not present

## 2013-02-19 DIAGNOSIS — S72143A Displaced intertrochanteric fracture of unspecified femur, initial encounter for closed fracture: Secondary | ICD-10-CM | POA: Diagnosis not present

## 2013-02-19 DIAGNOSIS — K560 Paralytic ileus: Secondary | ICD-10-CM | POA: Diagnosis not present

## 2013-02-19 MED ORDER — ACETAMINOPHEN 650 MG RE SUPP: 650.0000 mg | Freq: Four times a day (QID) | RECTAL | Status: DC | PRN

## 2013-02-19 MED ORDER — ACETAMINOPHEN 325 MG PO TABS: 325.0000 mg | ORAL_TABLET | Freq: Four times a day (QID) | ORAL | Status: DC | PRN

## 2013-02-19 MED ORDER — ACETAMINOPHEN 325 MG PO TABS: 650.0000 mg | ORAL_TABLET | Freq: Four times a day (QID) | ORAL | Status: DC | PRN

## 2013-02-19 MED ORDER — METHOCARBAMOL 500 MG PO TABS
500.0000 mg | ORAL_TABLET | Freq: Four times a day (QID) | ORAL | Status: DC | PRN
Start: 2013-02-19 — End: 2013-02-20
  Filled 2013-02-19: qty 2

## 2013-02-19 MED ORDER — SODIUM CHLORIDE 0.9 % IV SOLN
INTRAVENOUS | Status: DC
  Administered 2013-02-19 (×2): via INTRAVENOUS
  Filled 2013-02-19 (×4): qty 1000

## 2013-02-19 MED ORDER — IBUPROFEN 800 MG PO TABS
800.0000 mg | ORAL_TABLET | Freq: Four times a day (QID) | ORAL | Status: DC | PRN
  Administered 2013-02-19 – 2013-02-20 (×2): 800 mg via ORAL
  Filled 2013-02-19 (×3): qty 1

## 2013-02-19 MED ORDER — IOHEXOL 300 MG/ML  SOLN
25.0000 mL | INTRAMUSCULAR | Status: AC
  Administered 2013-02-19 (×2): 25 mL via ORAL

## 2013-02-19 MED ORDER — ACETAMINOPHEN 500 MG PO TABS
1000.0000 mg | ORAL_TABLET | Freq: Three times a day (TID) | ORAL | Status: DC
  Filled 2013-02-19: qty 2

## 2013-02-19 MED ORDER — METOCLOPRAMIDE HCL 5 MG/ML IJ SOLN
5.0000 mg | Freq: Three times a day (TID) | INTRAMUSCULAR | Status: DC
  Administered 2013-02-19 – 2013-02-20 (×5): 5 mg via INTRAVENOUS
  Filled 2013-02-19 (×8): qty 1

## 2013-02-19 MED ORDER — HYDROCODONE-ACETAMINOPHEN 5-325 MG PO TABS: 1.0000 | ORAL_TABLET | Freq: Four times a day (QID) | ORAL | Status: DC | PRN

## 2013-02-19 NOTE — Progress Notes (Signed)
I have reviewed this note and agree with all findings. Kati Otillia Cordone, PT, DPT Pager: [phone]   

## 2013-02-19 NOTE — Progress Notes (Addendum)
[facility] PROGRESS NOTE  [name] ZOX:[id number] DOB: [birth year] DOA: 02/14/2013 PCP: No primary provider on file.   HPI/Subjective: Patient is a 79yo white male with a past med h/o HTN, Neuropathy, and enlarged prostate, presented to the [facility] ED on after sustaining a fall at his home.  PMH is also significant for spinal stenosis causing lower extremity weakness.  In the ED, pt. was diagnosed with a displaced R intertrochanteric femoral Fx.  He was then transferred to [facility] for surgical repair.  Spinal surgery was scheduled with Dr. Jordan Likes on Friday but is now postponed.  Pt. Is in discomfort with moderate nausea and dry heaves.  Claims pain in under control.   Assessment/Plan:  Post Op Ileus -Abdomen xray shows improvement. 2 small BM within past 24hrs. -Patient with severe nausea/vomiting since post op. Has standing order for Zofran. -Reglan added back.  Will request aromatherapy. -NG was placed 10/3 >> d/c 10/5 -K was 3.7 on 10/5 will start IVF with 40 meq for 20 hours.  (goal 4.5 - 5.0) -Mild right sided tenderness to palpation with hypoactive BS -Advanced to clear diet as tolerable  Right femur fracture w/ surgical repair: -Per Ortho. -Dr. Carola Frost placed intramedullary nail to repair on 10/2 -SNF at discharge. -Daily in-room Physical Therapy visits  Spinal Stenosis: -Sees Neurosurgery outpatient - Dr. Jordan Likes -Surgery was scheduled for 10/3-- postponed now -Erie Noe (Dr. Lindalou Hose Nurse) was notified of current hospitalization and informed to cancel surgery until cleared by Ortho. [facility] [phone])  HTN - BP is stable - Continue Hydralazine pn  Urinary retention / BPH -Flomax .4mg   -Had complaints of urinary retention -Foley inserted by Urology after 3 unsuccessful attempts by nursing staff; good flow was reported after insertion. -May need to be discharged with foley in place. -Minimal blood noticed coming from urethra; not worrisome  -F/u with  PCP post-op for better medical control of obstructive symptoms  Delirium - Ativan q4h prn was dosed yesterday.  Pt. did not tolerate well.  DC'd  - resolved   Code Status: Full Family Communication: Wife at bedside. Disposition Plan:  Inpatient/SNF for rehab post op.   Consultants:  Ortho  Procedures:  Intermedullary Intertrochanteric Nail fixation of Right Intertrochanteric Hip Fx  N/G placed 10/3 >> 10/5  Antibiotics:  3g Ancef one dose 10/1-- prophylactic for hip sx.    Objective: Filed Vitals:   02/19/13 0431  BP: 175/77  Pulse: 106  Temp: 99.9 F (37.7 C)  Resp: 18    Intake/Output Summary (Last 24 hours) at 02/19/13 1136 Last data filed at 02/19/13 1027  Gross per 24 hour  Intake 1363.75 ml  Output   3025 ml  Net -1661.25 ml   Filed Weights   02/14/13 0033  Weight: 104.5 kg (230 lb 6.1 oz)    Exam:  Physical Exam: General: awake, alert, in discomfort with nausea and dry heaves  Cardiovascular: RRR, no murmur, gallop, rubs, Normal S1/S2, Cap refill 2+ in extremities  Respiratory: CTAB, no w/r/r  Abdomen: Distended, firm but not tight, mildly tender along right side, no palpable masses, hypoactive BS in all four quadrants.  Musculoskeletal: Able to move all extremities w/o difficulty.  5/5 MMT with all ankle ROM. No calf tenderness, swelling, or erythema. Neruo: Sensation intact in all extremities, no focal deficits.    Data Reviewed: Basic Metabolic Panel:  Recent Labs Lab 02/14/13 0730 02/15/13 0500 02/15/13 1555 02/16/13 0451 02/17/13 0640 02/18/13 0602  NA 135 135  --  135 140 138  K 3.9 4.1  --  4.7 4.1 3.7  CL 97 98  --  101 104 101  CO2 23 25  --  25 24 25   GLUCOSE 113* 103*  --  140* 98 97  BUN 17 15  --  23 25* 23  CREATININE 1.41* 1.33 1.46* 1.54* 1.33 1.14  CALCIUM 9.2 8.6  --  8.5 8.1* 8.5   CBC  Recent Labs Lab 02/15/13 0500 02/15/13 1555 02/16/13 0451 02/17/13 0640 02/18/13 0602  WBC 10.7* 16.5* 12.8* 8.4 10.1   HGB 11.6* 12.7* 11.2* 10.0* 10.3*  HCT 34.7* 37.0* 32.7* 29.3* 30.4*  MCV 90.8 90.9 90.3 90.7 90.5  PLT 146* 143* 138* 150 177     Studies: Dg Chest Port 1 View  02/18/2013   CLINICAL DATA:  Congestion. Pneumonia.  EXAM: PORTABLE CHEST - 1 VIEW  COMPARISON:  02/13/2013, [facility] hospital.  FINDINGS: Low volume chest. Basilar atelectasis. Low volumes accentuate the cardiopericardial silhouette. Retrocardiac density is present most compatible with atelectasis. There is no focal consolidation identified.  IMPRESSION: Low volume chest. No acute cardiopulmonary disease identified.   Electronically Signed   By: Andreas Newport M.D.   On: 02/18/2013 19:18   Dg Abd 2 Views  02/18/2013   *RADIOLOGY REPORT*  Clinical Data: Ileus, subsequent encounter.  ABDOMEN - 2 VIEW  Comparison: 02/17/2013; 02/16/2013  Findings:  Persistent paucity of bowel gas.  Grossly unchanged moderate gaseous distension of a short segment of a solitary loop of small bowel within the right upper abdominal quadrant measuring approximately 4.2 cm in diameter.  An enteric tube tip and side port project over the expected location of the gastric fundus.  No pneumoperitoneum, pneumatosis or portal venous gas.  Multilevel thoracolumbar spine degenerative change.  Post ORIF of the imaged right femur, incompletely evaluated.  IMPRESSION:  Persistent nonspecific moderate gas distension of a solitary loop of small bowel within the right upper abdominal quadrant.  There is an otherwise paucity of bowel gas which precludes evaluation for obstruction.   Original Report Authenticated By: Tacey Ruiz, MD    Scheduled Meds: . enoxaparin (LOVENOX) injection  40 mg Subcutaneous Q24H  . metoCLOPramide (REGLAN) injection  5 mg Intravenous TID AC & HS   Continuous Infusions: . sodium chloride 0.9 % 1,000 mL with potassium chloride 40 mEq infusion      Principal Problem:   Displaced intertrochanteric fracture of right femur Active Problems:    Spinal stenosis of lumbar region   Hypertension   Ileus  Rob Bunting, PA-S2, Physician Assistant Student Algis Downs, PA-C [phone] [facility] 02/19/2013, 11:36 AM  LOS: 5 days    Addendum  Patient seen and examined, chart and data base reviewed.  I agree with the above assessment and plan.  For full details please see Mrs. Algis Downs PA note.   Clint Lipps, MD [facility] Pager: [phone] 02/19/2013, 11:36 AM

## 2013-02-19 NOTE — Progress Notes (Signed)
[facility] Progress Note  Subjective  Doing ok this am Combative and confused yesterday evening NG removed yesterday  Had a bath yesterday as well  Pain R leg 3-4/10  About to try clears Foley remains in  Mobilization somewhat limited over the weekend.  LLEx weakness will be a barrier to safe and effective mobilization    +flatus and BM No Vomiting  Some nausea this am  No CP, SOB No abd pain   Objective   BP 175/77  Pulse 106  Temp(Src) 99.9 F (37.7 C) (Oral)  Resp 18  Ht 5\' 10"  (1.778 m)  Wt 104.5 kg (230 lb 6.1 oz)  BMI 33.06 kg/m2  SpO2 96%  Intake/Output     10/05 0701 - 10/06 0700 10/06 0701 - 10/07 0700   I.V. (mL/kg) 1303.8 (12.5)    Total Intake(mL/kg) 1303.8 (12.5)    Urine (mL/kg/hr) 2300 (0.9)    Emesis/NG output     Total Output 2300     Net -996.3          Stool Occurrence 2 x      Labs No new labs this am   Exam  Gen: awake and alert, tired appearing. Wife at bedside Lungs: clear anterior fields Cardiac: s1 and s2 Abd: soft, NT, infrequent bowel sounds Ext:       Right Lower Extremity   Dressings stable  Swelling stable  TED hose in place  Distal motor and sensory functions intact  Ext warm, + DP pulse    Assessment and Plan   POD#: 72   79 y/o male s/p fall with R peritroch hip fracture  1. Fall 2. R peritrochanteric hip fracture POD 4 s/p IMN  WBAT  ROM as tolerated  PT/OT  Ice prn  Dressing changes prn   3. Post op ileus  Pt advancing to clears today  Continue to monitor  Need to mobilize pt as much as possible. LLEx weakness will be a limiting factor, in addition to operative extremity   Minimize narcs   4. DVT/PE prophylaxis  Lovenox   TED  5. Pain  Use tylenol first, then low dose norco if pain uncontrolled  D/c ketorolac, NSAIDs are known to decrease bone healing capacity due to prostaglandin inhibition   6. FEN  Per #3  Given limited mobility thus far would continue with foley, once pt  more mobile can consider voiding trial, this may need to be done at snf   U/A and urine cx need to be collected but have been ordered  7. Medical issues  Per primary service  8. dispo  PT/OT  Monitor ileus  Not clinically stable for d/c  Suspect pt may be ready by mid-week   Will continue to follow    Mearl Latin, PA-C [facility] [phone] (P) 02/19/2013 9:25 AM

## 2013-02-19 NOTE — Progress Notes (Signed)
Physical Therapy Treatment Patient Details Name: [name] MRN: [id number] DOB: [birth year] Today's Date: 02/19/2013 Time: 4098-1191 PT Time Calculation (min): 30 min  PT Assessment / Plan / Recommendation  History of Present Illness Pt is a 79 yo male admitted on 02/14/13 after a fall resulting in R hip fracture.  Pt underwent an ORIF with IM nail and is now WBAT.  Pt was scheduled to have back surgery with Dr. Jordan Likes today for stenosis which has been postponed.  The pain from stenosis affected L hip and back therefore pt very weak on left side. Remote h/o left TKA.   PT Comments   Pt's primary limiting factor to participating in PT is R LE pain. Pt able to perform sit<>stand transfer x2 with +2 assist with less assist required during second sit to stand. Pt performed standing marches with assist, within RW, as a pre-gait activity.  Pt able to complete LE strengthening exercises in supine. Pt would continue to benefit from skilled PT in order to improve safety and functional mobility.  Follow Up Recommendations  SNF;Supervision/Assistance - 24 hour     Does the patient have the potential to tolerate intense rehabilitation     Barriers to Discharge        Equipment Recommendations  None recommended by PT    Recommendations for Other Services    Frequency Min 3X/week   Progress towards PT Goals Progress towards PT goals: Progressing toward goals  Plan Current plan remains appropriate    Precautions / Restrictions Precautions Precautions: Fall Precaution Comments: hx several falls due to RLE weakness from back issues Restrictions Weight Bearing Restrictions: Yes RLE Weight Bearing: Weight bearing as tolerated   Pertinent Vitals/Pain Pt reports no pain at rest, with increase of RLE pain during all mobility (7-8/10), RN notified and pt positioned to comfort at end of session.    Mobility  Bed Mobility Bed Mobility: Not assessed Details for Bed Mobility Assistance: pt up in recliner  upon arrival Transfers Transfers: Stand to Sit;Sit to Stand Sit to Stand: 1: +2 Total assist;With upper extremity assist;From chair/3-in-1 Sit to Stand: Patient Percentage: 40% Stand to Sit: With upper extremity assist;1: +2 Total assist;To chair/3-in-1 Stand to Sit: Patient Percentage: 40% Details for Transfer Assistance: pt performed sit <> stands x2 with RW for strengthening and instruction, pt able to progress to assisting 60% on 2nd stand, verbal cues for hand placement and keeping R LE forward for pain control, assist to rise and steady as well as control descent, pt able to tolerate marching in place with +2 pt=60% x8 with each stand before fatiguing and request to return to sitting Ambulation/Gait Ambulation/Gait Assistance: Not tested (comment)    Exercises General Exercises - Lower Extremity Ankle Circles/Pumps: AROM;20 reps;Supine;Both (rest breaks require due to pain/fatigue during LE exercises.) Quad Sets: AROM;Supine;Both;20 reps Gluteal Sets: AROM;Both;10 reps Heel Slides: AROM;10 reps;Both;Supine   PT Diagnosis:    PT Problem List:   PT Treatment Interventions:     PT Goals (current goals can now be found in the care plan section)    Visit Information  Last PT Received On: 02/19/13 Assistance Needed: +2 History of Present Illness: Pt is a 79 yo male admitted on 02/14/13 after a fall resulting in R hip fracture.  Pt underwent an ORIF with IM nail and is now WBAT.  Pt was scheduled to have back surgery with Dr. Jordan Likes today for stenosis which has been postponed.  The pain from stenosis affected L hip and back therefore  pt very weak on left side. Remote h/o left TKA.    Subjective Data      Cognition  Cognition Arousal/Alertness: Awake/alert Behavior During Therapy: WFL for tasks assessed/performed Overall Cognitive Status: Within Functional Limits for tasks assessed    Balance     End of Session PT - End of Session Equipment Utilized During Treatment: Gait  belt;Oxygen Activity Tolerance: Patient limited by pain Patient left: with call bell/phone within reach;in chair Nurse Communication: Patient requests pain meds   GP     [name] 02/19/2013, 4:29 PM

## 2013-02-20 DIAGNOSIS — S72009D Fracture of unspecified part of neck of unspecified femur, subsequent encounter for closed fracture with routine healing: Secondary | ICD-10-CM | POA: Diagnosis not present

## 2013-02-20 DIAGNOSIS — I951 Orthostatic hypotension: Secondary | ICD-10-CM | POA: Diagnosis not present

## 2013-02-20 DIAGNOSIS — R339 Retention of urine, unspecified: Secondary | ICD-10-CM

## 2013-02-20 DIAGNOSIS — N401 Enlarged prostate with lower urinary tract symptoms: Secondary | ICD-10-CM | POA: Diagnosis not present

## 2013-02-20 DIAGNOSIS — M4800 Spinal stenosis, site unspecified: Secondary | ICD-10-CM | POA: Diagnosis not present

## 2013-02-20 DIAGNOSIS — N509 Disorder of male genital organs, unspecified: Secondary | ICD-10-CM | POA: Diagnosis not present

## 2013-02-20 DIAGNOSIS — N390 Urinary tract infection, site not specified: Secondary | ICD-10-CM | POA: Diagnosis not present

## 2013-02-20 DIAGNOSIS — K560 Paralytic ileus: Secondary | ICD-10-CM | POA: Diagnosis not present

## 2013-02-20 DIAGNOSIS — M159 Polyosteoarthritis, unspecified: Secondary | ICD-10-CM | POA: Diagnosis not present

## 2013-02-20 DIAGNOSIS — Z5189 Encounter for other specified aftercare: Secondary | ICD-10-CM | POA: Diagnosis not present

## 2013-02-20 DIAGNOSIS — G4700 Insomnia, unspecified: Secondary | ICD-10-CM | POA: Diagnosis not present

## 2013-02-20 DIAGNOSIS — S72143A Displaced intertrochanteric fracture of unspecified femur, initial encounter for closed fracture: Secondary | ICD-10-CM | POA: Diagnosis not present

## 2013-02-20 DIAGNOSIS — F411 Generalized anxiety disorder: Secondary | ICD-10-CM | POA: Diagnosis not present

## 2013-02-20 DIAGNOSIS — M79609 Pain in unspecified limb: Secondary | ICD-10-CM | POA: Diagnosis not present

## 2013-02-20 DIAGNOSIS — I739 Peripheral vascular disease, unspecified: Secondary | ICD-10-CM | POA: Diagnosis not present

## 2013-02-20 DIAGNOSIS — N452 Orchitis: Secondary | ICD-10-CM | POA: Diagnosis not present

## 2013-02-20 DIAGNOSIS — N5089 Other specified disorders of the male genital organs: Secondary | ICD-10-CM | POA: Diagnosis not present

## 2013-02-20 DIAGNOSIS — I10 Essential (primary) hypertension: Secondary | ICD-10-CM | POA: Diagnosis not present

## 2013-02-20 DIAGNOSIS — F329 Major depressive disorder, single episode, unspecified: Secondary | ICD-10-CM | POA: Diagnosis not present

## 2013-02-20 DIAGNOSIS — S72009A Fracture of unspecified part of neck of unspecified femur, initial encounter for closed fracture: Secondary | ICD-10-CM | POA: Diagnosis not present

## 2013-02-20 DIAGNOSIS — R1084 Generalized abdominal pain: Secondary | ICD-10-CM | POA: Diagnosis not present

## 2013-02-20 DIAGNOSIS — R42 Dizziness and giddiness: Secondary | ICD-10-CM | POA: Diagnosis not present

## 2013-02-20 DIAGNOSIS — R262 Difficulty in walking, not elsewhere classified: Secondary | ICD-10-CM | POA: Diagnosis not present

## 2013-02-20 DIAGNOSIS — S7223XA Displaced subtrochanteric fracture of unspecified femur, initial encounter for closed fracture: Secondary | ICD-10-CM | POA: Diagnosis not present

## 2013-02-20 DIAGNOSIS — G609 Hereditary and idiopathic neuropathy, unspecified: Secondary | ICD-10-CM | POA: Diagnosis not present

## 2013-02-20 DIAGNOSIS — M6281 Muscle weakness (generalized): Secondary | ICD-10-CM | POA: Diagnosis not present

## 2013-02-20 DIAGNOSIS — N400 Enlarged prostate without lower urinary tract symptoms: Secondary | ICD-10-CM | POA: Diagnosis not present

## 2013-02-20 DIAGNOSIS — B351 Tinea unguium: Secondary | ICD-10-CM | POA: Diagnosis not present

## 2013-02-20 DIAGNOSIS — G4720 Circadian rhythm sleep disorder, unspecified type: Secondary | ICD-10-CM | POA: Diagnosis not present

## 2013-02-20 DIAGNOSIS — I959 Hypotension, unspecified: Secondary | ICD-10-CM | POA: Diagnosis not present

## 2013-02-20 DIAGNOSIS — M25559 Pain in unspecified hip: Secondary | ICD-10-CM | POA: Diagnosis not present

## 2013-02-20 HISTORY — DX: Retention of urine, unspecified: R33.9

## 2013-02-20 LAB — CBC
HCT: 31.5 % — ABNORMAL LOW (ref 39.0–52.0)
MCH: 30.9 pg (ref 26.0–34.0)
MCHC: 34.6 g/dL (ref 30.0–36.0)
Platelets: 218 10*3/uL (ref 150–400)
RDW: 14.1 % (ref 11.5–15.5)
WBC: 9.5 10*3/uL (ref 4.0–10.5)

## 2013-02-20 LAB — BASIC METABOLIC PANEL
BUN: 22 mg/dL (ref 6–23)
Calcium: 8.7 mg/dL (ref 8.4–10.5)
Creatinine, Ser: 1.24 mg/dL (ref 0.50–1.35)
GFR calc Af Amer: 62 mL/min — ABNORMAL LOW (ref >90)
GFR calc non Af Amer: 54 mL/min — ABNORMAL LOW (ref >90)
Glucose, Bld: 103 mg/dL — ABNORMAL HIGH (ref 70–99)
Potassium: 4.5 mEq/L (ref 3.5–5.1)
Sodium: 140 mEq/L (ref 135–145)

## 2013-02-20 MED ORDER — ALLOPURINOL 300 MG PO TABS
300.0000 mg | ORAL_TABLET | Freq: Every morning | ORAL | Status: DC
  Administered 2013-02-20: 12:00:00 300 mg via ORAL
  Filled 2013-02-20: qty 1

## 2013-02-20 MED ORDER — RIVAROXABAN 10 MG PO TABS: 10.0000 mg | ORAL_TABLET | Freq: Every day | ORAL | Status: DC

## 2013-02-20 MED ORDER — TAMSULOSIN HCL 0.4 MG PO CAPS: 0.8000 mg | ORAL_CAPSULE | Freq: Every day | ORAL | Status: DC

## 2013-02-20 MED ORDER — IBUPROFEN 800 MG PO TABS: 800.0000 mg | ORAL_TABLET | Freq: Four times a day (QID) | ORAL | Status: DC | PRN

## 2013-02-20 MED ORDER — ZOLPIDEM TARTRATE 5 MG PO TABS: 5.0000 mg | ORAL_TABLET | Freq: Every day | ORAL | Status: DC

## 2013-02-20 MED ORDER — AMLODIPINE BESYLATE 10 MG PO TABS
10.0000 mg | ORAL_TABLET | Freq: Every morning | ORAL | Status: DC
  Administered 2013-02-20: 10 mg via ORAL
  Filled 2013-02-20: qty 1

## 2013-02-20 MED ORDER — IRBESARTAN 300 MG PO TABS
300.0000 mg | ORAL_TABLET | Freq: Every morning | ORAL | Status: DC
  Administered 2013-02-20: 12:00:00 300 mg via ORAL
  Filled 2013-02-20: qty 1

## 2013-02-20 MED ORDER — ZOLPIDEM TARTRATE 10 MG PO TABS: 10.0000 mg | ORAL_TABLET | Freq: Every day | ORAL | Status: DC

## 2013-02-20 MED ORDER — TAMSULOSIN HCL 0.4 MG PO CAPS
0.4000 mg | ORAL_CAPSULE | Freq: Every day | ORAL | Status: DC
  Filled 2013-02-20: qty 1

## 2013-02-20 MED ORDER — ONDANSETRON HCL 4 MG PO TABS: 4.0000 mg | ORAL_TABLET | Freq: Four times a day (QID) | ORAL | Status: DC | PRN

## 2013-02-20 MED ORDER — GABAPENTIN 600 MG PO TABS
600.0000 mg | ORAL_TABLET | Freq: Two times a day (BID) | ORAL | Status: DC
  Administered 2013-02-20: 600 mg via ORAL
  Filled 2013-02-20 (×2): qty 1

## 2013-02-20 NOTE — Progress Notes (Signed)
Patient was discharged by ambulance. Patient was discharged with couday catheter in place. Patient was stable upon discharged wife was at bedside. Report was called prior to patient leaving.

## 2013-02-20 NOTE — Care Management Note (Signed)
   CARE MANAGEMENT NOTE 02/20/2013  Patient:  [name], [name]   Account Number:  [id number]  Date Initiated:  02/14/2013  Documentation initiated by:  Alston Berrie  Subjective/Objective Assessment:   CM referral for  pt with hip fx, awaiting surgery.  10/7 Slow recovery due to post op ileus.     Action/Plan:   Await surgery and PT eval for d/c needs.  10/7 post op ileus improving, delaying rehab will need ST SNF.   Anticipated DC Date:  02/22/2013   Anticipated DC Plan:           Choice offered to / List presented to:             Status of service:  In process, will continue to follow Medicare Important Message given?   (If response is "NO", the following Medicare IM given date fields will be blank) Date Medicare IM given:   Date Additional Medicare IM given:    Discharge Disposition:    Per UR Regulation:    If discussed at Long Length of Stay Meetings, dates discussed:    Comments:

## 2013-02-20 NOTE — Progress Notes (Signed)
Occupational Therapy Treatment Patient Details Name: Ksean Vale MRN: [id number] DOB: Jan 09, 1932 Today's Date: 02/20/2013 Time: 2130-8657 OT Time Calculation (min): 28 min  OT Assessment / Plan / Recommendation  History of present illness Pt is a 79 yo male admitted on 02/14/13 after a fall resulting in R hip fracture.  Pt underwent an ORIF with IM nail and is now WBAT.  Pt was scheduled to have back surgery with Dr. Jordan Likes today for stenosis which has been postponed.  The pain from stenosis affected L hip and back therefore pt very weak on left side. Remote h/o left TKA.   OT comments  Pt doing much better today with adls and mobility.  Pt able to transfer sit to stand with +1 instead of +2 for first time today.  Pt remained standing with SBA for 3 minutes and a walker.  Follow Up Recommendations  SNF;Supervision/Assistance - 24 hour    Barriers to Discharge       Equipment Recommendations  None recommended by OT    Recommendations for Other Services    Frequency Min 2X/week   Progress towards OT Goals Progress towards OT goals: Progressing toward goals  Plan Discharge plan remains appropriate    Precautions / Restrictions Precautions Precautions: Fall Restrictions Weight Bearing Restrictions: Yes RLE Weight Bearing: Weight bearing as tolerated   Pertinent Vitals/Pain Pt with no pain until he moves.  During activity pt with 4/10 pain in R hip.    ADL  Eating/Feeding: Performed;Independent Where Assessed - Eating/Feeding: Chair Grooming: Performed;Wash/dry hands;Wash/dry face;Set up Where Assessed - Grooming: Unsupported sitting Upper Body Bathing: Performed;Set up Where Assessed - Upper Body Bathing: Unsupported sitting Lower Body Bathing: Performed;Moderate assistance Where Assessed - Lower Body Bathing: Supported sit to stand Upper Body Dressing: Performed;Set up Where Assessed - Upper Body Dressing: Unsupported sitting Equipment Used: Standard  walker Transfers/Ambulation Related to ADLs: Pt completed sit to stand transfer x2 with max A x1.  Once standing, pt able to stand for 2 minutes before becoming SOB and returning to sitting. ADL Comments: pt now donig all UE adls for self.  Pt able to bathe most of LE but needs great assist coming sit to stand due to weakness in BLEs.    OT Diagnosis:    OT Problem List:   OT Treatment Interventions:     OT Goals(current goals can now be found in the care plan section) Acute Rehab OT Goals Patient Stated Goal: to get well. OT Goal Formulation: With patient/family Time For Goal Achievement: 03/02/13 Potential to Achieve Goals: Good ADL Goals Pt Will Perform Lower Body Bathing: with mod assist;sit to/from stand Pt Will Perform Lower Body Dressing: with mod assist;sit to/from stand;with adaptive equipment Pt Will Transfer to Toilet: with mod assist;bedside commode;stand pivot transfer Pt Will Perform Toileting - Clothing Manipulation and hygiene: with mod assist;sit to/from stand  Visit Information  Last OT Received On: 02/20/13 Assistance Needed: +2 History of Present Illness: Pt is a 79 yo male admitted on 02/14/13 after a fall resulting in R hip fracture.  Pt underwent an ORIF with IM nail and is now WBAT.  Pt was scheduled to have back surgery with Dr. Jordan Likes today for stenosis which has been postponed.  The pain from stenosis affected L hip and back therefore pt very weak on left side. Remote h/o left TKA.    Subjective Data      Prior Functioning       Cognition  Cognition Arousal/Alertness: Awake/alert Behavior During Therapy: [facility] for  tasks assessed/performed Overall Cognitive Status: Within Functional Limits for tasks assessed    Mobility  Bed Mobility Bed Mobility: Not assessed Details for Bed Mobility Assistance: pt up in recliner upon arrival Transfers Transfers: Sit to Stand;Stand to Sit Sit to Stand: 2: Max assist;With upper extremity assist;From chair/3-in-1 Stand  to Sit: 2: Max assist;To chair/3-in-1;With upper extremity assist Details for Transfer Assistance: Pt transferred sit to stand with max Ax1 for two attempts.  Once standing, pt able to pick up feet in place and stand for appx 2-3 minutes.    Exercises      Balance Balance Balance Assessed: Yes Static Standing Balance Static Standing - Balance Support: Bilateral upper extremity supported;During functional activity Static Standing - Level of Assistance: 5: Stand by assistance Static Standing - Comment/# of Minutes: 3   End of Session OT - End of Session Equipment Utilized During Treatment: Gait belt;Rolling walker Activity Tolerance: Patient tolerated treatment well Patient left: in chair;with call bell/phone within reach;with family/visitor present Nurse Communication: Mobility status  GO     Hope Budds 02/20/2013, 11:47 AM [phone]

## 2013-02-20 NOTE — Progress Notes (Signed)
[facility] Progress Note  Subjective  Doing better this am Back and LLEx weakness impacting ability to participate in therapies  Tolerating liquid diet + flatus No abd pain    Objective   BP 145/84  Pulse 67  Temp(Src) 97.8 F (36.6 C) (Oral)  Resp 18  Ht 5\' 10"  (1.778 m)  Wt 104.5 kg (230 lb 6.1 oz)  BMI 33.06 kg/m2  SpO2 94%  Intake/Output     10/06 0701 - 10/07 0700 10/07 0701 - 10/08 0700   P.O. 780    I.V. (mL/kg) 321.7 (3.1)    Total Intake(mL/kg) 1101.7 (10.5)    Urine (mL/kg/hr) 1475 (0.6)    Total Output 1475     Net -373.3          Urine Occurrence     Stool Occurrence 6 x      Labs Results for Shawn, Alvarez (MRN [id number]) as of 02/20/2013 08:46  Ref. Range 02/20/2013 06:45  Sodium Latest Range: 135-145 mEq/L 140  Potassium Latest Range: 3.5-5.1 mEq/L 4.5  Chloride Latest Range: 96-112 mEq/L 105  CO2 Latest Range: 19-32 mEq/L 25  BUN Latest Range: 6-23 mg/dL 22  Creatinine Latest Range: 0.50-1.35 mg/dL 9.56  Calcium Latest Range: 8.4-10.5 mg/dL 8.7  GFR calc non Af Amer Latest Range: >90 mL/min 54 (L)  GFR calc Af Amer Latest Range: >90 mL/min 62 (L)  Glucose Latest Range: 70-99 mg/dL 213 (H)  WBC Latest Range: 4.0-10.5 K/uL 9.5  RBC Latest Range: 4.22-5.81 MIL/uL 3.53 (L)  Hemoglobin Latest Range: 13.0-17.0 g/dL 08.6 (L)  HCT Latest Range: 39.0-52.0 % 31.5 (L)  MCV Latest Range: 78.0-100.0 fL 89.2  MCH Latest Range: 26.0-34.0 pg 30.9  MCHC Latest Range: 30.0-36.0 g/dL 57.8  RDW Latest Range: 11.5-15.5 % 14.1  Platelets Latest Range: 150-400 K/uL 218    Exam  Gen: awake and alert, resting comfortably in bed, eating breakfast, NAD Abd: + BS, NT Ext:       Right Lower Extremity               Dressings stable  Incisions look good   No signs of infection              Swelling stable             TED hose in place             Distal motor and sensory functions intact             Ext warm, + DP pulse    Assessment and  Plan     POD#: 72   79 y/o male s/p fall with R peritroch hip fracture  1. Fall 2. R peritrochanteric hip fracture POD 5 s/p IMN             WBAT             ROM as tolerated             PT/OT             Ice prn             Dressing changes prn              3. Post op ileus             improving  Continue to monitor  Mobilize   Serum K improved               4. DVT/PE prophylaxis  Lovenox               TED  5. Pain             Use tylenol first, then low dose norco if pain uncontrolled               6. FEN             pt advancing to full liquid diet   Continue with foley for now  Mobility still restricted. May need to dc to snf with foley and perform voiding trial at SNF   7. Medical issues             Per primary service  8. dispo             PT/OT             Monitor ileus             clinically improving  If tolerates diet advancement may be able to dc tomorrow     Mearl Latin, PA-C [facility] [phone] (P) 02/20/2013 8:45 AM

## 2013-02-20 NOTE — Discharge Summary (Addendum)
Physician Discharge Summary  [name] AVW:[id number] DOB: 1932/01/05 DOA: 02/14/2013  PCP: Desmond Dike, MD  Admit date: 02/14/2013 Discharge date: 02/20/2013  Time spent: 60 minutes  Recommendations for Outpatient Follow-up:  1. WBAT with assist, PT, OT at skilled nursing facility 2. Monitor for infection at site of surgery 3. Urology follow up to remove foley 4. Orthopedic follow up. 5. BMET / CBC in 3-4 days. (patient with severe ileus that has just resolved). 6. Reschedule with Dr. Jordan Likes of Neurosurgery. 7. Rivaroxaban for post hip DVT prophylaxis.  Discontinue this medication on 03/23/2013  Discharge Diagnoses:  Principal Problem:   Displaced intertrochanteric fracture of right femur Active Problems:   Spinal stenosis of lumbar region   Hypertension   Ileus   Urinary retention Nausea/Vomiting  Discharge Condition: Stable, patient eager to be discharged  Diet recommendation: Heart healthy  Filed Weights   02/14/13 0033  Weight: 104.5 kg (230 lb 6.1 oz)    History of present illness:  Patient is a 79yo white male with a past med h/o HTN, Neuropathy, and enlarged prostate, presented to the [place] ED on after sustaining a fall at his home. PMH is also significant for spinal stenosis causing lower extremity weakness. In the ED, pt. was diagnosed with a displaced R intertrochanteric femoral Fx. He was then transferred to [facility] for surgical repair. Spinal surgery was scheduled with Dr. Jordan Likes on Friday but is now postponed.  Pt. Is much better today and eager to start moving around and working on hip rehab. Reports N/V are completely resolved.    Hospital Course:   Post Op Ileus  -N/V have completely resolved this am  -Pt. Reported one episode of very loose stool yesterday and one more formed this morning  -CT on 10/6 with no acute findings or obstruction.  -NG was placed 10/3 >> d/c 10/5  -K was 3.7 on 10/5 repeleated yesterday with K; 4.5 on 10/7 labs  -Normoactive  BS are present in all for quadrants now  -Advance diet as tolerable    Right femur fracture w/ surgical repair:  -Dr. Carola Frost placed intramedullary nail to repair on 10/2  -Physical Therapy at SNF  -Follow up with Dr. Carola Frost.on approximately 10/16. -Rivaroxaban for post hip DVT prophylaxis.  Discontinue this medication on 03/23/2013   Spinal Stenosis: -Sees Neurosurgery outpatient - Dr. Jordan Likes  -Surgery was scheduled for 10/3-- postponed now  -Erie Noe (Dr. Lindalou Hose Nurse) was notified of current hospitalization and informed to cancel surgery until cleared by Ortho. [facility] [phone])    HTN  - BP is stable  - On Avapro and Amlodipine   Urinary retention / BPH  -Flomax  Increased to 0.8mg   -Had complaints of urinary retention  -Foley inserted by Urology after 3 unsuccessful attempts by nursing staff; good flow was reported after insertion.  -Discharged with foley in place.  -F/u with PCP/Urology post-op for foley removal and better medical control of obstructive symptoms   Delirium  -Ativan q4h prn was dosed once. Pt. did not tolerate well. DC'd  -Resolved; patient is back to baseline during exam on 10/7   Procedures:  Ortho Surgery-- see note    Discharge Exam: Filed Vitals:   02/20/13 1026  BP: 128/93  Pulse: 83  Temp: 99 F (37.2 C)  Resp: 18    General: AAO x 3, in NAD, resting comfortably in bed  Cardiovascular: RRR, no murmur, gallop, rubs, Normal S1/S2, Cap refill 2+ in extremities  Respiratory: CTAB, no w/r/r  Abdomen: soft, nontender,  no palpable masses, hypoactive BS in all four quadrants.  Musculoskeletal: Able to move all extremities w/o difficulty. 5/5 MMT with all ankle ROM. No calf tenderness, swelling, or erythema.  Neruo: Sensation intact in all extremities, no focal deficits  Psych: Back to baseline, able to carry on full conversation without difficulty.   Discharge Instructions      Discharge Orders   Future Orders Complete  By Expires   Diet general  As directed    Comments:     Low fiber until stomach is completely stabilized   Increase activity slowly  As directed    Weight bearing as tolerated  As directed        Medication List    STOP taking these medications       oxyCODONE-acetaminophen 10-325 MG per tablet  Commonly known as:  PERCOCET      TAKE these medications       acetaminophen 500 MG tablet  Commonly known as:  TYLENOL  Take 1,000 mg by mouth every 6 (six) hours as needed for pain.     acetaminophen 325 MG tablet  Commonly known as:  TYLENOL  Take 1-2 tablets (325-650 mg total) by mouth every 6 (six) hours as needed.     allopurinol 300 MG tablet  Commonly known as:  ZYLOPRIM  Take 300 mg by mouth every morning.     amLODipine 10 MG tablet  Commonly known as:  NORVASC  Take 10 mg by mouth every morning.     cyanocobalamin 500 MCG tablet  Take 500 mcg by mouth daily.     ferrous fumarate-iron polysaccharide complex 162-115.2 MG Caps  Commonly known as:  TANDEM  Take 1 capsule by mouth daily with breakfast.     gabapentin 600 MG tablet  Commonly known as:  NEURONTIN  Take 600 mg by mouth 2 (two) times daily.     HYDROcodone-acetaminophen 7.5-325 MG per tablet  Commonly known as:  NORCO  Take 1-2 tablets by mouth every 6 (six) hours as needed.     ibuprofen 800 MG tablet  Commonly known as:  ADVIL,MOTRIN  Take 1 tablet (800 mg total) by mouth every 6 (six) hours as needed.     irbesartan 300 MG tablet  Commonly known as:  AVAPRO  Take 300 mg by mouth every morning.     multivitamin with minerals Tabs tablet  Take 1 tablet by mouth daily.     ondansetron 4 MG tablet  Commonly known as:  ZOFRAN  Take 1 tablet (4 mg total) by mouth every 6 (six) hours as needed for nausea.     rivaroxaban 10 MG Tabs tablet  Commonly known as:  XARELTO  Take 1 tablet (10 mg total) by mouth daily.     tamsulosin 0.4 MG Caps capsule  Commonly known as:  FLOMAX  Take 2 capsules  (0.8 mg total) by mouth daily after breakfast.     zolpidem 10 MG tablet  Commonly known as:  AMBIEN  Take 1 tablet (10 mg total) by mouth at bedtime.       No Known Allergies Follow-up Information   Follow up with HANDY,MICHAEL H, MD. Schedule an appointment as soon as possible for a visit in 2 weeks. (call for appointment )    Specialty:  Orthopedic Surgery   Contact information:   [address] [place] [address] [phone]       Follow up with Desmond Dike, MD. Schedule an appointment  as soon as possible for a visit in 2 weeks.   Specialty:  Family Medicine   Contact information:   [address] [place] [address] [phone]       Follow up with Chelsea Aus, MD. Schedule an appointment as soon as possible for a visit in 1 week.   Specialty:  Urology   Contact information:   [address] [place] [address] [phone]       Follow up with POOL,HENRY A, MD. Schedule an appointment as soon as possible for a visit in 2 weeks.   Specialty:  Neurosurgery   Contact information:   [address] [place] [address] [phone]       The results of significant diagnostics from this hospitalization (including imaging, microbiology, ancillary and laboratory) are listed below for reference.    Significant Diagnostic Studies: Ct Abdomen Pelvis Wo Contrast  02/20/2013   *RADIOLOGY REPORT*  Clinical Data: Protracted vomiting; evaluate for bowel obstruction.  CT ABDOMEN AND PELVIS WITHOUT CONTRAST  Technique:  Multidetector CT imaging of the abdomen and pelvis was performed following the standard protocol without intravenous contrast.  Comparison: CT of the abdomen and pelvis performed 10/14/2011  Findings: Left basilar atelectasis is noted.  A 1.2 cm hypodensity within the right hepatic lobe has changed only minimally in size from the prior study.  The liver is otherwise unremarkable in  appearance.  The spleen is within normal limits. The pancreas and adrenal glands are unremarkable in appearance.  Nonspecific perinephric stranding is noted bilaterally.  There is bilateral renal atrophy, worse on the left.  A bilobed right renal cyst is noted, measuring approximately 7.0 x 6.0 cm.  There is no evidence of hydronephrosis.  No renal or ureteral stones are seen.  No free fluid is identified.  The small bowel is partially filled with contrast and is unremarkable in appearance.  The stomach is within normal limits.  No acute vascular abnormalities are seen. Scattered calcification is noted along the abdominal aorta and its branches.  The appendix is not seen.  There is no evidence for appendicitis. Scattered diverticulosis is noted along the descending and sigmoid colon, without evidence of diverticulitis.  Contrast progresses to the level of the rectum.  There is no evidence for bowel obstruction.  The bladder is decompressed, with a Foley catheter in place.  A small amount of air is noted within the bladder, reflecting Foley catheter placement.  The prostate remains normal in size, with minimal calcification.  No inguinal lymphadenopathy is seen.  No acute osseous abnormalities are identified.  An incompletely imaged right femoral screw is noted in expected position, with a relatively acute underlying right femoral neck fracture. Multilevel vacuum phenomenon is noted along the lumbar spine, with underlying facet disease.  IMPRESSION:  1.  No evidence of bowel obstruction.  Contrast has progressed to the level of the rectum.  The small bowel is unremarkable in appearance. 2.  Scattered diverticulosis along the descending and sigmoid colon, without evidence of diverticulitis. 3.  Bilateral renal atrophy, worse on the left; large bilobed right renal cyst again seen. 4.  Likely small hepatic cyst again noted. 5.  Left basilar atelectasis noted. 6.  Scattered calcification along the abdominal aorta and its  branches. 7.  Relatively acute right femoral neck fracture noted, with an associated right femoral screw in expected position. 8.  Mild degenerative change noted along the lumbar spine.   Original Report Authenticated By: Tinnie Gens  Cherly Hensen, M.D.   Dg Hip Operative Right  02/15/2013   CLINICAL DATA:  Fracture  EXAM: DG OPERATIVE RIGHT HIP  TECHNIQUE: A single spot fluoroscopic AP image of the right hip is submitted.  COMPARISON:  02/13/2013  FINDINGS: Intra medullary rod and dynamic compression screw transfix an intertrochanteric right femur fracture. 2 distal interlocking screws. Anatomic alignment.  IMPRESSION: ORIF femur fracture.   Electronically Signed   By: Maryclare Bean M.D.   On: 02/15/2013 15:14    Dg Pelvis Portable  02/15/2013   CLINICAL DATA:  79 year old male status post surgery. Initial encounter.  EXAM: PORTABLE PELVIS  COMPARISON:  02/13/2013.  FINDINGS: Portable views of the right hip and femur. Right femoral intra medullary rod with distal interlocking cortical screws and proximal interlocking dynamic hip screw have been placed traversing the intertrochanteric fracture. Improved alignment of fracture fragments. Hardware appears intact. Right femoral head normally located. No new fracture identified.  IMPRESSION: ORIF right femur with no adverse features.   Electronically Signed   By: Augusto Gamble M.D.   On: 02/15/2013 15:35    Dg Chest Port 1 View  02/18/2013   CLINICAL DATA:  Congestion. Pneumonia.  EXAM: PORTABLE CHEST - 1 VIEW  COMPARISON:  02/13/2013, [facility] hospital.  FINDINGS: Low volume chest. Basilar atelectasis. Low volumes accentuate the cardiopericardial silhouette. Retrocardiac density is present most compatible with atelectasis. There is no focal consolidation identified.  IMPRESSION: Low volume chest. No acute cardiopulmonary disease identified.   Electronically Signed   By: Andreas Newport M.D.   On: 02/18/2013 19:18   Dg Hip Portable 1 View Right  02/15/2013   CLINICAL DATA:   Postop.  EXAM: PORTABLE RIGHT HIP - 1 VIEW  COMPARISON:  02/15/2013 and 02/14/2013 as well as 02/13/2013  FINDINGS: A single portable cross-table lateral view of the right hip demonstrates an intramedullary rod with associated screw bridging the femoral neck into the femoral head as hardware appears to be intact and adequately positioned. Residual lucency is seen over patients trochanteric fracture.  IMPRESSION: Post internal fixation of patient's known right trochanteric fracture with hardware intact and normally positioned. Recommend correlation with findings at the time of the procedure.   Electronically Signed   By: Elberta Fortis M.D.   On: 02/15/2013 15:36   Dg Knee Complete 4 Views Left  01/30/2013   *RADIOLOGY REPORT*  Clinical Data: Left leg pain following a fall.  LEFT KNEE - COMPLETE 4+ VIEW  Comparison: 01/22/2013.  Findings: Anatomic alignment the left knee.  No fracture or hardware complication identified.  Uncomplicated left three-part total knee arthroplasty.  IMPRESSION: No acute abnormality.  Uncomplicated left three-part total knee arthroplasty.   Original Report Authenticated By: Andreas Newport, M.D.   Dg Knee Right Port  02/14/2013   CLINICAL DATA:  Knee pain. Hip fracture  EXAM: PORTABLE RIGHT KNEE - 1-2 VIEW  COMPARISON:  None.  FINDINGS: Moderate to advanced tricompartmental degenerative changes with joint space narrowing and spurring. Small joint effusion  Negative for fracture.  IMPRESSION: Moderate to advanced degenerative change. No acute abnormality.   Electronically Signed   By: Marlan Palau M.D.   On: 02/14/2013 16:49   Dg Abd 2 Views  02/18/2013   *RADIOLOGY REPORT*  Clinical Data: Ileus, subsequent encounter.  ABDOMEN - 2 VIEW  Comparison: 02/17/2013; 02/16/2013  Findings:  Persistent paucity of bowel gas.  Grossly unchanged moderate gaseous distension of a short segment of a solitary loop of small bowel within the right upper abdominal quadrant measuring  approximately 4.2  cm in diameter.  An enteric tube tip and side port project over the expected location of the gastric fundus.  No pneumoperitoneum, pneumatosis or portal venous gas.  Multilevel thoracolumbar spine degenerative change.  Post ORIF of the imaged right femur, incompletely evaluated.  IMPRESSION:  Persistent nonspecific moderate gas distension of a solitary loop of small bowel within the right upper abdominal quadrant.  There is an otherwise paucity of bowel gas which precludes evaluation for obstruction.   Original Report Authenticated By: Tacey Ruiz, MD       Microbiology: Recent Results (from the past 240 hour(s))  SURGICAL PCR SCREEN     Status: None   Collection Time    02/14/13  1:16 PM      Result Value Range Status   MRSA, PCR NEGATIVE  NEGATIVE Final   Staphylococcus aureus NEGATIVE  NEGATIVE Final   Comment:            The Xpert SA Assay (FDA     approved for NASAL specimens     in patients over 89 years of age),     is one component of     a comprehensive surveillance     program.  Test performance has     been validated by [employer] for patients greater     than or equal to 85 year old.     It is not intended     to diagnose infection nor to     guide or monitor treatment.     Labs: Basic Metabolic Panel:  Recent Labs Lab 02/15/13 0500 02/15/13 1555 02/16/13 0451 02/17/13 0640 02/18/13 0602 02/20/13 0645  NA 135  --  135 140 138 140  K 4.1  --  4.7 4.1 3.7 4.5  CL 98  --  101 104 101 105  CO2 25  --  25 24 25 25   GLUCOSE 103*  --  140* 98 97 103*  BUN 15  --  23 25* 23 22  CREATININE 1.33 1.46* 1.54* 1.33 1.14 1.24  CALCIUM 8.6  --  8.5 8.1* 8.5 8.7   CBC:  Recent Labs Lab 02/15/13 1555 02/16/13 0451 02/17/13 0640 02/18/13 0602 02/20/13 0645  WBC 16.5* 12.8* 8.4 10.1 9.5  HGB 12.7* 11.2* 10.0* 10.3* 10.9*  HCT 37.0* 32.7* 29.3* 30.4* 31.5*  MCV 90.9 90.3 90.7 90.5 89.2  PLT 143* 138* 150 177 218   CBG:  Recent Labs Lab 02/18/13 0057  02/18/13 1656  GLUCAP 95 91       Signed:  Stephani Police, PA-C Rob Bunting, PA-S2  [facility] 02/20/2013, 1:32 PM

## 2013-02-20 NOTE — Discharge Summary (Signed)
Addendum  Patient seen and examined, chart and data base reviewed.  I agree with the above assessment and plan.  For full details please see Mrs. Algis Downs PA note.  Presented with right hip fracture, developed post op ileus, now recovered well and tolerating regular diet.  Urinary retention status post Foley catheter, followup with Dr. Retta Diones.   Clint Lipps, MD [facility] Pager: [phone] 02/20/2013, 12:06 PM

## 2013-02-27 DIAGNOSIS — R339 Retention of urine, unspecified: Secondary | ICD-10-CM | POA: Diagnosis not present

## 2013-02-27 DIAGNOSIS — G4700 Insomnia, unspecified: Secondary | ICD-10-CM | POA: Diagnosis not present

## 2013-03-03 NOTE — Discharge Summary (Signed)
Addendum  Patient seen and examined, chart and data base reviewed.  I agree with the above assessment and plan.  For full details please see Mrs. Algis Downs PA note.  Presented with right hip fracture, developed post op ileus, now recovered well and tolerating regular diet.  Urinary retention status post Foley catheter, followup with Dr. Retta Diones.  Clint Lipps Pager: [phone] 03/03/2013, 8:30 AM

## 2013-03-05 DIAGNOSIS — S7223XA Displaced subtrochanteric fracture of unspecified femur, initial encounter for closed fracture: Secondary | ICD-10-CM | POA: Diagnosis not present

## 2013-03-13 DIAGNOSIS — R1084 Generalized abdominal pain: Secondary | ICD-10-CM | POA: Diagnosis not present

## 2013-03-13 DIAGNOSIS — N5089 Other specified disorders of the male genital organs: Secondary | ICD-10-CM | POA: Diagnosis not present

## 2013-03-13 DIAGNOSIS — N400 Enlarged prostate without lower urinary tract symptoms: Secondary | ICD-10-CM | POA: Diagnosis not present

## 2013-03-13 DIAGNOSIS — N390 Urinary tract infection, site not specified: Secondary | ICD-10-CM | POA: Diagnosis not present

## 2013-03-15 DIAGNOSIS — S72009A Fracture of unspecified part of neck of unspecified femur, initial encounter for closed fracture: Secondary | ICD-10-CM | POA: Diagnosis not present

## 2013-03-15 DIAGNOSIS — I951 Orthostatic hypotension: Secondary | ICD-10-CM | POA: Diagnosis not present

## 2013-03-15 DIAGNOSIS — R339 Retention of urine, unspecified: Secondary | ICD-10-CM | POA: Diagnosis not present

## 2013-03-19 DIAGNOSIS — N452 Orchitis: Secondary | ICD-10-CM | POA: Diagnosis not present

## 2013-03-19 DIAGNOSIS — N390 Urinary tract infection, site not specified: Secondary | ICD-10-CM | POA: Diagnosis not present

## 2013-03-20 DIAGNOSIS — R42 Dizziness and giddiness: Secondary | ICD-10-CM | POA: Diagnosis not present

## 2013-03-20 DIAGNOSIS — I959 Hypotension, unspecified: Secondary | ICD-10-CM | POA: Diagnosis not present

## 2013-03-26 DIAGNOSIS — S7223XA Displaced subtrochanteric fracture of unspecified femur, initial encounter for closed fracture: Secondary | ICD-10-CM | POA: Diagnosis not present

## 2013-03-29 DIAGNOSIS — M79609 Pain in unspecified limb: Secondary | ICD-10-CM | POA: Diagnosis not present

## 2013-03-29 DIAGNOSIS — I739 Peripheral vascular disease, unspecified: Secondary | ICD-10-CM | POA: Diagnosis not present

## 2013-03-29 DIAGNOSIS — B351 Tinea unguium: Secondary | ICD-10-CM | POA: Diagnosis not present

## 2013-04-05 DIAGNOSIS — N452 Orchitis: Secondary | ICD-10-CM | POA: Diagnosis not present

## 2013-04-05 DIAGNOSIS — N401 Enlarged prostate with lower urinary tract symptoms: Secondary | ICD-10-CM | POA: Diagnosis not present

## 2013-04-09 DIAGNOSIS — S7223XA Displaced subtrochanteric fracture of unspecified femur, initial encounter for closed fracture: Secondary | ICD-10-CM | POA: Diagnosis not present

## 2013-04-13 DIAGNOSIS — M6281 Muscle weakness (generalized): Secondary | ICD-10-CM | POA: Diagnosis not present

## 2013-04-13 DIAGNOSIS — N401 Enlarged prostate with lower urinary tract symptoms: Secondary | ICD-10-CM | POA: Diagnosis not present

## 2013-04-13 DIAGNOSIS — Z9181 History of falling: Secondary | ICD-10-CM | POA: Diagnosis not present

## 2013-04-13 DIAGNOSIS — I10 Essential (primary) hypertension: Secondary | ICD-10-CM | POA: Diagnosis not present

## 2013-04-13 DIAGNOSIS — F411 Generalized anxiety disorder: Secondary | ICD-10-CM | POA: Diagnosis not present

## 2013-04-13 DIAGNOSIS — G589 Mononeuropathy, unspecified: Secondary | ICD-10-CM | POA: Diagnosis not present

## 2013-04-13 DIAGNOSIS — R32 Unspecified urinary incontinence: Secondary | ICD-10-CM | POA: Diagnosis not present

## 2013-04-13 DIAGNOSIS — M5430 Sciatica, unspecified side: Secondary | ICD-10-CM | POA: Diagnosis not present

## 2013-04-13 DIAGNOSIS — M109 Gout, unspecified: Secondary | ICD-10-CM | POA: Diagnosis not present

## 2013-04-13 DIAGNOSIS — F329 Major depressive disorder, single episode, unspecified: Secondary | ICD-10-CM | POA: Diagnosis not present

## 2013-04-13 DIAGNOSIS — M48061 Spinal stenosis, lumbar region without neurogenic claudication: Secondary | ICD-10-CM | POA: Diagnosis not present

## 2013-04-13 DIAGNOSIS — N453 Epididymo-orchitis: Secondary | ICD-10-CM | POA: Diagnosis not present

## 2013-04-13 DIAGNOSIS — S72009D Fracture of unspecified part of neck of unspecified femur, subsequent encounter for closed fracture with routine healing: Secondary | ICD-10-CM | POA: Diagnosis not present

## 2013-04-13 DIAGNOSIS — Z792 Long term (current) use of antibiotics: Secondary | ICD-10-CM | POA: Diagnosis not present

## 2013-04-16 DIAGNOSIS — M6281 Muscle weakness (generalized): Secondary | ICD-10-CM | POA: Diagnosis not present

## 2013-04-16 DIAGNOSIS — M5430 Sciatica, unspecified side: Secondary | ICD-10-CM | POA: Diagnosis not present

## 2013-04-16 DIAGNOSIS — M48061 Spinal stenosis, lumbar region without neurogenic claudication: Secondary | ICD-10-CM | POA: Diagnosis not present

## 2013-04-16 DIAGNOSIS — S72009D Fracture of unspecified part of neck of unspecified femur, subsequent encounter for closed fracture with routine healing: Secondary | ICD-10-CM | POA: Diagnosis not present

## 2013-04-16 DIAGNOSIS — I10 Essential (primary) hypertension: Secondary | ICD-10-CM | POA: Diagnosis not present

## 2013-04-16 DIAGNOSIS — G589 Mononeuropathy, unspecified: Secondary | ICD-10-CM | POA: Diagnosis not present

## 2013-04-17 DIAGNOSIS — M6281 Muscle weakness (generalized): Secondary | ICD-10-CM | POA: Diagnosis not present

## 2013-04-17 DIAGNOSIS — M25569 Pain in unspecified knee: Secondary | ICD-10-CM | POA: Diagnosis not present

## 2013-04-17 DIAGNOSIS — G589 Mononeuropathy, unspecified: Secondary | ICD-10-CM | POA: Diagnosis not present

## 2013-04-17 DIAGNOSIS — I10 Essential (primary) hypertension: Secondary | ICD-10-CM | POA: Diagnosis not present

## 2013-04-17 DIAGNOSIS — M48061 Spinal stenosis, lumbar region without neurogenic claudication: Secondary | ICD-10-CM | POA: Diagnosis not present

## 2013-04-17 DIAGNOSIS — M549 Dorsalgia, unspecified: Secondary | ICD-10-CM | POA: Diagnosis not present

## 2013-04-17 DIAGNOSIS — S72009D Fracture of unspecified part of neck of unspecified femur, subsequent encounter for closed fracture with routine healing: Secondary | ICD-10-CM | POA: Diagnosis not present

## 2013-04-17 DIAGNOSIS — M5430 Sciatica, unspecified side: Secondary | ICD-10-CM | POA: Diagnosis not present

## 2013-04-18 DIAGNOSIS — M5430 Sciatica, unspecified side: Secondary | ICD-10-CM | POA: Diagnosis not present

## 2013-04-18 DIAGNOSIS — S72009D Fracture of unspecified part of neck of unspecified femur, subsequent encounter for closed fracture with routine healing: Secondary | ICD-10-CM | POA: Diagnosis not present

## 2013-04-18 DIAGNOSIS — G589 Mononeuropathy, unspecified: Secondary | ICD-10-CM | POA: Diagnosis not present

## 2013-04-18 DIAGNOSIS — M48061 Spinal stenosis, lumbar region without neurogenic claudication: Secondary | ICD-10-CM | POA: Diagnosis not present

## 2013-04-18 DIAGNOSIS — I10 Essential (primary) hypertension: Secondary | ICD-10-CM | POA: Diagnosis not present

## 2013-04-18 DIAGNOSIS — M6281 Muscle weakness (generalized): Secondary | ICD-10-CM | POA: Diagnosis not present

## 2013-04-20 DIAGNOSIS — S72009D Fracture of unspecified part of neck of unspecified femur, subsequent encounter for closed fracture with routine healing: Secondary | ICD-10-CM | POA: Diagnosis not present

## 2013-04-20 DIAGNOSIS — M5430 Sciatica, unspecified side: Secondary | ICD-10-CM | POA: Diagnosis not present

## 2013-04-20 DIAGNOSIS — M48061 Spinal stenosis, lumbar region without neurogenic claudication: Secondary | ICD-10-CM | POA: Diagnosis not present

## 2013-04-20 DIAGNOSIS — G589 Mononeuropathy, unspecified: Secondary | ICD-10-CM | POA: Diagnosis not present

## 2013-04-20 DIAGNOSIS — M6281 Muscle weakness (generalized): Secondary | ICD-10-CM | POA: Diagnosis not present

## 2013-04-20 DIAGNOSIS — I10 Essential (primary) hypertension: Secondary | ICD-10-CM | POA: Diagnosis not present

## 2013-04-24 DIAGNOSIS — S72009D Fracture of unspecified part of neck of unspecified femur, subsequent encounter for closed fracture with routine healing: Secondary | ICD-10-CM | POA: Diagnosis not present

## 2013-04-24 DIAGNOSIS — G589 Mononeuropathy, unspecified: Secondary | ICD-10-CM | POA: Diagnosis not present

## 2013-04-24 DIAGNOSIS — M48061 Spinal stenosis, lumbar region without neurogenic claudication: Secondary | ICD-10-CM | POA: Diagnosis not present

## 2013-04-24 DIAGNOSIS — M6281 Muscle weakness (generalized): Secondary | ICD-10-CM | POA: Diagnosis not present

## 2013-04-24 DIAGNOSIS — I10 Essential (primary) hypertension: Secondary | ICD-10-CM | POA: Diagnosis not present

## 2013-04-24 DIAGNOSIS — M5430 Sciatica, unspecified side: Secondary | ICD-10-CM | POA: Diagnosis not present

## 2013-04-25 DIAGNOSIS — G589 Mononeuropathy, unspecified: Secondary | ICD-10-CM | POA: Diagnosis not present

## 2013-04-25 DIAGNOSIS — I10 Essential (primary) hypertension: Secondary | ICD-10-CM | POA: Diagnosis not present

## 2013-04-25 DIAGNOSIS — M6281 Muscle weakness (generalized): Secondary | ICD-10-CM | POA: Diagnosis not present

## 2013-04-25 DIAGNOSIS — M5430 Sciatica, unspecified side: Secondary | ICD-10-CM | POA: Diagnosis not present

## 2013-04-25 DIAGNOSIS — M48061 Spinal stenosis, lumbar region without neurogenic claudication: Secondary | ICD-10-CM | POA: Diagnosis not present

## 2013-04-25 DIAGNOSIS — S72009D Fracture of unspecified part of neck of unspecified femur, subsequent encounter for closed fracture with routine healing: Secondary | ICD-10-CM | POA: Diagnosis not present

## 2013-04-26 DIAGNOSIS — I10 Essential (primary) hypertension: Secondary | ICD-10-CM | POA: Diagnosis not present

## 2013-04-26 DIAGNOSIS — H2510 Age-related nuclear cataract, unspecified eye: Secondary | ICD-10-CM | POA: Diagnosis not present

## 2013-04-26 DIAGNOSIS — N139 Obstructive and reflux uropathy, unspecified: Secondary | ICD-10-CM | POA: Diagnosis not present

## 2013-04-26 DIAGNOSIS — N401 Enlarged prostate with lower urinary tract symptoms: Secondary | ICD-10-CM | POA: Diagnosis not present

## 2013-04-27 DIAGNOSIS — G589 Mononeuropathy, unspecified: Secondary | ICD-10-CM | POA: Diagnosis not present

## 2013-04-27 DIAGNOSIS — M5430 Sciatica, unspecified side: Secondary | ICD-10-CM | POA: Diagnosis not present

## 2013-04-27 DIAGNOSIS — S72009D Fracture of unspecified part of neck of unspecified femur, subsequent encounter for closed fracture with routine healing: Secondary | ICD-10-CM | POA: Diagnosis not present

## 2013-04-27 DIAGNOSIS — M6281 Muscle weakness (generalized): Secondary | ICD-10-CM | POA: Diagnosis not present

## 2013-04-27 DIAGNOSIS — I10 Essential (primary) hypertension: Secondary | ICD-10-CM | POA: Diagnosis not present

## 2013-04-27 DIAGNOSIS — M48061 Spinal stenosis, lumbar region without neurogenic claudication: Secondary | ICD-10-CM | POA: Diagnosis not present

## 2013-04-30 DIAGNOSIS — I10 Essential (primary) hypertension: Secondary | ICD-10-CM | POA: Diagnosis not present

## 2013-04-30 DIAGNOSIS — M48061 Spinal stenosis, lumbar region without neurogenic claudication: Secondary | ICD-10-CM | POA: Diagnosis not present

## 2013-04-30 DIAGNOSIS — M6281 Muscle weakness (generalized): Secondary | ICD-10-CM | POA: Diagnosis not present

## 2013-04-30 DIAGNOSIS — S72009D Fracture of unspecified part of neck of unspecified femur, subsequent encounter for closed fracture with routine healing: Secondary | ICD-10-CM | POA: Diagnosis not present

## 2013-04-30 DIAGNOSIS — G589 Mononeuropathy, unspecified: Secondary | ICD-10-CM | POA: Diagnosis not present

## 2013-04-30 DIAGNOSIS — M5430 Sciatica, unspecified side: Secondary | ICD-10-CM | POA: Diagnosis not present

## 2013-05-01 DIAGNOSIS — M6281 Muscle weakness (generalized): Secondary | ICD-10-CM | POA: Diagnosis not present

## 2013-05-01 DIAGNOSIS — G589 Mononeuropathy, unspecified: Secondary | ICD-10-CM | POA: Diagnosis not present

## 2013-05-01 DIAGNOSIS — M48061 Spinal stenosis, lumbar region without neurogenic claudication: Secondary | ICD-10-CM | POA: Diagnosis not present

## 2013-05-03 DIAGNOSIS — M5430 Sciatica, unspecified side: Secondary | ICD-10-CM | POA: Diagnosis not present

## 2013-05-03 DIAGNOSIS — G589 Mononeuropathy, unspecified: Secondary | ICD-10-CM | POA: Diagnosis not present

## 2013-05-03 DIAGNOSIS — L570 Actinic keratosis: Secondary | ICD-10-CM | POA: Diagnosis not present

## 2013-05-03 DIAGNOSIS — C44211 Basal cell carcinoma of skin of unspecified ear and external auricular canal: Secondary | ICD-10-CM | POA: Diagnosis not present

## 2013-05-03 DIAGNOSIS — M48061 Spinal stenosis, lumbar region without neurogenic claudication: Secondary | ICD-10-CM | POA: Diagnosis not present

## 2013-05-03 DIAGNOSIS — M6281 Muscle weakness (generalized): Secondary | ICD-10-CM | POA: Diagnosis not present

## 2013-05-03 DIAGNOSIS — S72009D Fracture of unspecified part of neck of unspecified femur, subsequent encounter for closed fracture with routine healing: Secondary | ICD-10-CM | POA: Diagnosis not present

## 2013-05-03 DIAGNOSIS — I10 Essential (primary) hypertension: Secondary | ICD-10-CM | POA: Diagnosis not present

## 2013-05-03 DIAGNOSIS — L821 Other seborrheic keratosis: Secondary | ICD-10-CM | POA: Diagnosis not present

## 2013-05-07 DIAGNOSIS — M1710 Unilateral primary osteoarthritis, unspecified knee: Secondary | ICD-10-CM | POA: Diagnosis not present

## 2013-05-07 DIAGNOSIS — M25569 Pain in unspecified knee: Secondary | ICD-10-CM | POA: Diagnosis not present

## 2013-05-08 DIAGNOSIS — M6281 Muscle weakness (generalized): Secondary | ICD-10-CM | POA: Diagnosis not present

## 2013-05-08 DIAGNOSIS — M5430 Sciatica, unspecified side: Secondary | ICD-10-CM | POA: Diagnosis not present

## 2013-05-08 DIAGNOSIS — I10 Essential (primary) hypertension: Secondary | ICD-10-CM | POA: Diagnosis not present

## 2013-05-08 DIAGNOSIS — G589 Mononeuropathy, unspecified: Secondary | ICD-10-CM | POA: Diagnosis not present

## 2013-05-08 DIAGNOSIS — M48061 Spinal stenosis, lumbar region without neurogenic claudication: Secondary | ICD-10-CM | POA: Diagnosis not present

## 2013-05-08 DIAGNOSIS — S72009D Fracture of unspecified part of neck of unspecified femur, subsequent encounter for closed fracture with routine healing: Secondary | ICD-10-CM | POA: Diagnosis not present

## 2013-05-09 DIAGNOSIS — M5430 Sciatica, unspecified side: Secondary | ICD-10-CM | POA: Diagnosis not present

## 2013-05-09 DIAGNOSIS — G589 Mononeuropathy, unspecified: Secondary | ICD-10-CM | POA: Diagnosis not present

## 2013-05-09 DIAGNOSIS — M48061 Spinal stenosis, lumbar region without neurogenic claudication: Secondary | ICD-10-CM | POA: Diagnosis not present

## 2013-05-09 DIAGNOSIS — S72009D Fracture of unspecified part of neck of unspecified femur, subsequent encounter for closed fracture with routine healing: Secondary | ICD-10-CM | POA: Diagnosis not present

## 2013-05-09 DIAGNOSIS — M6281 Muscle weakness (generalized): Secondary | ICD-10-CM | POA: Diagnosis not present

## 2013-05-09 DIAGNOSIS — I10 Essential (primary) hypertension: Secondary | ICD-10-CM | POA: Diagnosis not present

## 2013-05-11 DIAGNOSIS — G589 Mononeuropathy, unspecified: Secondary | ICD-10-CM | POA: Diagnosis not present

## 2013-05-11 DIAGNOSIS — M5430 Sciatica, unspecified side: Secondary | ICD-10-CM | POA: Diagnosis not present

## 2013-05-11 DIAGNOSIS — M48061 Spinal stenosis, lumbar region without neurogenic claudication: Secondary | ICD-10-CM | POA: Diagnosis not present

## 2013-05-11 DIAGNOSIS — I10 Essential (primary) hypertension: Secondary | ICD-10-CM | POA: Diagnosis not present

## 2013-05-11 DIAGNOSIS — M6281 Muscle weakness (generalized): Secondary | ICD-10-CM | POA: Diagnosis not present

## 2013-05-11 DIAGNOSIS — J209 Acute bronchitis, unspecified: Secondary | ICD-10-CM | POA: Diagnosis not present

## 2013-05-11 DIAGNOSIS — S72009D Fracture of unspecified part of neck of unspecified femur, subsequent encounter for closed fracture with routine healing: Secondary | ICD-10-CM | POA: Diagnosis not present

## 2013-05-16 DIAGNOSIS — N401 Enlarged prostate with lower urinary tract symptoms: Secondary | ICD-10-CM | POA: Diagnosis not present

## 2013-05-16 DIAGNOSIS — N529 Male erectile dysfunction, unspecified: Secondary | ICD-10-CM | POA: Diagnosis not present

## 2013-05-16 DIAGNOSIS — M48061 Spinal stenosis, lumbar region without neurogenic claudication: Secondary | ICD-10-CM | POA: Diagnosis not present

## 2013-05-16 DIAGNOSIS — M6281 Muscle weakness (generalized): Secondary | ICD-10-CM | POA: Diagnosis not present

## 2013-05-16 DIAGNOSIS — G589 Mononeuropathy, unspecified: Secondary | ICD-10-CM | POA: Diagnosis not present

## 2013-05-21 DIAGNOSIS — IMO0001 Reserved for inherently not codable concepts without codable children: Secondary | ICD-10-CM | POA: Diagnosis not present

## 2013-05-21 DIAGNOSIS — S7223XA Displaced subtrochanteric fracture of unspecified femur, initial encounter for closed fracture: Secondary | ICD-10-CM | POA: Diagnosis not present

## 2013-05-23 ENCOUNTER — Other Ambulatory Visit: Payer: Self-pay | Admitting: Neurosurgery

## 2013-05-25 ENCOUNTER — Encounter (HOSPITAL_COMMUNITY): Payer: Self-pay | Admitting: Pharmacy Technician

## 2013-05-28 DIAGNOSIS — M48061 Spinal stenosis, lumbar region without neurogenic claudication: Secondary | ICD-10-CM | POA: Diagnosis not present

## 2013-05-28 DIAGNOSIS — G589 Mononeuropathy, unspecified: Secondary | ICD-10-CM | POA: Diagnosis not present

## 2013-05-28 DIAGNOSIS — M6281 Muscle weakness (generalized): Secondary | ICD-10-CM | POA: Diagnosis not present

## 2013-05-29 ENCOUNTER — Other Ambulatory Visit (HOSPITAL_COMMUNITY): Payer: Self-pay | Admitting: *Deleted

## 2013-05-29 NOTE — Pre-Procedure Instructions (Signed)
Tagen Milby  05/29/2013   Your procedure is scheduled on:  Friday, June 01, 2013 at 10:00 AM.   Report to [facility] Entrance "A" Admitting Office at 8:00 AM.   Call this number if you have problems the morning of surgery: [phone]   Remember:   Do not eat food or drink liquids after midnight Thursday, 05/31/13.   Take these medicines the morning of surgery with A SIP OF WATER: amLODipine (NORVASC), gabapentin (NEURONTIN),  tamsulosin (FLOMAX),  acetaminophen (TYLENOL) - if needed.     Do not wear jewelry.  Do not wear lotions, powders, or cologne. You may wear deodorant.  Men may shave face and neck.  Do not bring valuables to the hospital.  [facility] is not responsible                  for any belongings or valuables.               Contacts, dentures or bridgework may not be worn into surgery.  Leave suitcase in the car. After surgery it may be brought to your room.  For patients admitted to the hospital, discharge time is determined by your                treatment team.                 Special Instructions: Shower using CHG 2 nights before surgery and the night before surgery.  If you shower the day of surgery use CHG.  Use special wash - you have one bottle of CHG for all showers.  You should use approximately 1/3 of the bottle for each shower.   Please read over the following fact sheets that you were given: Pain Booklet, Coughing and Deep Breathing, MRSA Information and Surgical Site Infection Prevention

## 2013-05-30 ENCOUNTER — Encounter (HOSPITAL_COMMUNITY): Payer: Self-pay

## 2013-05-30 ENCOUNTER — Encounter (HOSPITAL_COMMUNITY)
Admission: RE | Admit: 2013-05-30 | Discharge: 2013-05-30 | Disposition: A | Discharge status: Home / Self Care | Payer: Medicare Other | Source: Ambulatory Visit | Attending: Anesthesiology | Admitting: Anesthesiology

## 2013-05-30 ENCOUNTER — Encounter (HOSPITAL_COMMUNITY)
Admission: RE | Admit: 2013-05-30 | Discharge: 2013-05-30 | Disposition: A | Discharge status: Home / Self Care | Payer: Medicare Other | Source: Ambulatory Visit | Attending: Neurosurgery | Admitting: Neurosurgery

## 2013-05-30 DIAGNOSIS — Z01818 Encounter for other preprocedural examination: Secondary | ICD-10-CM | POA: Diagnosis not present

## 2013-05-30 HISTORY — DX: Gout, unspecified: M10.9

## 2013-05-30 HISTORY — DX: Anemia, unspecified: D64.9

## 2013-05-30 HISTORY — DX: Polyneuropathy, unspecified: G62.9

## 2013-05-30 HISTORY — DX: Unspecified hemorrhoids: K64.9

## 2013-05-30 HISTORY — DX: Personal history of colon polyps, unspecified: Z86.0100

## 2013-05-30 HISTORY — DX: Insomnia, unspecified: G47.00

## 2013-05-30 HISTORY — DX: Personal history of urinary (tract) infections: Z87.440

## 2013-05-30 HISTORY — DX: Reserved for inherently not codable concepts without codable children: IMO0001

## 2013-05-30 HISTORY — DX: Personal history of colonic polyps: Z86.010

## 2013-05-30 HISTORY — DX: Unspecified hearing loss, unspecified ear: H91.90

## 2013-05-30 HISTORY — DX: Unspecified cataract: H26.9

## 2013-05-30 HISTORY — DX: Personal history of other diseases of the respiratory system: Z87.09

## 2013-05-30 HISTORY — DX: Personal history of other medical treatment: Z92.89

## 2013-05-30 HISTORY — DX: Pain in unspecified joint: M25.50

## 2013-05-30 LAB — CBC WITH DIFFERENTIAL/PLATELET
BASOS ABS: 0 10*3/uL (ref 0.0–0.1)
Basophils Relative: 0 % (ref 0–1)
Eosinophils Absolute: 0.1 10*3/uL (ref 0.0–0.7)
Eosinophils Relative: 1 % (ref 0–5)
HEMATOCRIT: 36.4 % — AB (ref 39.0–52.0)
HEMOGLOBIN: 12.3 g/dL — AB (ref 13.0–17.0)
LYMPHS PCT: 15 % (ref 12–46)
Lymphs Abs: 1.3 10*3/uL (ref 0.7–4.0)
MCH: 30.6 pg (ref 26.0–34.0)
MCHC: 33.8 g/dL (ref 30.0–36.0)
MCV: 90.5 fL (ref 78.0–100.0)
MONO ABS: 0.6 10*3/uL (ref 0.1–1.0)
MONOS PCT: 8 % (ref 3–12)
Neutro Abs: 6.5 10*3/uL (ref 1.7–7.7)
Neutrophils Relative %: 76 % (ref 43–77)
Platelets: 179 10*3/uL (ref 150–400)
RBC: 4.02 MIL/uL — ABNORMAL LOW (ref 4.22–5.81)
RDW: 14.9 % (ref 11.5–15.5)
WBC: 8.5 10*3/uL (ref 4.0–10.5)

## 2013-05-30 LAB — BASIC METABOLIC PANEL
BUN: 14 mg/dL (ref 6–23)
CHLORIDE: 101 meq/L (ref 96–112)
CO2: 25 mEq/L (ref 19–32)
Calcium: 9 mg/dL (ref 8.4–10.5)
Creatinine, Ser: 1.54 mg/dL — ABNORMAL HIGH (ref 0.50–1.35)
GFR calc non Af Amer: 41 mL/min — ABNORMAL LOW (ref >90)
GFR, EST AFRICAN AMERICAN: 48 mL/min — AB (ref >90)
Glucose, Bld: 118 mg/dL — ABNORMAL HIGH (ref 70–99)
POTASSIUM: 4.2 meq/L (ref 3.7–5.3)
Sodium: 138 mEq/L (ref 137–147)

## 2013-05-30 LAB — SURGICAL PCR SCREEN
MRSA, PCR: NEGATIVE
STAPHYLOCOCCUS AUREUS: NEGATIVE

## 2013-05-30 NOTE — Pre-Procedure Instructions (Signed)
[name]  05/30/2013   Your procedure is scheduled on:  Fri, Jan 16 @ 10:00 AM  Report to [facility] En[facility]ance A at 8:00 AM.  Call this number if you have problems the morning of surgery: [phone]   Remember:   Do not eat food or drink liquids after midnight.   Take these medicines the morning of surgery with A SIP OF WATER: Allopurinol(Zyloprim),Amlodipine(Norvasc),Gabapentin(Neurontin),Trimethoprim(Trimpex), and Flomax(Tamsulosin)   Do not wear jewelry.  Do not wear lotions, powders, or colognes. You may wear deodorant.  Men may shave face and neck.  Do not bring valuables to the hospital.  [facility] is not responsible                  for any belongings or valuables.               Contacts, dentures or bridgework may not be worn into surgery.  Leave suitcase in the car. After surgery it may be brought to your room.  For patients admitted to the hospital, discharge time is determined by your                treatment team.               Patients discharged the day of surgery will not be allowed to drive  home.    Special Instructions: Shower using CHG 2 nights before surgery and the night before surgery.  If you shower the day of surgery use CHG.  Use special wash - you have one bottle of CHG for all showers.  You should use approximately 1/3 of the bottle for each shower.   Please read over the following fact sheets that you were given: Pain Booklet, Coughing and Deep Breathing, MRSA Information and Surgical Site Infection Prevention

## 2013-05-30 NOTE — Progress Notes (Addendum)
  Pt doesn't have a cardiologist  Stress test done at [facility] in [place] with Dr.Vyas  Denies ever having an echo or heart cath   Medical Md is Dr.John Lysbeth Galas  EKG in epic from 02-14-13  Denies CXR in past yr

## 2013-05-31 DIAGNOSIS — B351 Tinea unguium: Secondary | ICD-10-CM | POA: Diagnosis not present

## 2013-05-31 DIAGNOSIS — M79609 Pain in unspecified limb: Secondary | ICD-10-CM | POA: Diagnosis not present

## 2013-05-31 MED ORDER — CEFAZOLIN SODIUM-DEXTROSE 2-3 GM-% IV SOLR
2.0000 g | INTRAVENOUS | Status: AC
  Administered 2013-06-01: 2 g via INTRAVENOUS
  Filled 2013-05-31: qty 50

## 2013-05-31 MED ORDER — DEXAMETHASONE SODIUM PHOSPHATE 10 MG/ML IJ SOLN
10.0000 mg | INTRAMUSCULAR | Status: AC
  Administered 2013-06-01: 10 mg via INTRAVENOUS
  Filled 2013-05-31: qty 1

## 2013-06-01 ENCOUNTER — Encounter (HOSPITAL_COMMUNITY): Payer: Medicare Other | Admitting: Certified Registered Nurse Anesthetist

## 2013-06-01 ENCOUNTER — Encounter (HOSPITAL_COMMUNITY): Payer: Self-pay | Admitting: Certified Registered Nurse Anesthetist

## 2013-06-01 ENCOUNTER — Encounter (HOSPITAL_COMMUNITY)
Admission: RE | Disposition: A | Discharge status: Home / Self Care | Payer: Self-pay | Source: Ambulatory Visit | Attending: Neurosurgery

## 2013-06-01 ENCOUNTER — Inpatient Hospital Stay (HOSPITAL_COMMUNITY): Payer: Medicare Other

## 2013-06-01 ENCOUNTER — Inpatient Hospital Stay (HOSPITAL_COMMUNITY)
Admission: RE | Admit: 2013-06-01 | Discharge: 2013-06-02 | DRG: 520 | Disposition: A | Discharge status: Home / Self Care | Payer: Medicare Other | Source: Ambulatory Visit | Attending: Neurosurgery | Admitting: Neurosurgery

## 2013-06-01 ENCOUNTER — Inpatient Hospital Stay (HOSPITAL_COMMUNITY): Payer: Medicare Other | Admitting: Certified Registered Nurse Anesthetist

## 2013-06-01 DIAGNOSIS — M48062 Spinal stenosis, lumbar region with neurogenic claudication: Secondary | ICD-10-CM | POA: Diagnosis not present

## 2013-06-01 DIAGNOSIS — F329 Major depressive disorder, single episode, unspecified: Secondary | ICD-10-CM | POA: Diagnosis present

## 2013-06-01 DIAGNOSIS — N400 Enlarged prostate without lower urinary tract symptoms: Secondary | ICD-10-CM | POA: Diagnosis present

## 2013-06-01 DIAGNOSIS — D649 Anemia, unspecified: Secondary | ICD-10-CM | POA: Diagnosis not present

## 2013-06-01 DIAGNOSIS — Z85828 Personal history of other malignant neoplasm of skin: Secondary | ICD-10-CM

## 2013-06-01 DIAGNOSIS — Z9089 Acquired absence of other organs: Secondary | ICD-10-CM

## 2013-06-01 DIAGNOSIS — G4700 Insomnia, unspecified: Secondary | ICD-10-CM | POA: Diagnosis present

## 2013-06-01 DIAGNOSIS — Z860100 Personal history of colon polyps, unspecified: Secondary | ICD-10-CM

## 2013-06-01 DIAGNOSIS — H9190 Unspecified hearing loss, unspecified ear: Secondary | ICD-10-CM | POA: Diagnosis present

## 2013-06-01 DIAGNOSIS — Z87442 Personal history of urinary calculi: Secondary | ICD-10-CM

## 2013-06-01 DIAGNOSIS — G609 Hereditary and idiopathic neuropathy, unspecified: Secondary | ICD-10-CM | POA: Diagnosis present

## 2013-06-01 DIAGNOSIS — Z79899 Other long term (current) drug therapy: Secondary | ICD-10-CM

## 2013-06-01 DIAGNOSIS — I10 Essential (primary) hypertension: Secondary | ICD-10-CM | POA: Diagnosis not present

## 2013-06-01 DIAGNOSIS — M539 Dorsopathy, unspecified: Secondary | ICD-10-CM | POA: Diagnosis not present

## 2013-06-01 DIAGNOSIS — M5126 Other intervertebral disc displacement, lumbar region: Secondary | ICD-10-CM | POA: Diagnosis not present

## 2013-06-01 DIAGNOSIS — Z87440 Personal history of urinary (tract) infections: Secondary | ICD-10-CM

## 2013-06-01 DIAGNOSIS — F3289 Other specified depressive episodes: Secondary | ICD-10-CM | POA: Diagnosis present

## 2013-06-01 DIAGNOSIS — Z01812 Encounter for preprocedural laboratory examination: Secondary | ICD-10-CM

## 2013-06-01 DIAGNOSIS — M47817 Spondylosis without myelopathy or radiculopathy, lumbosacral region: Secondary | ICD-10-CM | POA: Diagnosis not present

## 2013-06-01 DIAGNOSIS — M109 Gout, unspecified: Secondary | ICD-10-CM | POA: Diagnosis present

## 2013-06-01 DIAGNOSIS — H269 Unspecified cataract: Secondary | ICD-10-CM | POA: Diagnosis present

## 2013-06-01 HISTORY — PX: LUMBAR LAMINECTOMY/DECOMPRESSION MICRODISCECTOMY: SHX5026

## 2013-06-01 HISTORY — DX: Spinal stenosis, lumbar region with neurogenic claudication: M48.062

## 2013-06-01 SURGERY — LUMBAR LAMINECTOMY/DECOMPRESSION MICRODISCECTOMY 1 LEVEL
Anesthesia: General | Site: Spine Lumbar | Laterality: Left

## 2013-06-01 MED ORDER — OXYCODONE HCL 5 MG PO TABS: 5.0000 mg | ORAL_TABLET | Freq: Once | ORAL | Status: DC | PRN

## 2013-06-01 MED ORDER — PROPOFOL 10 MG/ML IV BOLUS
INTRAVENOUS | Status: DC | PRN
  Administered 2013-06-01: 200 mg via INTRAVENOUS

## 2013-06-01 MED ORDER — GABAPENTIN 600 MG PO TABS
600.0000 mg | ORAL_TABLET | Freq: Two times a day (BID) | ORAL | Status: DC
  Administered 2013-06-01: 600 mg via ORAL
  Filled 2013-06-01 (×3): qty 1

## 2013-06-01 MED ORDER — CYCLOBENZAPRINE HCL 10 MG PO TABS: 10.0000 mg | ORAL_TABLET | Freq: Three times a day (TID) | ORAL | Status: DC | PRN

## 2013-06-01 MED ORDER — ACETAMINOPHEN 325 MG PO TABS
650.0000 mg | ORAL_TABLET | ORAL | Status: DC | PRN
Start: 2013-06-01 — End: 2013-06-02

## 2013-06-01 MED ORDER — ONDANSETRON HCL 4 MG/2ML IJ SOLN: 4.0000 mg | Freq: Once | INTRAMUSCULAR | Status: DC | PRN

## 2013-06-01 MED ORDER — MENTHOL 3 MG MT LOZG: 1.0000 | LOZENGE | OROMUCOSAL | Status: DC | PRN

## 2013-06-01 MED ORDER — ESMOLOL HCL 10 MG/ML IV SOLN
INTRAVENOUS | Status: DC | PRN
  Administered 2013-06-01: 20 mg via INTRAVENOUS

## 2013-06-01 MED ORDER — ONDANSETRON HCL 4 MG/2ML IJ SOLN
INTRAMUSCULAR | Status: DC | PRN
  Administered 2013-06-01: 4 mg via INTRAVENOUS

## 2013-06-01 MED ORDER — ADULT MULTIVITAMIN W/MINERALS CH
1.0000 | ORAL_TABLET | Freq: Every day | ORAL | Status: DC
  Filled 2013-06-01: qty 1

## 2013-06-01 MED ORDER — CEFAZOLIN SODIUM 1-5 GM-% IV SOLN
1.0000 g | Freq: Three times a day (TID) | INTRAVENOUS | Status: AC
  Administered 2013-06-01 (×2): 1 g via INTRAVENOUS
  Filled 2013-06-01 (×2): qty 50

## 2013-06-01 MED ORDER — TAMSULOSIN HCL 0.4 MG PO CAPS
0.4000 mg | ORAL_CAPSULE | Freq: Every day | ORAL | Status: DC
  Filled 2013-06-01: qty 1

## 2013-06-01 MED ORDER — KETOROLAC TROMETHAMINE 30 MG/ML IJ SOLN
INTRAMUSCULAR | Status: DC | PRN
  Administered 2013-06-01: 15 mg via INTRAVENOUS

## 2013-06-01 MED ORDER — ACETAMINOPHEN 650 MG RE SUPP: 650.0000 mg | RECTAL | Status: DC | PRN

## 2013-06-01 MED ORDER — HYDROMORPHONE HCL PF 1 MG/ML IJ SOLN: 0.2500 mg | INTRAMUSCULAR | Status: DC | PRN

## 2013-06-01 MED ORDER — ALUM & MAG HYDROXIDE-SIMETH 200-200-20 MG/5ML PO SUSP: 30.0000 mL | Freq: Four times a day (QID) | ORAL | Status: DC | PRN

## 2013-06-01 MED ORDER — GLYCOPYRROLATE 0.2 MG/ML IJ SOLN
INTRAMUSCULAR | Status: DC | PRN
  Administered 2013-06-01: .8 mg via INTRAVENOUS

## 2013-06-01 MED ORDER — ALLOPURINOL 300 MG PO TABS
300.0000 mg | ORAL_TABLET | Freq: Every morning | ORAL | Status: DC
  Filled 2013-06-01: qty 1

## 2013-06-01 MED ORDER — KETOROLAC TROMETHAMINE 30 MG/ML IJ SOLN
15.0000 mg | Freq: Four times a day (QID) | INTRAMUSCULAR | Status: DC
  Administered 2013-06-01 – 2013-06-02 (×3): 15 mg via INTRAVENOUS
  Filled 2013-06-01 (×7): qty 1

## 2013-06-01 MED ORDER — HYDROCODONE-ACETAMINOPHEN 5-325 MG PO TABS: 1.0000 | ORAL_TABLET | ORAL | Status: DC | PRN

## 2013-06-01 MED ORDER — OXYCODONE HCL 5 MG/5ML PO SOLN: 5.0000 mg | Freq: Once | ORAL | Status: DC | PRN

## 2013-06-01 MED ORDER — SENNA 8.6 MG PO TABS: 1.0000 | ORAL_TABLET | Freq: Two times a day (BID) | ORAL | Status: DC

## 2013-06-01 MED ORDER — SODIUM CHLORIDE 0.9 % IJ SOLN: 3.0000 mL | INTRAMUSCULAR | Status: DC | PRN

## 2013-06-01 MED ORDER — FENTANYL CITRATE 0.05 MG/ML IJ SOLN
INTRAMUSCULAR | Status: DC | PRN
  Administered 2013-06-01 (×3): 50 ug via INTRAVENOUS

## 2013-06-01 MED ORDER — 0.9 % SODIUM CHLORIDE (POUR BTL) OPTIME
TOPICAL | Status: DC | PRN
  Administered 2013-06-01: 1000 mL

## 2013-06-01 MED ORDER — AMLODIPINE BESYLATE 10 MG PO TABS
10.0000 mg | ORAL_TABLET | Freq: Every morning | ORAL | Status: DC
  Filled 2013-06-01: qty 1

## 2013-06-01 MED ORDER — TEMAZEPAM 15 MG PO CAPS: 15.0000 mg | ORAL_CAPSULE | Freq: Every evening | ORAL | Status: DC | PRN

## 2013-06-01 MED ORDER — HEMOSTATIC AGENTS (NO CHARGE) OPTIME
TOPICAL | Status: DC | PRN
  Administered 2013-06-01: 1 via TOPICAL

## 2013-06-01 MED ORDER — IRBESARTAN 150 MG PO TABS
150.0000 mg | ORAL_TABLET | Freq: Every morning | ORAL | Status: DC
  Filled 2013-06-01: qty 1

## 2013-06-01 MED ORDER — ARTIFICIAL TEARS OP OINT
TOPICAL_OINTMENT | OPHTHALMIC | Status: DC | PRN
  Administered 2013-06-01: 1 via OPHTHALMIC

## 2013-06-01 MED ORDER — TRIMETHOPRIM 100 MG PO TABS
100.0000 mg | ORAL_TABLET | Freq: Every day | ORAL | Status: DC
  Filled 2013-06-01: qty 1

## 2013-06-01 MED ORDER — THROMBIN 5000 UNITS EX SOLR
CUTANEOUS | Status: DC | PRN
  Administered 2013-06-01 (×2): 5000 [IU] via TOPICAL

## 2013-06-01 MED ORDER — NEOSTIGMINE METHYLSULFATE 1 MG/ML IJ SOLN
INTRAMUSCULAR | Status: DC | PRN
  Administered 2013-06-01: 5 mg via INTRAVENOUS

## 2013-06-01 MED ORDER — BUPIVACAINE HCL (PF) 0.25 % IJ SOLN
INTRAMUSCULAR | Status: DC | PRN
  Administered 2013-06-01: 20 mL

## 2013-06-01 MED ORDER — OXYCODONE-ACETAMINOPHEN 5-325 MG PO TABS: 1.0000 | ORAL_TABLET | ORAL | Status: DC | PRN

## 2013-06-01 MED ORDER — LACTATED RINGERS IV SOLN
INTRAVENOUS | Status: DC
  Administered 2013-06-01 (×2): via INTRAVENOUS

## 2013-06-01 MED ORDER — HYDROMORPHONE HCL PF 1 MG/ML IJ SOLN: 0.5000 mg | INTRAMUSCULAR | Status: DC | PRN

## 2013-06-01 MED ORDER — SODIUM CHLORIDE 0.9 % IJ SOLN
3.0000 mL | Freq: Two times a day (BID) | INTRAMUSCULAR | Status: DC
  Administered 2013-06-01 (×2): 3 mL via INTRAVENOUS

## 2013-06-01 MED ORDER — CYANOCOBALAMIN 500 MCG PO TABS
500.0000 ug | ORAL_TABLET | Freq: Every day | ORAL | Status: DC
  Filled 2013-06-01: qty 1

## 2013-06-01 MED ORDER — BACITRACIN 50000 UNITS IM SOLR
INTRAMUSCULAR | Status: DC | PRN
  Administered 2013-06-01: 11:00:00

## 2013-06-01 MED ORDER — ONDANSETRON HCL 4 MG/2ML IJ SOLN: 4.0000 mg | INTRAMUSCULAR | Status: DC | PRN

## 2013-06-01 MED ORDER — SENNA 8.6 MG PO TABS
1.0000 | ORAL_TABLET | Freq: Two times a day (BID) | ORAL | Status: DC
Start: 2013-06-01 — End: 2013-06-02
  Administered 2013-06-01: 8.6 mg via ORAL
  Filled 2013-06-01 (×3): qty 1

## 2013-06-01 MED ORDER — ZOLPIDEM TARTRATE 5 MG PO TABS: 5.0000 mg | ORAL_TABLET | Freq: Every evening | ORAL | Status: DC | PRN

## 2013-06-01 MED ORDER — PHENOL 1.4 % MT LIQD: 1.0000 | OROMUCOSAL | Status: DC | PRN

## 2013-06-01 MED ORDER — LIDOCAINE HCL (CARDIAC) 20 MG/ML IV SOLN
INTRAVENOUS | Status: DC | PRN
  Administered 2013-06-01: 80 mg via INTRAVENOUS

## 2013-06-01 MED ORDER — ROCURONIUM BROMIDE 100 MG/10ML IV SOLN
INTRAVENOUS | Status: DC | PRN
  Administered 2013-06-01: 50 mg via INTRAVENOUS

## 2013-06-01 MED ORDER — EPHEDRINE SULFATE 50 MG/ML IJ SOLN
INTRAMUSCULAR | Status: DC | PRN
  Administered 2013-06-01 (×7): 5 mg via INTRAVENOUS

## 2013-06-01 SURGICAL SUPPLY — 56 items
ADH SKN CLS APL DERMABOND .7 (GAUZE/BANDAGES/DRESSINGS)
APL SKNCLS STERI-STRIP NONHPOA (GAUZE/BANDAGES/DRESSINGS) ×2
BAG DECANTER FOR FLEXI CONT (MISCELLANEOUS) ×2 IMPLANT
BENZOIN TINCTURE PRP APPL 2/3 (GAUZE/BANDAGES/DRESSINGS) ×3 IMPLANT
BLADE SURG ROTATE 9660 (MISCELLANEOUS) IMPLANT
BRUSH SCRUB EZ PLAIN DRY (MISCELLANEOUS) ×2 IMPLANT
BUR CUTTER 7.0 ROUND (BURR) ×2 IMPLANT
CANISTER SUCT 3000ML (MISCELLANEOUS) ×2 IMPLANT
CONT SPEC 4OZ CLIKSEAL STRL BL (MISCELLANEOUS) ×2 IMPLANT
DECANTER SPIKE VIAL GLASS SM (MISCELLANEOUS) ×1 IMPLANT
DERMABOND ADVANCED (GAUZE/BANDAGES/DRESSINGS)
DERMABOND ADVANCED .7 DNX12 (GAUZE/BANDAGES/DRESSINGS) ×1 IMPLANT
DRAPE LAPAROTOMY 100X72X124 (DRAPES) ×2 IMPLANT
DRAPE MICROSCOPE LEICA (MISCELLANEOUS) ×1 IMPLANT
DRAPE MICROSCOPE ZEISS OPMI (DRAPES) ×1 IMPLANT
DRAPE POUCH INSTRU U-SHP 10X18 (DRAPES) ×2 IMPLANT
DRAPE PROXIMA HALF (DRAPES) IMPLANT
DRAPE SURG 17X23 STRL (DRAPES) ×4 IMPLANT
DRSG OPSITE 4X5.5 SM (GAUZE/BANDAGES/DRESSINGS) ×1 IMPLANT
DRSG OPSITE POSTOP 4X6 (GAUZE/BANDAGES/DRESSINGS) ×1 IMPLANT
DURAPREP 26ML APPLICATOR (WOUND CARE) ×2 IMPLANT
ELECT REM PT RETURN 9FT ADLT (ELECTROSURGICAL) ×2
ELECTRODE REM PT RTRN 9FT ADLT (ELECTROSURGICAL) ×1 IMPLANT
EVACUATOR 1/8 PVC DRAIN (DRAIN) ×1 IMPLANT
GAUZE SPONGE 4X4 16PLY XRAY LF (GAUZE/BANDAGES/DRESSINGS) IMPLANT
GLOVE BIO SURGEON STRL SZ 6.5 (GLOVE) ×1 IMPLANT
GLOVE BIOGEL PI IND STRL 7.0 (GLOVE) IMPLANT
GLOVE BIOGEL PI INDICATOR 7.0 (GLOVE) ×2
GLOVE ECLIPSE 7.5 STRL STRAW (GLOVE) ×1 IMPLANT
GLOVE ECLIPSE 8.5 STRL (GLOVE) ×2 IMPLANT
GLOVE EXAM NITRILE LRG STRL (GLOVE) IMPLANT
GLOVE EXAM NITRILE MD LF STRL (GLOVE) IMPLANT
GLOVE EXAM NITRILE XL STR (GLOVE) IMPLANT
GLOVE EXAM NITRILE XS STR PU (GLOVE) IMPLANT
GLOVE SURG SS PI 7.0 STRL IVOR (GLOVE) ×3 IMPLANT
GOWN BRE IMP SLV AUR LG STRL (GOWN DISPOSABLE) ×3 IMPLANT
GOWN BRE IMP SLV AUR XL STRL (GOWN DISPOSABLE) ×2 IMPLANT
GOWN STRL REIN 2XL LVL4 (GOWN DISPOSABLE) IMPLANT
KIT BASIN OR (CUSTOM PROCEDURE TRAY) ×2 IMPLANT
KIT ROOM TURNOVER OR (KITS) ×2 IMPLANT
NDL SPNL 22GX3.5 QUINCKE BK (NEEDLE) ×1 IMPLANT
NEEDLE HYPO 22GX1.5 SAFETY (NEEDLE) ×2 IMPLANT
NEEDLE SPNL 22GX3.5 QUINCKE BK (NEEDLE) ×2 IMPLANT
NS IRRIG 1000ML POUR BTL (IV SOLUTION) ×2 IMPLANT
PACK LAMINECTOMY NEURO (CUSTOM PROCEDURE TRAY) ×2 IMPLANT
PAD ARMBOARD 7.5X6 YLW CONV (MISCELLANEOUS) ×8 IMPLANT
RUBBERBAND STERILE (MISCELLANEOUS) ×4 IMPLANT
SPONGE GAUZE 4X4 12PLY (GAUZE/BANDAGES/DRESSINGS) ×2 IMPLANT
SPONGE SURGIFOAM ABS GEL SZ50 (HEMOSTASIS) ×2 IMPLANT
STRIP CLOSURE SKIN 1/2X4 (GAUZE/BANDAGES/DRESSINGS) ×2 IMPLANT
SUT VIC AB 2-0 CT1 18 (SUTURE) ×3 IMPLANT
SUT VIC AB 3-0 SH 8-18 (SUTURE) ×2 IMPLANT
SYR 20ML ECCENTRIC (SYRINGE) ×2 IMPLANT
TOWEL OR 17X24 6PK STRL BLUE (TOWEL DISPOSABLE) ×2 IMPLANT
TOWEL OR 17X26 10 PK STRL BLUE (TOWEL DISPOSABLE) ×2 IMPLANT
WATER STERILE IRR 1000ML POUR (IV SOLUTION) ×2 IMPLANT

## 2013-06-01 NOTE — Anesthesia Procedure Notes (Signed)
Procedure Name: Intubation Date/Time: 06/01/2013 10:16 AM Performed by: Maryland Pink Pre-anesthesia Checklist: Patient identified, Timeout performed, Emergency Drugs available, Suction available and Patient being monitored Patient Re-evaluated:Patient Re-evaluated prior to inductionOxygen Delivery Method: Circle system utilized Preoxygenation: Pre-oxygenation with 100% oxygen Intubation Type: IV induction Ventilation: Mask ventilation without difficulty and Oral airway inserted - appropriate to patient size Laryngoscope Size: Mac and 3 Grade View: Grade I Tube type: Oral Tube size: 7.5 mm Number of attempts: 1 Airway Equipment and Method: Stylet Placement Confirmation: ETT inserted through vocal cords under direct vision,  positive ETCO2 and breath sounds checked- equal and bilateral Secured at: 21 cm Tube secured with: Tape Dental Injury: Teeth and Oropharynx as per pre-operative assessment  Comments: Soft bite block placed

## 2013-06-01 NOTE — Op Note (Signed)
Date of procedure: 06/01/2013  Date of dictation:  same  Service: Neurosurgery  Preoperative diagnosis: Left L3-4 spondylosis with stenosis and associated left L3-4 disc herniation. Left L3-4 spondylosis and stenosis with neurogenic claudication.  Postoperative diagnosis: Same  Procedure Name: Left L3-4 decompressive laminotomy with left L3 and left L4 decompressive foraminotomy, left L3-4 microdiscectomy  Left L4-5 decompressive laminotomy with left L5 foraminotomy.  Surgeon:Neel Buffone A.Dujuan Stankowski, M.D.  Asst. Surgeon: Luiz Ochoa  Anesthesia: General  Indication: The patient is a 80 year old male with severe back and left lower extremity pain aggravated by standing and walking. Workup demonstrates evidence of marked stenosis secondary to spondylosis at L3-4 and L4-5. Patient has evidence of an associated disc herniation L3-4 on the left as well. Patient's failed conservative management and presents now for decompressive surgery on the left at L3-4 L4-5.  Operative note: After induction anesthesia, patient positioned prone onto Wilson frame and appropriately padded. Lumbar region prepped and draped. Incision made overlying L4-5. Dissection performed on the left side. Retractor placed. X-ray taken. And the levels were found to be at L5-S1. Dissection was redirected 1 level cephalad. Retractor was replaced. Decompressive laminotomies were then performed at both L3-4 on the left and L4-5 on the left using high-speed drill and Kerrison rongeurs to remove the inferior aspect of lamina above the medial aspect the facet joint and the superior rim of the lamina below. Ligament flavum was elevated and resected these will fashion at both L3-4 and L4-5. Underlying thecal sac and exiting L4 nerve root and L5 nerve root L5. Microscope was then brought into the field and used throughout the remainder of the microdiscectomy is for microdissection the spinal canal. Starting first at L3-4 on the left side. The L3 nerve root  was then applied proximally and L3 foraminotomy was completed along its course by undercutting the facet joint and resecting overgrown ligamentum flavum. Thecal sac and L4 nerve root gently mobilized retracted towards midline. Underlying disc herniation was readily apparent. Disc space itself was incised 15 blade in a rectangular fashion. Wide disc space cleanout was achieved using pituitary rongeurs operative of rongeurs and Epstein curettes. All elements the disc herniation were completely resected. All loose were as he jammed his peers nuclear space. This went very thorough discectomy been achieved. There is no evidence of injury to thecal sac or nerve roots. The L4-5 space was inspected. The disc space itself was grossly bulging but mostly spondylitic and without any soft component. I do not think the discectomy this level would be beneficial. Wounds were then irrigated out like solution. Gelfoam was placed topically for hemostasis. Medium Hemovac drain was left at per space. Wounds and close in layers with Vicryl sutures. Steri-Strips and sterile dressing were applied. There were no apparent competitions. Patient tolerated the procedure well and he returns to the recovery postop.

## 2013-06-01 NOTE — Brief Op Note (Signed)
06/01/2013  12:22 PM  PATIENT:  Guy Begin  80 y.o. male  PRE-OPERATIVE DIAGNOSIS:  HNP  POST-OPERATIVE DIAGNOSIS:  Herniated Nucleous Pulposus  PROCEDURE:  Procedure(s) with comments: LEFT LUMBAR THREE-FOUR DECOMPRESSION LUMBAR LAMINECTOMY/MICRODISCECTOMY;POSSIBLE LUMBAR FOUR-FIVE (Left) - left  SURGEON:  Surgeon(s) and Role:    * Charlie Pitter, MD - Primary    * Otilio Connors, MD - Assisting  PHYSICIAN ASSISTANT:   ASSISTANTS:    ANESTHESIA:   general  EBL:  Total I/O In: 1200 [I.V.:1200] Out: 150 [Blood:150]  BLOOD ADMINISTERED:none  DRAINS: (med) Hemovact drain(s) in the epidural space with  Suction Open   LOCAL MEDICATIONS USED:  MARCAINE     SPECIMEN:  No Specimen  DISPOSITION OF SPECIMEN:  N/A  COUNTS:  YES  TOURNIQUET:  * No tourniquets in log *  DICTATION: .Dragon Dictation  PLAN OF CARE: Admit to inpatient   PATIENT DISPOSITION:  PACU - hemodynamically stable.   Delay start of Pharmacological VTE agent (>24hrs) due to surgical blood loss or risk of bleeding: yes

## 2013-06-01 NOTE — Progress Notes (Signed)
Utilization review completed.  

## 2013-06-01 NOTE — Preoperative (Signed)
Beta Blockers   Reason not to administer Beta Blockers:Not Applicable 

## 2013-06-01 NOTE — Anesthesia Postprocedure Evaluation (Signed)
  Anesthesia Post-op Note  Patient: [name]  Procedure(s) Performed: Procedure(s) with comments: LEFT LUMBAR THREE-FOUR DECOMPRESSION LUMBAR LAMINECTOMY/MICRODISCECTOMY;POSSIBLE LUMBAR FOUR-FIVE (Left) - left  Patient Location: PACU  Anesthesia Type:General  Level of Consciousness: awake, alert  and oriented  Airway and Oxygen Therapy: Patient Spontanous Breathing and Patient connected to nasal cannula oxygen  Post-op Pain: mild  Post-op Assessment: Post-op Vital signs reviewed  Post-op Vital Signs: Reviewed  Complications: No apparent anesthesia complications

## 2013-06-01 NOTE — Plan of Care (Signed)
Problem: Consults Goal: Diagnosis - Spinal Surgery Outcome: Completed/Met Date Met:  06/01/13 Lumbar Laminectomy (Complex)

## 2013-06-01 NOTE — Anesthesia Preprocedure Evaluation (Signed)
Anesthesia Evaluation  Patient identified by MRN, date of birth, ID band Patient awake    Reviewed: Allergy & Precautions, H&P , NPO status , Patient's Chart, lab work & pertinent test results  Airway Mallampati: II TM Distance: >3 FB Neck ROM: Full    Dental  (+) Teeth Intact and Dental Advisory Given   Pulmonary  breath sounds clear to auscultation        Cardiovascular hypertension, Pt. on medications Rhythm:Regular Rate:Normal     Neuro/Psych    GI/Hepatic   Endo/Other    Renal/GU      Musculoskeletal   Abdominal   Peds  Hematology   Anesthesia Other Findings Hard of hearing and hearing aids were taken out in pre op.  Reproductive/Obstetrics                           Anesthesia Physical Anesthesia Plan  ASA: II  Anesthesia Plan: General   Post-op Pain Management:    Induction: Intravenous  Airway Management Planned: Oral ETT  Additional Equipment:   Intra-op Plan:   Post-operative Plan: Extubation in OR  Informed Consent: I have reviewed the patients History and Physical, chart, labs and discussed the procedure including the risks, benefits and alternatives for the proposed anesthesia with the patient or authorized representative who has indicated his/her understanding and acceptance.   Dental advisory given  Plan Discussed with: CRNA, Anesthesiologist and Surgeon  Anesthesia Plan Comments:         Anesthesia Quick Evaluation

## 2013-06-01 NOTE — Transfer of Care (Signed)
Immediate Anesthesia Transfer of Care Note  Patient: [name]  Procedure(s) Performed: Procedure(s) with comments: LEFT LUMBAR THREE-FOUR DECOMPRESSION LUMBAR LAMINECTOMY/MICRODISCECTOMY;POSSIBLE LUMBAR FOUR-FIVE (Left) - left  Patient Location: PACU  Anesthesia Type:General  Level of Consciousness: awake, alert  and oriented  Airway & Oxygen Therapy: Patient Spontanous Breathing  Post-op Assessment: Report given to PACU RN  Post vital signs: Reviewed and stable  Complications: No apparent anesthesia complications

## 2013-06-01 NOTE — H&P (Signed)
[name] is an 80 y.o. male.   Chief Complaint: Back and left leg pain HPI: 80 year old male with progressive back and lower extremity pain. Workup demonstrates evidence of marked lumbar stenosis particularly at L3-4 and L4-5 secondary to facet arthropathy and a broad-based disc herniation at L3-4 on the left side. At L4-5 the patient has facet arthropathy and stenosis bilaterally. He has failed conservative management and presents now for decompressive surgery in hopes of improving his symptoms.  Past Medical History  Diagnosis Date  . Neuropathy   . Enlarged prostate     takes Flomax daily  . Neuromuscular disorder 02/14/13    damnage nerves in bil feet  . Cancer 02/14/13    skin cancer  . Arthritis   . Gout     takes Allopurinol daily  . Insomnia     takes Restoril nightly  . History of UTI     takes Trimethoprim daily  . Hypertension     takes Amlodipine and Avapro daily  . History of bronchitis     last time in Dec 2014  . Numbness     both feet  . Peripheral neuropathy   . Joint pain   . Joint swelling   . History of colon polyps   . Hemorrhoids     occasionally  . History of kidney stones   . Anemia     takes Iron pill daily  . History of blood transfusion     no abnormal reaction noted  . Cataract     right eye  and immature  . Depression     no meds required  . Impaired hearing     wears hearing aids    Past Surgical History  Procedure Laterality Date  . Intramedullary (im) nail intertrochanteric Right 02/15/2013    Procedure: INTRAMEDULLARY (IM) NAIL INTERTROCHANTRIC;  Surgeon: Rozanna Box, MD;  Location: [place];  Service: Orthopedics;  Laterality: Right;  . Colonoscopy  2010  . Appendectomy  1964  . Tonsillectomy  1970/2006  . Hemorrhoid surgery  1970/2006  . Laparoscopic surgery   2006    d/t blockage in intestine   . Basal cell removed from back and face   2005  . Basal cell removed from cheek and nose  2009  . Basal cell removed from left ear   2014  . Joint replacement Left 09  . Right hip surgery  2014  . Esophagogastroduodenoscopy      No family history on file. Social History:  reports that he has never smoked. He does not have any smokeless tobacco history on file. He reports that he does not drink alcohol or use illicit drugs.  Allergies:  Allergies  Allergen Reactions  . Adhesive [Tape]     redness  . Ativan [Lorazepam] Other (See Comments)    Hallucinations.  Has an adverse reaction to medication.     Medications Prior to Admission  Medication Sig Dispense Refill  . acetaminophen (TYLENOL) 500 MG tablet Take 1,000 mg by mouth every 6 (six) hours as needed for pain.      Marland Kitchen allopurinol (ZYLOPRIM) 300 MG tablet Take 300 mg by mouth every morning.      Marland Kitchen amLODipine (NORVASC) 10 MG tablet Take 10 mg by mouth every morning.      . cyanocobalamin 500 MCG tablet Take 500 mcg by mouth daily.      . ferrous fumarate-iron polysaccharide complex (TANDEM) 162-115.2 MG CAPS Take 1 capsule by mouth daily with breakfast.      .  gabapentin (NEURONTIN) 600 MG tablet Take 600 mg by mouth 2 (two) times daily.      Marland Kitchen ibuprofen (ADVIL,MOTRIN) 800 MG tablet Take 1 tablet (800 mg total) by mouth every 6 (six) hours as needed.  30 tablet  0  . irbesartan (AVAPRO) 300 MG tablet Take 150 mg by mouth every morning.       . Multiple Vitamin (MULTIVITAMIN WITH MINERALS) TABS tablet Take 1 tablet by mouth daily.      . tamsulosin (FLOMAX) 0.4 MG CAPS capsule Take 0.4 mg by mouth daily.      . temazepam (RESTORIL) 15 MG capsule Take 15 mg by mouth at bedtime as needed for sleep.      Marland Kitchen trimethoprim (TRIMPEX) 100 MG tablet Take 100 mg by mouth daily. Takes to keep from getting UTI s.      Marland Kitchen zolpidem (AMBIEN) 10 MG tablet Take 10 mg by mouth at bedtime as needed for sleep.        No results found for this or any previous visit (from the past 48 hour(s)). No results found.  Review of Systems  Constitutional: Negative.   HENT: Negative.    Eyes: Negative.   Respiratory: Negative.   Cardiovascular: Negative.   Gastrointestinal: Negative.   Genitourinary: Negative.   Musculoskeletal: Negative.   Skin: Negative.   Neurological: Negative.   Endo/Heme/Allergies: Negative.   Psychiatric/Behavioral: Negative.     Blood pressure 160/95, pulse 96, temperature 98 F (36.7 C), temperature source Oral, resp. rate 20. Physical Exam  Constitutional: He is oriented to person, place, and time. He appears well-developed and well-nourished.  HENT:  Head: Normocephalic and atraumatic.  Right Ear: External ear normal.  Left Ear: External ear normal.  Nose: Nose normal.  Mouth/Throat: Oropharynx is clear and moist.  Eyes: Conjunctivae and EOM are normal. Pupils are equal, round, and reactive to light. Right eye exhibits no discharge. Left eye exhibits no discharge.  Neck: Normal range of motion. Neck supple. No tracheal deviation present. No thyromegaly present.  Cardiovascular: Normal rate, regular rhythm, normal heart sounds and intact distal pulses.  Exam reveals no friction rub.   No murmur heard. Respiratory: Effort normal and breath sounds normal. No respiratory distress. He has no wheezes.  GI: Soft. Bowel sounds are normal. He exhibits no distension. There is no tenderness.  Musculoskeletal: Normal range of motion. He exhibits no edema and no tenderness.  Neurological: He is alert and oriented to person, place, and time. He has normal reflexes. He displays normal reflexes. No cranial nerve deficit. Coordination normal.  Left quadriceps weakness 4 minus over 5. Left dorsiflexion weakness 4/5. Distal sensory loss in both lower extremities. Hypoactive reflexes bilateral lower extremities.  Skin: Skin is warm and dry. No rash noted. No erythema. No pallor.  Psychiatric: He has a normal mood and affect. His behavior is normal. Judgment and thought content normal.     Assessment/Plan Left L3-4 spondylosis and herniated nucleus  pulposus with radiculopathy and stenosis. Left L4-5 spondylosis and stenosis with neurogenic claudication. Plan left L3-4 and left L4-5 decompressive laminotomies and foraminotomies with probable left L3-4 microdiscectomy. Risks and benefits been explained. Patient wishes to proceed.  Longino Trefz A 06/01/2013, 10:00 AM

## 2013-06-02 MED ORDER — OXYCODONE-ACETAMINOPHEN 5-325 MG PO TABS: 1.0000 | ORAL_TABLET | ORAL | Status: DC | PRN

## 2013-06-02 NOTE — Progress Notes (Signed)
  Pt. Alert and oriented,follows simple instructions, denies pain. Incision area without swelling, redness or S/S of infection. Voiding adequate clear yellow urine. Moving all extremities well and vitals stable and documented. Patient discharged home with spouse. Lumbar surgery notes instructions given to patient and family member for home safety and precautions. Pt. and family stated understanding of instructions given.  

## 2013-06-02 NOTE — Progress Notes (Signed)
Subjective: Patient reports doing well  Objective: Vital signs in last 24 hours: Temp:  [97 F (36.1 C)-99.4 F (37.4 C)] 98.4 F (36.9 C) (01/17 0812) Pulse Rate:  [77-121] 94 (01/17 0812) Resp:  [14-25] 18 (01/17 0812) BP: (124-162)/(68-97) 144/79 mmHg (01/17 0812) SpO2:  [91 %-99 %] 95 % (01/17 0812)  Intake/Output from previous day: 01/16 0701 - 01/17 0700 In: 1560 [P.O.:360; I.V.:1200] Out: 925 [Urine:600; Drains:175; Blood:150] Intake/Output this shift:    Physical Exam: Full strength.  Dressing CDI.  Drain minimal output  Lab Results:  Recent Labs  05/30/13 0843  WBC 8.5  HGB 12.3*  HCT 36.4*  PLT 179   BMET  Recent Labs  05/30/13 0843  NA 138  K 4.2  CL 101  CO2 25  GLUCOSE 118*  BUN 14  CREATININE 1.54*  CALCIUM 9.0    Studies/Results: Dg Lumbar Spine 1 View  06/01/2013   CLINICAL DATA:  Lumbar laminectomy.  EXAM: LUMBAR SPINE - 1 VIEW  COMPARISON:  None.  FINDINGS: Intraoperative localization film was obtained in lateral projection. The shows a probe over the L5-S1 interspace.  IMPRESSION: Intraoperative localization at L5-S1.   Electronically Signed   By: Dereck Ligas M.D.   On: 06/01/2013 12:07    Assessment/Plan: Doing well.  D/C home    LOS: 1 day    Peggyann Shoals, MD 06/02/2013, 8:36 AM

## 2013-06-02 NOTE — Discharge Summary (Signed)
Physician Discharge Summary  Patient ID: [name] MRN: [id number] DOB/AGE: 1931-07-12 80 y.o.  Admit date: 06/01/2013 Discharge date: 06/02/2013  Admission Diagnoses:Lumbar spinal stenosis  Discharge Diagnoses: Lumbar spinal stenosis Principal Problem:   Spinal stenosis, lumbar region, with neurogenic claudication Active Problems:   Lumbar stenosis with neurogenic claudication   Discharged Condition: good  Hospital Course: Uncomplicated decompression for lumbar spinal stenosis L 34 L 45  Consults: None  Significant Diagnostic Studies: None  Treatments:  decompression for lumbar spinal stenosis L 34 L 45  Discharge Exam: Blood pressure 144/79, pulse 94, temperature 98.4 F (36.9 C), temperature source Oral, resp. rate 18, SpO2 95.00%. Neurologic: Alert and oriented X 3, normal strength and tone. Normal symmetric reflexes. Normal coordination and gait Wound:CDI  Disposition: Home     Medication List         acetaminophen 500 MG tablet  Commonly known as:  TYLENOL  Take 1,000 mg by mouth every 6 (six) hours as needed for pain.     allopurinol 300 MG tablet  Commonly known as:  ZYLOPRIM  Take 300 mg by mouth every morning.     amLODipine 10 MG tablet  Commonly known as:  NORVASC  Take 10 mg by mouth every morning.     cyanocobalamin 500 MCG tablet  Take 500 mcg by mouth daily.     ferrous fumarate-iron polysaccharide complex 162-115.2 MG Caps  Commonly known as:  TANDEM  Take 1 capsule by mouth daily with breakfast.     gabapentin 600 MG tablet  Commonly known as:  NEURONTIN  Take 600 mg by mouth 2 (two) times daily.     ibuprofen 800 MG tablet  Commonly known as:  ADVIL,MOTRIN  Take 1 tablet (800 mg total) by mouth every 6 (six) hours as needed.     irbesartan 300 MG tablet  Commonly known as:  AVAPRO  Take 150 mg by mouth every morning.     multivitamin with minerals Tabs tablet  Take 1 tablet by mouth daily.     oxyCODONE-acetaminophen 5-325  MG per tablet  Commonly known as:  PERCOCET/ROXICET  Take 1-2 tablets by mouth every 4 (four) hours as needed for moderate pain.     tamsulosin 0.4 MG Caps capsule  Commonly known as:  FLOMAX  Take 0.4 mg by mouth daily.     temazepam 15 MG capsule  Commonly known as:  RESTORIL  Take 15 mg by mouth at bedtime as needed for sleep.     trimethoprim 100 MG tablet  Commonly known as:  TRIMPEX  Take 100 mg by mouth daily. Takes to keep from getting UTI s.     zolpidem 10 MG tablet  Commonly known as:  AMBIEN  Take 10 mg by mouth at bedtime as needed for sleep.         Signed: Peggyann Shoals, MD 06/02/2013, 8:38 AM

## 2013-06-02 NOTE — Discharge Instructions (Signed)

## 2013-06-05 ENCOUNTER — Encounter (HOSPITAL_COMMUNITY): Payer: Self-pay | Admitting: Neurosurgery

## 2013-06-07 DIAGNOSIS — M48061 Spinal stenosis, lumbar region without neurogenic claudication: Secondary | ICD-10-CM | POA: Diagnosis not present

## 2013-06-07 DIAGNOSIS — M6281 Muscle weakness (generalized): Secondary | ICD-10-CM | POA: Diagnosis not present

## 2013-06-07 DIAGNOSIS — G589 Mononeuropathy, unspecified: Secondary | ICD-10-CM | POA: Diagnosis not present

## 2013-06-18 DIAGNOSIS — N183 Chronic kidney disease, stage 3 unspecified: Secondary | ICD-10-CM | POA: Diagnosis not present

## 2013-06-25 DIAGNOSIS — N401 Enlarged prostate with lower urinary tract symptoms: Secondary | ICD-10-CM | POA: Diagnosis not present

## 2013-06-25 DIAGNOSIS — Z23 Encounter for immunization: Secondary | ICD-10-CM | POA: Diagnosis not present

## 2013-06-25 DIAGNOSIS — I10 Essential (primary) hypertension: Secondary | ICD-10-CM | POA: Diagnosis not present

## 2013-06-25 DIAGNOSIS — N139 Obstructive and reflux uropathy, unspecified: Secondary | ICD-10-CM | POA: Diagnosis not present

## 2013-06-25 DIAGNOSIS — N183 Chronic kidney disease, stage 3 unspecified: Secondary | ICD-10-CM | POA: Diagnosis not present

## 2013-07-02 DIAGNOSIS — IMO0001 Reserved for inherently not codable concepts without codable children: Secondary | ICD-10-CM | POA: Diagnosis not present

## 2013-07-02 DIAGNOSIS — S7223XA Displaced subtrochanteric fracture of unspecified femur, initial encounter for closed fracture: Secondary | ICD-10-CM | POA: Diagnosis not present

## 2013-07-09 DIAGNOSIS — H1130 Conjunctival hemorrhage, unspecified eye: Secondary | ICD-10-CM | POA: Diagnosis not present

## 2013-07-10 DIAGNOSIS — L570 Actinic keratosis: Secondary | ICD-10-CM | POA: Diagnosis not present

## 2013-07-10 DIAGNOSIS — L578 Other skin changes due to chronic exposure to nonionizing radiation: Secondary | ICD-10-CM | POA: Diagnosis not present

## 2013-07-23 DIAGNOSIS — M76899 Other specified enthesopathies of unspecified lower limb, excluding foot: Secondary | ICD-10-CM | POA: Diagnosis not present

## 2013-07-23 DIAGNOSIS — T847XXA Infection and inflammatory reaction due to other internal orthopedic prosthetic devices, implants and grafts, initial encounter: Secondary | ICD-10-CM | POA: Diagnosis not present

## 2013-07-23 DIAGNOSIS — S7223XA Displaced subtrochanteric fracture of unspecified femur, initial encounter for closed fracture: Secondary | ICD-10-CM | POA: Diagnosis not present

## 2013-07-23 DIAGNOSIS — IMO0001 Reserved for inherently not codable concepts without codable children: Secondary | ICD-10-CM | POA: Diagnosis not present

## 2013-08-02 DIAGNOSIS — M79609 Pain in unspecified limb: Secondary | ICD-10-CM | POA: Diagnosis not present

## 2013-08-02 DIAGNOSIS — B351 Tinea unguium: Secondary | ICD-10-CM | POA: Diagnosis not present

## 2013-08-03 DIAGNOSIS — F3289 Other specified depressive episodes: Secondary | ICD-10-CM | POA: Diagnosis not present

## 2013-08-03 DIAGNOSIS — F329 Major depressive disorder, single episode, unspecified: Secondary | ICD-10-CM | POA: Diagnosis not present

## 2013-08-03 DIAGNOSIS — I10 Essential (primary) hypertension: Secondary | ICD-10-CM | POA: Diagnosis not present

## 2013-08-06 ENCOUNTER — Other Ambulatory Visit: Payer: Self-pay | Admitting: Orthopedic Surgery

## 2013-08-15 DIAGNOSIS — N401 Enlarged prostate with lower urinary tract symptoms: Secondary | ICD-10-CM | POA: Diagnosis not present

## 2013-08-15 DIAGNOSIS — B3789 Other sites of candidiasis: Secondary | ICD-10-CM | POA: Diagnosis not present

## 2013-08-15 DIAGNOSIS — Z125 Encounter for screening for malignant neoplasm of prostate: Secondary | ICD-10-CM | POA: Diagnosis not present

## 2013-08-15 DIAGNOSIS — N138 Other obstructive and reflux uropathy: Secondary | ICD-10-CM | POA: Diagnosis not present

## 2013-08-15 DIAGNOSIS — N209 Urinary calculus, unspecified: Secondary | ICD-10-CM | POA: Diagnosis not present

## 2013-08-22 ENCOUNTER — Encounter (HOSPITAL_COMMUNITY): Payer: Self-pay | Admitting: Pharmacy Technician

## 2013-08-24 ENCOUNTER — Encounter (HOSPITAL_COMMUNITY)
Admission: RE | Admit: 2013-08-24 | Discharge: 2013-08-24 | Disposition: A | Discharge status: Home / Self Care | Payer: Medicare Other | Source: Ambulatory Visit | Attending: Orthopedic Surgery | Admitting: Orthopedic Surgery

## 2013-08-24 ENCOUNTER — Encounter (HOSPITAL_COMMUNITY): Payer: Self-pay

## 2013-08-24 DIAGNOSIS — IMO0002 Reserved for concepts with insufficient information to code with codable children: Secondary | ICD-10-CM | POA: Diagnosis not present

## 2013-08-24 DIAGNOSIS — M25569 Pain in unspecified knee: Secondary | ICD-10-CM | POA: Diagnosis not present

## 2013-08-24 DIAGNOSIS — Z01812 Encounter for preprocedural laboratory examination: Secondary | ICD-10-CM | POA: Insufficient documentation

## 2013-08-24 DIAGNOSIS — M1710 Unilateral primary osteoarthritis, unspecified knee: Secondary | ICD-10-CM | POA: Diagnosis not present

## 2013-08-24 HISTORY — DX: Constipation, unspecified: K59.00

## 2013-08-24 HISTORY — DX: Calculus of kidney: N20.0

## 2013-08-24 HISTORY — DX: Shortness of breath: R06.02

## 2013-08-24 LAB — CBC WITH DIFFERENTIAL/PLATELET
BASOS PCT: 0 % (ref 0–1)
Basophils Absolute: 0 10*3/uL (ref 0.0–0.1)
EOS PCT: 1 % (ref 0–5)
Eosinophils Absolute: 0.1 10*3/uL (ref 0.0–0.7)
HCT: 35.3 % — ABNORMAL LOW (ref 39.0–52.0)
HEMOGLOBIN: 11.4 g/dL — AB (ref 13.0–17.0)
Lymphocytes Relative: 16 % (ref 12–46)
Lymphs Abs: 1.2 10*3/uL (ref 0.7–4.0)
MCH: 29.5 pg (ref 26.0–34.0)
MCHC: 32.3 g/dL (ref 30.0–36.0)
MCV: 91.2 fL (ref 78.0–100.0)
MONO ABS: 0.8 10*3/uL (ref 0.1–1.0)
Monocytes Relative: 10 % (ref 3–12)
Neutro Abs: 5.7 10*3/uL (ref 1.7–7.7)
Neutrophils Relative %: 73 % (ref 43–77)
Platelets: 182 10*3/uL (ref 150–400)
RBC: 3.87 MIL/uL — ABNORMAL LOW (ref 4.22–5.81)
RDW: 15.5 % (ref 11.5–15.5)
WBC: 7.8 10*3/uL (ref 4.0–10.5)

## 2013-08-24 LAB — COMPREHENSIVE METABOLIC PANEL
ALBUMIN: 3.8 g/dL (ref 3.5–5.2)
ALT: 16 U/L (ref 0–53)
AST: 25 U/L (ref 0–37)
Alkaline Phosphatase: 119 U/L — ABNORMAL HIGH (ref 39–117)
BILIRUBIN TOTAL: 0.4 mg/dL (ref 0.3–1.2)
BUN: 22 mg/dL (ref 6–23)
CALCIUM: 9 mg/dL (ref 8.4–10.5)
CO2: 23 mEq/L (ref 19–32)
CREATININE: 1.66 mg/dL — AB (ref 0.50–1.35)
Chloride: 103 mEq/L (ref 96–112)
GFR calc Af Amer: 43 mL/min — ABNORMAL LOW (ref >90)
GFR calc non Af Amer: 37 mL/min — ABNORMAL LOW (ref >90)
Glucose, Bld: 117 mg/dL — ABNORMAL HIGH (ref 70–99)
Potassium: 4.5 mEq/L (ref 3.7–5.3)
Sodium: 142 mEq/L (ref 137–147)
Total Protein: 7 g/dL (ref 6.0–8.3)

## 2013-08-24 LAB — TYPE AND SCREEN
ABO/RH(D): B NEG
Antibody Screen: NEGATIVE

## 2013-08-24 LAB — URINALYSIS, ROUTINE W REFLEX MICROSCOPIC
BILIRUBIN URINE: NEGATIVE
Glucose, UA: NEGATIVE mg/dL
Hgb urine dipstick: NEGATIVE
Ketones, ur: NEGATIVE mg/dL
LEUKOCYTES UA: NEGATIVE
NITRITE: NEGATIVE
PH: 6 (ref 5.0–8.0)
Protein, ur: NEGATIVE mg/dL
Specific Gravity, Urine: 1.02 (ref 1.005–1.030)
UROBILINOGEN UA: 0.2 mg/dL (ref 0.0–1.0)

## 2013-08-24 LAB — PROTIME-INR
INR: 1.09 (ref 0.00–1.49)
PROTHROMBIN TIME: 13.9 s (ref 11.6–15.2)

## 2013-08-24 LAB — SURGICAL PCR SCREEN
MRSA, PCR: NEGATIVE
STAPHYLOCOCCUS AUREUS: NEGATIVE

## 2013-08-24 LAB — APTT: APTT: 34 s (ref 24–37)

## 2013-08-24 NOTE — Pre-Procedure Instructions (Signed)
[name]  08/24/2013   Your procedure is scheduled on: Monday, September 03, 2013 at 10:00 AM  Report to [facility] Stay (use Main Entrance "A'') at 8:00 AM.  Call this number if you have problems the morning of surgery: [phone]   Remember:   Do not eat food or drink liquids after midnight Sunday, September 02, 2013   Take these medicines the morning of surgery with A SIP OF WATER: allopurinol (ZYLOPRIM,  amLODipine (NORVASC), sertraline (ZOLOFT), gabapentin (NEURONTIN), if needed: acetaminophen (TYLENOL) for pain  Stop taking Aspirin, Multivitamins and herbal medications. Do not take any NSAIDs ie: Ibuprofen, Advil, Naproxen or any medication containing Aspirin. Stop 5 days prior to procedure, Wednesday, 08/29/13.  Do not wear jewelry, make-up or nail polish.  Do not wear lotions, powders, or perfumes. You may wear deodorant.  Do not shave 48 hours prior to surgery. Men may shave face and neck.  Do not bring valuables to the hospital.  [facility] is not responsible for any belongings or valuables.               Contacts, dentures or bridgework may not be worn into surgery.  Leave suitcase in the car. After surgery it may be brought to your room.  For patients admitted to the hospital, discharge time is determined by your treatment team.               Patients discharged the day of surgery will not be allowed to drive  home.  Name and phone number of your driver:  Special Instructions:  Special Instructions:Special Instructions: [facility] - Preparing for Surgery  Before surgery, you can play an important role.  Because skin is not sterile, your skin needs to be as free of germs as possible.  You can reduce the number of germs on you skin by washing with CHG (chlorahexidine gluconate) soap before surgery.  CHG is an antiseptic cleaner which kills germs and bonds with the skin to continue killing germs even after washing.  Please DO NOT use if you have an allergy to CHG or  antibacterial soaps.  If your skin becomes reddened/irritated stop using the CHG and inform your nurse when you arrive at Short Stay.  Do not shave (including legs and underarms) for at least 48 hours prior to the first CHG shower.  You may shave your face.  Please follow these instructions carefully:   1.  Shower with CHG Soap the night before surgery and the morning of Surgery.  2.  If you choose to wash your hair, wash your hair first as usual with your normal shampoo.  3.  After you shampoo, rinse your hair and body thoroughly to remove the Shampoo.  4.  Use CHG as you would any other liquid soap.  You can apply chg directly  to the skin and wash gently with scrungie or a clean washcloth.  5.  Apply the CHG Soap to your body ONLY FROM THE NECK DOWN.  Do not use on open wounds or open sores.  Avoid contact with your eyes, ears, mouth and genitals (private parts).  Wash genitals (private parts) with your normal soap.  6.  Wash thoroughly, paying special attention to the area where your surgery will be performed.  7.  Thoroughly rinse your body with warm water from the neck down.  8.  DO NOT shower/wash with your normal soap after using and rinsing off the CHG Soap.  9.  Pat yourself dry with   a clean towel.            10.  Wear clean pajamas.            11.  Place clean sheets on your bed the night of your first shower and do not sleep with pets.  Day of Surgery  Do not apply any lotions the morning of surgery.  Please wear clean clothes to the hospital/surgery center.   Please read over the following fact sheets that you were giving:Blood Transfusion, Incentive Spirometry, Preparing For Surgery, Couging and Deep Breathing, PaIn, Surgical Infections.

## 2013-08-24 NOTE — Pre-Procedure Instructions (Signed)
[name]  08/24/2013   Your procedure is scheduled on: Monday, September 03, 2013 at 10:00 AM  Report to [facility] Short Stay (use Main Entrance "A'') at 8:00 AM.  Call this number if you have problems the morning of surgery: [phone]   Remember:   Do not eat food or drink liquids after midnight Sunday, September 02, 2013   Take these medicines the morning of surgery with A SIP OF WATER: allopurinol (ZYLOPRIM,  amLODipine (NORVASC), sertraline (ZOLOFT), gabapentin (NEURONTIN), if needed: acetaminophen (TYLENOL) for pain  Stop taking Aspirin, Multivitamins and herbal medications. Do not take any NSAIDs ie: Ibuprofen, Advil, Naproxen or any medication containing Aspirin. Stop 5 days prior to procedure, Wednesday, 08/29/13.  Do not wear jewelry, make-up or nail polish.  Do not wear lotions, powders, or perfumes. You may wear deodorant.  Do not shave 48 hours prior to surgery. Men may shave face and neck.  Do not bring valuables to the hospital.  [facility] is not responsible for any belongings or valuables.               Contacts, dentures or bridgework may not be worn into surgery.  Leave suitcase in the car. After surgery it may be brought to your room.  For patients admitted to the hospital, discharge time is determined by your treatment team.               Patients discharged the day of surgery will not be allowed to drive  home.  Name and phone number of your driver:  Special Instructions:  Special Instructions:Special Instructions: [facility] - Preparing for Surgery  Before surgery, you can play an important role.  Because skin is not sterile, your skin needs to be as free of germs as possible.  You can reduce the number of germs on you skin by washing with CHG (chlorahexidine gluconate) soap before surgery.  CHG is an antiseptic cleaner which kills germs and bonds with the skin to continue killing germs even after washing.  Please DO NOT use if you have an allergy to CHG or  antibacterial soaps.  If your skin becomes reddened/irritated stop using the CHG and inform your nurse when you arrive at Short Stay.  Do not shave (including legs and underarms) for at least 48 hours prior to the first CHG shower.  You may shave your face.  Please follow these instructions carefully:   1.  Shower with CHG Soap the night before surgery and the morning of Surgery.  2.  If you choose to wash your hair, wash your hair first as usual with your normal shampoo.  3.  After you shampoo, rinse your hair and body thoroughly to remove the Shampoo.  4.  Use CHG as you would any other liquid soap.  You can apply chg directly  to the skin and wash gently with scrungie or a clean washcloth.  5.  Apply the CHG Soap to your body ONLY FROM THE NECK DOWN.  Do not use on open wounds or open sores.  Avoid contact with your eyes, ears, mouth and genitals (private parts).  Wash genitals (private parts) with your normal soap.  6.  Wash thoroughly, paying special attention to the area where your surgery will be performed.  7.  Thoroughly rinse your body with warm water from the neck down.  8.  DO NOT shower/wash with your normal soap after using and rinsing off the CHG Soap.  9.  Pat yourself dry with  a clean towel.            10.  Wear clean pajamas.            11.  Place clean sheets on your bed the night of your first shower and do not sleep with pets.  Day of Surgery  Do not apply any lotions the morning of surgery.  Please wear clean clothes to the hospital/surgery center.   Please read over the following fact sheets that you were giving:Blood Transfusion, Incentive Spirometry, Preparing For Surgery, Couging and Deep Breathing, PaIn, Surgical Infections.

## 2013-08-25 LAB — URINE CULTURE
CULTURE: NO GROWTH
Colony Count: NO GROWTH

## 2013-08-27 NOTE — Progress Notes (Signed)
Anesthesia chart review: Patient is an 80 year old male scheduled for right TKA by Dr. Ronnie Derby on 09/03/13.  History includes nonsmoker, hypertension, anemia, nephrolithiasis, BPH, chronic DOE, peripheral neuropathy, depression, impaired hearing, gout, arthritis, skin cancer, nonsmoker, left TKA '09. He MI is 33.96 consistent with obesity. Left L3-4 decompression lumbar laminotomy/foraminotomies/microdiscectomy on 06/01/2013. PCP is Dr. Carlus Pavlov with [facility].  EKG on 02/14/13 showed NSR, low voltage QRS.   CXR on 05/30/13 showed no active cardiopulmonary disease.  Preoperative labs noted.  BUN 22/Cr 1.66. His previous Cr on 05/30/13 was 1.54 with range of 1.14 - 1.77 since 03/2008; most often appears to be ~ 1.4.  H/H 11.4/35.3. PT/PTT, urine culture WNL. His Cr appears to be a little above baseline, but not greatly changed since his surgery earlier this year.  I think labs are acceptable to proceed with plans for Dr. Ronnie Derby to follow patient's renal function closely in the post-operative period.  [facility] Center/Anesthesiology Phone [phone] 08/27/2013 3:15 PM

## 2013-09-02 MED ORDER — CEFAZOLIN SODIUM-DEXTROSE 2-3 GM-% IV SOLR: 2.0000 g | INTRAVENOUS | Status: DC

## 2013-09-02 MED ORDER — CHLORHEXIDINE GLUCONATE 4 % EX LIQD
60.0000 mL | Freq: Once | CUTANEOUS | Status: DC
  Filled 2013-09-02: qty 60

## 2013-09-02 MED ORDER — SODIUM CHLORIDE 0.9 % IV SOLN: INTRAVENOUS | Status: DC

## 2013-09-02 MED ORDER — SODIUM CHLORIDE 0.9 % IV SOLN
1000.0000 mg | INTRAVENOUS | Status: AC
  Administered 2013-09-03: 1000 mg via INTRAVENOUS
  Filled 2013-09-02: qty 10

## 2013-09-02 MED ORDER — BUPIVACAINE LIPOSOME 1.3 % IJ SUSP
20.0000 mL | Freq: Once | INTRAMUSCULAR | Status: DC
  Filled 2013-09-02: qty 20

## 2013-09-03 ENCOUNTER — Inpatient Hospital Stay (HOSPITAL_COMMUNITY): Payer: Medicare Other | Admitting: Anesthesiology

## 2013-09-03 ENCOUNTER — Encounter (HOSPITAL_COMMUNITY)
Admission: RE | Disposition: A | Discharge status: Home Health | Payer: Self-pay | Source: Ambulatory Visit | Attending: Orthopedic Surgery

## 2013-09-03 ENCOUNTER — Inpatient Hospital Stay (HOSPITAL_COMMUNITY)
Admission: RE | Admit: 2013-09-03 | Discharge: 2013-09-04 | DRG: 470 | Disposition: A | Discharge status: Home Health | Payer: Medicare Other | Source: Ambulatory Visit | Attending: Orthopedic Surgery | Admitting: Orthopedic Surgery

## 2013-09-03 ENCOUNTER — Encounter (HOSPITAL_COMMUNITY): Payer: Self-pay | Admitting: *Deleted

## 2013-09-03 ENCOUNTER — Encounter (HOSPITAL_COMMUNITY): Payer: Medicare Other | Admitting: Vascular Surgery

## 2013-09-03 ENCOUNTER — Inpatient Hospital Stay (HOSPITAL_COMMUNITY): Payer: Medicare Other

## 2013-09-03 DIAGNOSIS — M109 Gout, unspecified: Secondary | ICD-10-CM | POA: Diagnosis not present

## 2013-09-03 DIAGNOSIS — H269 Unspecified cataract: Secondary | ICD-10-CM | POA: Diagnosis present

## 2013-09-03 DIAGNOSIS — D62 Acute posthemorrhagic anemia: Secondary | ICD-10-CM | POA: Diagnosis not present

## 2013-09-03 DIAGNOSIS — G8918 Other acute postprocedural pain: Secondary | ICD-10-CM | POA: Diagnosis not present

## 2013-09-03 DIAGNOSIS — M1710 Unilateral primary osteoarthritis, unspecified knee: Principal | ICD-10-CM | POA: Diagnosis present

## 2013-09-03 DIAGNOSIS — G609 Hereditary and idiopathic neuropathy, unspecified: Secondary | ICD-10-CM | POA: Diagnosis present

## 2013-09-03 DIAGNOSIS — Z85828 Personal history of other malignant neoplasm of skin: Secondary | ICD-10-CM

## 2013-09-03 DIAGNOSIS — I10 Essential (primary) hypertension: Secondary | ICD-10-CM | POA: Diagnosis not present

## 2013-09-03 DIAGNOSIS — Z96659 Presence of unspecified artificial knee joint: Secondary | ICD-10-CM

## 2013-09-03 DIAGNOSIS — N400 Enlarged prostate without lower urinary tract symptoms: Secondary | ICD-10-CM | POA: Diagnosis present

## 2013-09-03 DIAGNOSIS — M25569 Pain in unspecified knee: Secondary | ICD-10-CM | POA: Diagnosis not present

## 2013-09-03 DIAGNOSIS — H9190 Unspecified hearing loss, unspecified ear: Secondary | ICD-10-CM | POA: Diagnosis present

## 2013-09-03 DIAGNOSIS — M48062 Spinal stenosis, lumbar region with neurogenic claudication: Secondary | ICD-10-CM | POA: Diagnosis present

## 2013-09-03 DIAGNOSIS — G4700 Insomnia, unspecified: Secondary | ICD-10-CM | POA: Diagnosis present

## 2013-09-03 DIAGNOSIS — IMO0002 Reserved for concepts with insufficient information to code with codable children: Secondary | ICD-10-CM | POA: Diagnosis not present

## 2013-09-03 HISTORY — DX: Presence of unspecified artificial knee joint: Z96.659

## 2013-09-03 HISTORY — PX: TOTAL KNEE ARTHROPLASTY: SHX125

## 2013-09-03 LAB — CBC
HCT: 33.4 % — ABNORMAL LOW (ref 39.0–52.0)
Hemoglobin: 10.7 g/dL — ABNORMAL LOW (ref 13.0–17.0)
MCH: 29.3 pg (ref 26.0–34.0)
MCHC: 32 g/dL (ref 30.0–36.0)
MCV: 91.5 fL (ref 78.0–100.0)
Platelets: 174 10*3/uL (ref 150–400)
RBC: 3.65 MIL/uL — ABNORMAL LOW (ref 4.22–5.81)
RDW: 15.5 % (ref 11.5–15.5)
WBC: 13.8 10*3/uL — ABNORMAL HIGH (ref 4.0–10.5)

## 2013-09-03 LAB — CREATININE, SERUM
Creatinine, Ser: 1.52 mg/dL — ABNORMAL HIGH (ref 0.50–1.35)
GFR calc Af Amer: 48 mL/min — ABNORMAL LOW (ref >90)
GFR calc non Af Amer: 42 mL/min — ABNORMAL LOW (ref >90)

## 2013-09-03 SURGERY — ARTHROPLASTY, KNEE, TOTAL
Anesthesia: Regional | Site: Knee | Laterality: Right

## 2013-09-03 MED ORDER — ONDANSETRON HCL 4 MG/2ML IJ SOLN: 4.0000 mg | Freq: Four times a day (QID) | INTRAMUSCULAR | Status: DC | PRN

## 2013-09-03 MED ORDER — LIDOCAINE HCL (CARDIAC) 20 MG/ML IV SOLN
INTRAVENOUS | Status: DC | PRN
  Administered 2013-09-03: 40 mg via INTRAVENOUS

## 2013-09-03 MED ORDER — CEFAZOLIN SODIUM-DEXTROSE 2-3 GM-% IV SOLR
2.0000 g | Freq: Four times a day (QID) | INTRAVENOUS | Status: AC
  Administered 2013-09-03 (×2): 2 g via INTRAVENOUS
  Filled 2013-09-03 (×2): qty 50

## 2013-09-03 MED ORDER — BUPIVACAINE-EPINEPHRINE 0.5% -1:200000 IJ SOLN
INTRAMUSCULAR | Status: DC | PRN
  Administered 2013-09-03: 30 mL

## 2013-09-03 MED ORDER — SODIUM CHLORIDE 0.9 % IV SOLN: INTRAVENOUS | Status: DC

## 2013-09-03 MED ORDER — HYDROMORPHONE HCL PF 1 MG/ML IJ SOLN: 0.2500 mg | INTRAMUSCULAR | Status: DC | PRN

## 2013-09-03 MED ORDER — DIPHENHYDRAMINE HCL 12.5 MG/5ML PO ELIX: 12.5000 mg | ORAL_SOLUTION | ORAL | Status: DC | PRN

## 2013-09-03 MED ORDER — CEFAZOLIN SODIUM-DEXTROSE 2-3 GM-% IV SOLR
2.0000 g | Freq: Once | INTRAVENOUS | Status: AC
  Administered 2013-09-03: 2 g via INTRAVENOUS
  Filled 2013-09-03: qty 50

## 2013-09-03 MED ORDER — LIDOCAINE HCL (CARDIAC) 20 MG/ML IV SOLN
INTRAVENOUS | Status: AC
  Filled 2013-09-03: qty 5

## 2013-09-03 MED ORDER — ONDANSETRON HCL 4 MG/2ML IJ SOLN
INTRAMUSCULAR | Status: AC
  Filled 2013-09-03: qty 2

## 2013-09-03 MED ORDER — METHOCARBAMOL 500 MG PO TABS
500.0000 mg | ORAL_TABLET | Freq: Four times a day (QID) | ORAL | Status: DC | PRN
  Administered 2013-09-03 – 2013-09-04 (×2): 500 mg via ORAL
  Filled 2013-09-03 (×3): qty 1

## 2013-09-03 MED ORDER — POLYSACCHARIDE IRON COMPLEX 150 MG PO CAPS
150.0000 mg | ORAL_CAPSULE | Freq: Every day | ORAL | Status: DC
  Administered 2013-09-04: 150 mg via ORAL
  Filled 2013-09-03 (×2): qty 1

## 2013-09-03 MED ORDER — HYDROMORPHONE HCL PF 1 MG/ML IJ SOLN: 1.0000 mg | INTRAMUSCULAR | Status: DC | PRN

## 2013-09-03 MED ORDER — AMLODIPINE BESYLATE 10 MG PO TABS
10.0000 mg | ORAL_TABLET | Freq: Every morning | ORAL | Status: DC
  Administered 2013-09-04: 10 mg via ORAL
  Filled 2013-09-03: qty 1

## 2013-09-03 MED ORDER — TRIMETHOPRIM 100 MG PO TABS
100.0000 mg | ORAL_TABLET | Freq: Every evening | ORAL | Status: DC
  Administered 2013-09-03 – 2013-09-04 (×2): 100 mg via ORAL
  Filled 2013-09-03 (×2): qty 1

## 2013-09-03 MED ORDER — DOCUSATE SODIUM 100 MG PO CAPS
100.0000 mg | ORAL_CAPSULE | Freq: Two times a day (BID) | ORAL | Status: DC
  Administered 2013-09-03 – 2013-09-04 (×2): 100 mg via ORAL
  Filled 2013-09-03 (×3): qty 1

## 2013-09-03 MED ORDER — GABAPENTIN 600 MG PO TABS
600.0000 mg | ORAL_TABLET | Freq: Every day | ORAL | Status: DC
  Administered 2013-09-04: 600 mg via ORAL
  Filled 2013-09-03: qty 1

## 2013-09-03 MED ORDER — IRBESARTAN 150 MG PO TABS
150.0000 mg | ORAL_TABLET | Freq: Every day | ORAL | Status: DC
  Administered 2013-09-04: 150 mg via ORAL
  Filled 2013-09-03 (×2): qty 1

## 2013-09-03 MED ORDER — ALUM & MAG HYDROXIDE-SIMETH 200-200-20 MG/5ML PO SUSP: 30.0000 mL | ORAL | Status: DC | PRN

## 2013-09-03 MED ORDER — OXYCODONE HCL ER 10 MG PO T12A
10.0000 mg | EXTENDED_RELEASE_TABLET | Freq: Two times a day (BID) | ORAL | Status: DC
Start: 2013-09-03 — End: 2013-09-04
  Administered 2013-09-03 – 2013-09-04 (×3): 10 mg via ORAL
  Filled 2013-09-03 (×3): qty 1

## 2013-09-03 MED ORDER — TAMSULOSIN HCL 0.4 MG PO CAPS
0.4000 mg | ORAL_CAPSULE | Freq: Every day | ORAL | Status: DC
  Administered 2013-09-03 – 2013-09-04 (×2): 0.4 mg via ORAL
  Filled 2013-09-03 (×2): qty 1

## 2013-09-03 MED ORDER — ACETAMINOPHEN 650 MG RE SUPP: 650.0000 mg | Freq: Four times a day (QID) | RECTAL | Status: DC | PRN

## 2013-09-03 MED ORDER — LACTATED RINGERS IV SOLN
INTRAVENOUS | Status: DC | PRN
  Administered 2013-09-03 (×2): via INTRAVENOUS

## 2013-09-03 MED ORDER — CELECOXIB 200 MG PO CAPS
200.0000 mg | ORAL_CAPSULE | Freq: Two times a day (BID) | ORAL | Status: DC
  Administered 2013-09-03 – 2013-09-04 (×3): 200 mg via ORAL
  Filled 2013-09-03 (×4): qty 1

## 2013-09-03 MED ORDER — FENTANYL CITRATE 0.05 MG/ML IJ SOLN
INTRAMUSCULAR | Status: AC
  Administered 2013-09-03: 100 ug
  Filled 2013-09-03: qty 2

## 2013-09-03 MED ORDER — ONDANSETRON HCL 4 MG PO TABS: 4.0000 mg | ORAL_TABLET | Freq: Four times a day (QID) | ORAL | Status: DC | PRN

## 2013-09-03 MED ORDER — FENTANYL CITRATE 0.05 MG/ML IJ SOLN
INTRAMUSCULAR | Status: AC
  Filled 2013-09-03: qty 5

## 2013-09-03 MED ORDER — SODIUM CHLORIDE 0.9 % IR SOLN
Status: DC | PRN
  Administered 2013-09-03 (×2): 1000 mL

## 2013-09-03 MED ORDER — MENTHOL 3 MG MT LOZG: 1.0000 | LOZENGE | OROMUCOSAL | Status: DC | PRN

## 2013-09-03 MED ORDER — SERTRALINE HCL 50 MG PO TABS
50.0000 mg | ORAL_TABLET | Freq: Every day | ORAL | Status: DC
  Administered 2013-09-04: 50 mg via ORAL
  Filled 2013-09-03: qty 1

## 2013-09-03 MED ORDER — SODIUM CHLORIDE 0.9 % IJ SOLN
INTRAMUSCULAR | Status: AC
  Filled 2013-09-03: qty 10

## 2013-09-03 MED ORDER — PROPOFOL 10 MG/ML IV BOLUS
INTRAVENOUS | Status: DC | PRN
  Administered 2013-09-03: 150 mg via INTRAVENOUS

## 2013-09-03 MED ORDER — METHOCARBAMOL 100 MG/ML IJ SOLN
500.0000 mg | Freq: Four times a day (QID) | INTRAVENOUS | Status: DC | PRN
  Filled 2013-09-03: qty 5

## 2013-09-03 MED ORDER — ACETAMINOPHEN 325 MG PO TABS: 650.0000 mg | ORAL_TABLET | Freq: Four times a day (QID) | ORAL | Status: DC | PRN

## 2013-09-03 MED ORDER — SENNOSIDES-DOCUSATE SODIUM 8.6-50 MG PO TABS: 1.0000 | ORAL_TABLET | Freq: Every evening | ORAL | Status: DC | PRN

## 2013-09-03 MED ORDER — HYDROMORPHONE HCL PF 1 MG/ML IJ SOLN
INTRAMUSCULAR | Status: AC
  Filled 2013-09-03: qty 1

## 2013-09-03 MED ORDER — EPHEDRINE SULFATE 50 MG/ML IJ SOLN
INTRAMUSCULAR | Status: DC | PRN
  Administered 2013-09-03 (×6): 5 mg via INTRAVENOUS
  Administered 2013-09-03: 10 mg via INTRAVENOUS

## 2013-09-03 MED ORDER — EPHEDRINE SULFATE 50 MG/ML IJ SOLN
INTRAMUSCULAR | Status: AC
  Filled 2013-09-03: qty 1

## 2013-09-03 MED ORDER — BISACODYL 5 MG PO TBEC: 5.0000 mg | DELAYED_RELEASE_TABLET | Freq: Every day | ORAL | Status: DC | PRN

## 2013-09-03 MED ORDER — FERROUS FUM-IRON POLYSACCH 162-115.2 MG PO CAPS: 1.0000 | ORAL_CAPSULE | Freq: Every day | ORAL | Status: DC

## 2013-09-03 MED ORDER — FLEET ENEMA 7-19 GM/118ML RE ENEM: 1.0000 | ENEMA | Freq: Once | RECTAL | Status: AC | PRN

## 2013-09-03 MED ORDER — ENOXAPARIN SODIUM 30 MG/0.3ML ~~LOC~~ SOLN
30.0000 mg | Freq: Two times a day (BID) | SUBCUTANEOUS | Status: DC
  Administered 2013-09-04: 30 mg via SUBCUTANEOUS
  Filled 2013-09-03 (×3): qty 0.3

## 2013-09-03 MED ORDER — OXYCODONE HCL 5 MG PO TABS
5.0000 mg | ORAL_TABLET | ORAL | Status: DC | PRN
  Administered 2013-09-04 (×3): 10 mg via ORAL
  Filled 2013-09-03 (×3): qty 2

## 2013-09-03 MED ORDER — ONDANSETRON HCL 4 MG/2ML IJ SOLN
INTRAMUSCULAR | Status: DC | PRN
  Administered 2013-09-03: 4 mg via INTRAVENOUS

## 2013-09-03 MED ORDER — CEFAZOLIN SODIUM-DEXTROSE 2-3 GM-% IV SOLR
INTRAVENOUS | Status: AC
  Filled 2013-09-03: qty 50

## 2013-09-03 MED ORDER — ALLOPURINOL 300 MG PO TABS
300.0000 mg | ORAL_TABLET | Freq: Every morning | ORAL | Status: DC
  Administered 2013-09-04: 300 mg via ORAL
  Filled 2013-09-03: qty 1

## 2013-09-03 MED ORDER — PHENYLEPHRINE HCL 10 MG/ML IJ SOLN
INTRAMUSCULAR | Status: DC | PRN
  Administered 2013-09-03 (×2): 40 ug via INTRAVENOUS
  Administered 2013-09-03 (×4): 80 ug via INTRAVENOUS

## 2013-09-03 MED ORDER — PROPOFOL 10 MG/ML IV BOLUS
INTRAVENOUS | Status: AC
  Filled 2013-09-03: qty 20

## 2013-09-03 MED ORDER — METOCLOPRAMIDE HCL 5 MG/ML IJ SOLN: 5.0000 mg | Freq: Three times a day (TID) | INTRAMUSCULAR | Status: DC | PRN

## 2013-09-03 MED ORDER — FENTANYL CITRATE 0.05 MG/ML IJ SOLN
INTRAMUSCULAR | Status: DC | PRN
  Administered 2013-09-03: 100 ug via INTRAVENOUS

## 2013-09-03 MED ORDER — PHENOL 1.4 % MT LIQD: 1.0000 | OROMUCOSAL | Status: DC | PRN

## 2013-09-03 MED ORDER — LACTATED RINGERS IV SOLN
INTRAVENOUS | Status: DC
  Administered 2013-09-03: 08:00:00 via INTRAVENOUS

## 2013-09-03 MED ORDER — METOCLOPRAMIDE HCL 10 MG PO TABS: 5.0000 mg | ORAL_TABLET | Freq: Three times a day (TID) | ORAL | Status: DC | PRN

## 2013-09-03 MED ORDER — BUPIVACAINE LIPOSOME 1.3 % IJ SUSP
INTRAMUSCULAR | Status: DC | PRN
  Administered 2013-09-03: 20 mL

## 2013-09-03 SURGICAL SUPPLY — 58 items
BANDAGE ESMARK 6X9 LF (GAUZE/BANDAGES/DRESSINGS) ×1 IMPLANT
BLADE SAGITTAL 13X1.27X60 (BLADE) ×2 IMPLANT
BLADE SAGITTAL 13X1.27X60MM (BLADE) ×1
BLADE SAW SGTL 83.5X18.5 (BLADE) ×3 IMPLANT
BNDG CMPR 9X6 STRL LF SNTH (GAUZE/BANDAGES/DRESSINGS) ×1
BNDG ESMARK 6X9 LF (GAUZE/BANDAGES/DRESSINGS) ×3
BOWL SMART MIX CTS (DISPOSABLE) ×3 IMPLANT
CAP POR TM CP VIT E LN CER HD ×2 IMPLANT
CEMENT BONE SIMPLEX SPEEDSET (Cement) ×6 IMPLANT
COVER SURGICAL LIGHT HANDLE (MISCELLANEOUS) ×3 IMPLANT
CUFF TOURNIQUET SINGLE 34IN LL (TOURNIQUET CUFF) ×3 IMPLANT
DRAPE C-ARM 42X72 X-RAY (DRAPES) ×2 IMPLANT
DRAPE EXTREMITY T 121X128X90 (DRAPE) ×3 IMPLANT
DRAPE INCISE IOBAN 66X45 STRL (DRAPES) ×6 IMPLANT
DRAPE PROXIMA HALF (DRAPES) ×3 IMPLANT
DRAPE U-SHAPE 47X51 STRL (DRAPES) ×3 IMPLANT
DRSG ADAPTIC 3X8 NADH LF (GAUZE/BANDAGES/DRESSINGS) ×3 IMPLANT
DRSG PAD ABDOMINAL 8X10 ST (GAUZE/BANDAGES/DRESSINGS) ×3 IMPLANT
DURAPREP 26ML APPLICATOR (WOUND CARE) ×6 IMPLANT
ELECT REM PT RETURN 9FT ADLT (ELECTROSURGICAL) ×3
ELECTRODE REM PT RTRN 9FT ADLT (ELECTROSURGICAL) ×1 IMPLANT
EVACUATOR 1/8 PVC DRAIN (DRAIN) ×3 IMPLANT
GLOVE BIOGEL M 7.0 STRL (GLOVE) IMPLANT
GLOVE BIOGEL PI IND STRL 7.5 (GLOVE) IMPLANT
GLOVE BIOGEL PI IND STRL 8.5 (GLOVE) ×2 IMPLANT
GLOVE BIOGEL PI INDICATOR 7.5 (GLOVE)
GLOVE BIOGEL PI INDICATOR 8.5 (GLOVE) ×4
GLOVE SURG ORTHO 8.0 STRL STRW (GLOVE) ×6 IMPLANT
GOWN STRL REUS W/ TWL LRG LVL3 (GOWN DISPOSABLE) ×2 IMPLANT
GOWN STRL REUS W/ TWL XL LVL3 (GOWN DISPOSABLE) ×2 IMPLANT
GOWN STRL REUS W/TWL LRG LVL3 (GOWN DISPOSABLE) ×6
GOWN STRL REUS W/TWL XL LVL3 (GOWN DISPOSABLE) ×6
HANDPIECE INTERPULSE COAX TIP (DISPOSABLE) ×3
HOOD PEEL AWAY FACE SHEILD DIS (HOOD) ×12 IMPLANT
KIT BASIN OR (CUSTOM PROCEDURE TRAY) ×3 IMPLANT
KIT ROOM TURNOVER OR (KITS) ×3 IMPLANT
MANIFOLD NEPTUNE II (INSTRUMENTS) ×3 IMPLANT
NEEDLE 22X1 1/2 (OR ONLY) (NEEDLE) ×6 IMPLANT
NS IRRIG 1000ML POUR BTL (IV SOLUTION) ×3 IMPLANT
PACK TOTAL JOINT (CUSTOM PROCEDURE TRAY) ×3 IMPLANT
PAD ARMBOARD 7.5X6 YLW CONV (MISCELLANEOUS) ×6 IMPLANT
PADDING CAST COTTON 6X4 STRL (CAST SUPPLIES) ×3 IMPLANT
SET HNDPC FAN SPRY TIP SCT (DISPOSABLE) ×1 IMPLANT
SPONGE GAUZE 4X4 12PLY (GAUZE/BANDAGES/DRESSINGS) ×3 IMPLANT
SPONGE GAUZE 4X4 12PLY STER LF (GAUZE/BANDAGES/DRESSINGS) ×2 IMPLANT
STAPLER VISISTAT 35W (STAPLE) ×3 IMPLANT
SUCTION FRAZIER TIP 10 FR DISP (SUCTIONS) ×3 IMPLANT
SUT BONE WAX W31G (SUTURE) ×3 IMPLANT
SUT VIC AB 0 CTB1 27 (SUTURE) ×6 IMPLANT
SUT VIC AB 1 CT1 27 (SUTURE) ×6
SUT VIC AB 1 CT1 27XBRD ANBCTR (SUTURE) ×2 IMPLANT
SUT VIC AB 2-0 CT1 27 (SUTURE) ×6
SUT VIC AB 2-0 CT1 TAPERPNT 27 (SUTURE) ×2 IMPLANT
SYR CONTROL 10ML LL (SYRINGE) ×6 IMPLANT
TOWEL OR 17X24 6PK STRL BLUE (TOWEL DISPOSABLE) ×3 IMPLANT
TOWEL OR 17X26 10 PK STRL BLUE (TOWEL DISPOSABLE) ×3 IMPLANT
TRAY FOLEY CATH 14FR (SET/KITS/TRAYS/PACK) ×3 IMPLANT
WATER STERILE IRR 1000ML POUR (IV SOLUTION) ×6 IMPLANT

## 2013-09-03 NOTE — H&P (Signed)
[name] MRN:  [id number] DOB/SEX:  1931-10-12/male  CHIEF COMPLAINT:  Painful right Knee  HISTORY: Patient is a 80 y.o. male presented with a history of pain in the right knee. Onset of symptoms was gradual starting several years ago with gradually worsening course since that time. Prior procedures on the knee include none. Patient has been treated conservatively with over-the-counter NSAIDs and activity modification. Patient currently rates pain in the knee at 9 out of 10 with activity. There is pain at night.  PAST MEDICAL HISTORY: Patient Active Problem List   Diagnosis Date Noted  . Spinal stenosis, lumbar region, with neurogenic claudication 06/01/2013  . Lumbar stenosis with neurogenic claudication 06/01/2013  . Urinary retention 02/20/2013  . Ileus 02/16/2013  . Spinal stenosis of lumbar region 02/14/2013  . Displaced intertrochanteric fracture of right femur 02/14/2013  . Hypertension    Past Medical History  Diagnosis Date  . Neuropathy   . Enlarged prostate     takes Flomax daily  . Neuromuscular disorder 02/14/13    damnage nerves in bil feet  . Cancer 02/14/13    skin cancer  . Arthritis   . Gout     takes Allopurinol daily  . Insomnia     takes Restoril nightly  . History of UTI     takes Trimethoprim daily  . Hypertension     takes Amlodipine and Avapro daily  . History of bronchitis     last time in Dec 2014  . Numbness     both feet  . Peripheral neuropathy   . Joint pain   . Joint swelling   . History of colon polyps   . Hemorrhoids     occasionally  . History of kidney stones   . Anemia     takes Iron pill daily  . History of blood transfusion     no abnormal reaction noted  . Cataract     right eye  and immature  . Depression     no meds required  . Impaired hearing     wears hearing aids  . Shortness of breath     with exertion  . Constipation   . Insomnia   . History of blood transfusion     lap - colon blockage  . Renal  calculus     large   Past Surgical History  Procedure Laterality Date  . Intramedullary (im) nail intertrochanteric Right 02/15/2013    Procedure: INTRAMEDULLARY (IM) NAIL INTERTROCHANTRIC;  Surgeon: Rozanna Box, MD;  Location: [facility];  Service: Orthopedics;  Laterality: Right;  . Colonoscopy  2010  . Appendectomy  1964  . Tonsillectomy  1970/2006  . Hemorrhoid surgery  1970/2006  . Laparoscopic surgery   2006    d/t blockage in intestine   . Basal cell removed from back and face   2005  . Basal cell removed from cheek and nose  2009  . Basal cell removed from left ear  2014  . Joint replacement Left 09    knee  . Right hip surgery  2014  . Esophagogastroduodenoscopy    . Lumbar laminectomy/decompression microdiscectomy Left 06/01/2013    Procedure: LEFT LUMBAR THREE-FOUR DECOMPRESSION LUMBAR LAMINECTOMY/MICRODISCECTOMY;POSSIBLE LUMBAR FOUR-FIVE;  Surgeon: Charlie Pitter, MD;  Location: [facility];  Service: Neurosurgery;  Laterality: Left;  left     MEDICATIONS:   No prescriptions prior to admission    ALLERGIES:   Allergies  Allergen Reactions  . Adhesive [Tape]  redness  . Ativan [Lorazepam] Other (See Comments)    Hallucinations.  Has an adverse reaction to medication.  Disoriented.  "Mean"    REVIEW OF SYSTEMS:  Pertinent items are noted in HPI.   FAMILY HISTORY:  No family history on file.  SOCIAL HISTORY:   History  Substance Use Topics  . Smoking status: Never Smoker   . Smokeless tobacco: Not on file  . Alcohol Use: No     EXAMINATION:  Vital signs in last 24 hours:    General appearance: alert, cooperative and no distress Lungs: clear to auscultation bilaterally Heart: regular rate and rhythm, S1, S2 normal, no murmur, click, rub or gallop Abdomen: soft, non-tender; bowel sounds normal; no masses,  no organomegaly Extremities: extremities normal, atraumatic, no cyanosis or edema and Homans sign is negative, no sign of DVT Pulses: 2+ and  symmetric Skin: Skin color, texture, turgor normal. No rashes or lesions Neurologic: Alert and oriented X 3, normal strength and tone. Normal symmetric reflexes. Normal coordination and gait  Musculoskeletal:  ROM 0-115, Ligaments intact,  Imaging Review Plain radiographs demonstrate severe degenerative joint disease of the right knee. The overall alignment is significant valgus. The bone quality appears to be good for age and reported activity level.  Assessment/Plan: End stage arthritis, right knee   The patient history, physical examination and imaging studies are consistent with advanced degenerative joint disease of the right knee. The patient has failed conservative treatment.  The clearance notes were reviewed.  After discussion with the patient it was felt that Total Knee Replacement was indicated. The procedure,  risks, and benefits of total knee arthroplasty were presented and reviewed. The risks including but not limited to aseptic loosening, infection, blood clots, vascular injury, stiffness, patella tracking problems complications among others were discussed. The patient acknowledged the explanation, agreed to proceed with the plan.  Carlynn Spry 09/03/2013, 6:41 AM

## 2013-09-03 NOTE — Progress Notes (Signed)
Utilization review completed.  

## 2013-09-03 NOTE — Anesthesia Postprocedure Evaluation (Signed)
  Anesthesia Post-op Note  Patient: [name]  Procedure(s) Performed: Procedure(s): RIGHT TOTAL KNEE ARTHROPLASTY (Right)  Patient Location: PACU  Anesthesia Type:General and block  Level of Consciousness: awake and alert   Airway and Oxygen Therapy: Patient Spontanous Breathing  Post-op Pain: none  Post-op Assessment: Post-op Vital signs reviewed, Patient's Cardiovascular Status Stable and Respiratory Function Stable  Post-op Vital Signs: Reviewed  Filed Vitals:   09/03/13 1225  BP: 119/65  Pulse: 85  Temp:   Resp: 18    Complications: No apparent anesthesia complications

## 2013-09-03 NOTE — Evaluation (Signed)
Physical Therapy Evaluation Patient Details Name: [name] MRN: [id number] DOB: 1932/04/11 Today's Date: 09/03/2013   History of Present Illness  80 y.o. male admitted to [facility] on 09/03/13 for elective R TKA.  He has significant PMHx of L TKA (in '09), R hip IM nail after a fall, and lumbar laminectomy and decompression, HTN, peripherial neuropathy, and anemia.    Clinical Impression  Pt is POD #1 and is moving well with normal post-op pain and weakness.  He will likely progress well enough to go home with HHPT f/u at d/c.   PT to follow acutely for deficits listed below.       Follow Up Recommendations Home health PT;Supervision for mobility/OOB    Equipment Recommendations  None recommended by PT    Recommendations for Other Services   None    Precautions / Restrictions Precautions Precautions: Fall Precaution Comments: h/o falls Restrictions Weight Bearing Restrictions: Yes RLE Weight Bearing: Weight bearing as tolerated      Mobility  Bed Mobility Overal bed mobility: Modified Independent             General bed mobility comments: pt able to get to EOB without assist HOB raised and pt using railing for leverage.   Transfers Overall transfer level: Needs assistance Equipment used: Rolling walker (2 wheeled) Transfers: Sit to/from Stand Sit to Stand: Min assist         General transfer comment: min assist to support trunk over weak right leg during transitions.   Ambulation/Gait Ambulation/Gait assistance: Min assist Ambulation Distance (Feet): 10 Feet Assistive device: Rolling walker (2 wheeled) Gait Pattern/deviations: Step-to pattern;Antalgic Gait velocity: decreased   General Gait Details: pt with buckling right knee during stace phase.  Pt with poor sensation on right leg as well.              Pertinent Vitals/Pain See vitals flow sheet.     Home Living Family/patient expects to be discharged to:: Private residence Living Arrangements:  Spouse/significant other Available Help at Discharge: Family;Available 24 hours/day Type of Home: House Home Access: Stairs to enter Entrance Stairs-Rails: Right;Left Entrance Stairs-Number of Steps: 2 Home Layout: One level;Laundry or work area in [place]: Environmental consultant - 2 wheels;Bedside commode;Shower seat      rior Function Level of Independence: Independent with assistive device(s)         Comments: retired, drives, walks with a walker since last Thanksgiving (2014).      Hand Dominance   Dominant Hand: Right    Extremity/Trunk Assessment   Upper Extremity Assessment: Defer to OT evaluation           Lower Extremity Assessment: RLE deficits/detail RLE Deficits / Details: normal post-op pain and weakness.  Decreased sensation (likely has not worn off from surgery).  ROM ~90 in sitting     Cervical / Trunk Assessment: Other exceptions  Communication   Communication: No difficulties  Cognition Arousal/Alertness: Awake/alert Behavior During Therapy: WFL for tasks assessed/performed Overall Cognitive Status: Within Functional Limits for tasks assessed                         Exercises Total Joint Exercises Ankle Circles/Pumps: AROM;Both;10 reps;Seated;Supine Heel Slides: AROM;Right;5 reps;Supine Long Arc Quad: AROM;Right;5 reps;Seated Goniometric ROM: 90      Assessment/Plan    PT Assessment Patient needs continued PT services  PT Diagnosis Difficulty walking;Abnormality of gait;Generalized weakness;Acute pain   PT Problem List Decreased strength;Decreased activity tolerance;Decreased balance;Decreased mobility;Decreased range of  motion;Decreased knowledge of use of DME;Decreased knowledge of precautions;Pain;Impaired sensation  PT Treatment Interventions DME instruction;Gait training;Stair training;Therapeutic activities;Functional mobility training;Therapeutic exercise;Balance training;Neuromuscular re-education;Cognitive  remediation;Patient/family education;Modalities   PT Goals (Current goals can be found in the Care Plan section) Acute Rehab PT Goals Patient Stated Goal: to go home this time and not go to SNF PT Goal Formulation: With patient/family Time For Goal Achievement: 09/17/13 Potential to Achieve Goals: Good    Frequency 7X/week    End of Session Equipment Utilized During Treatment: Gait belt Activity Tolerance: Other (comment) (limited by R knee weakness/buckling) Patient left: in chair;with call bell/phone within reach;with family/visitor present Nurse Communication: Mobility status         Time: 9371-6967 PT Time Calculation (min): 28 min   Charges:   PT Evaluation $Initial PT Evaluation Tier I: 1 Procedure PT Treatments $Therapeutic Activity: 8-22 mins        Skylinn Vialpando B. [place], [place], DPT [phone]   09/03/2013, 4:54 PM

## 2013-09-03 NOTE — Anesthesia Preprocedure Evaluation (Addendum)
Anesthesia Evaluation  Patient identified by MRN, date of birth, ID band Patient awake    Reviewed: Allergy & Precautions, H&P , NPO status , Patient's Chart, lab work & pertinent test results  Airway Mallampati: II TM Distance: >3 FB Neck ROM: Full    Dental no notable dental hx. (+) Teeth Intact, Dental Advisory Given   Pulmonary neg pulmonary ROS,  breath sounds clear to auscultation  Pulmonary exam normal       Cardiovascular hypertension, On Medications Rhythm:Regular Rate:Normal     Neuro/Psych Depression negative neurological ROS     GI/Hepatic negative GI ROS, Neg liver ROS,   Endo/Other  negative endocrine ROS  Renal/GU Renal disease  negative genitourinary   Musculoskeletal   Abdominal   Peds  Hematology negative hematology ROS (+)   Anesthesia Other Findings   Reproductive/Obstetrics negative OB ROS                         Anesthesia Physical Anesthesia Plan  ASA: II  Anesthesia Plan: General and Regional   Post-op Pain Management:    Induction: Intravenous  Airway Management Planned: LMA  Additional Equipment:   Intra-op Plan:   Post-operative Plan: Extubation in OR  Informed Consent: I have reviewed the patients History and Physical, chart, labs and discussed the procedure including the risks, benefits and alternatives for the proposed anesthesia with the patient or authorized representative who has indicated his/her understanding and acceptance.   Dental advisory given  Plan Discussed with: CRNA  Anesthesia Plan Comments:         Anesthesia Quick Evaluation

## 2013-09-03 NOTE — Plan of Care (Signed)
Problem: Consults Goal: Diagnosis- Total Joint Replacement Primary Total Knee  rIGHT

## 2013-09-03 NOTE — Op Note (Signed)
TOTAL KNEE REPLACEMENT OPERATIVE NOTE:  09/03/2013  1:35 PM  PATIENT:  [name]  80 y.o. male  PRE-OPERATIVE DIAGNOSIS:  osteoarthritis right knee  POST-OPERATIVE DIAGNOSIS:  osteoarthritis right knee  PROCEDURE:  Procedure(s): RIGHT TOTAL KNEE ARTHROPLASTY  SURGEON:  Surgeon(s): Vickey Huger, MD  PHYSICIAN ASSISTANT: Carlynn Spry, [facility]  ANESTHESIA:   general  DRAINS: Hemovac  SPECIMEN: None  COUNTS:  Correct  TOURNIQUET:   Total Tourniquet Time Documented: Thigh (Right) - 56 minutes Total: Thigh (Right) - 56 minutes   DICTATION:  Indication for procedure:    The patient is a 80 y.o. male who has failed conservative treatment for osteoarthritis right knee.  Informed consent was obtained prior to anesthesia. The risks versus benefits of the operation were explain and in a way the patient can, and did, understand.   On the implant demand matching protocol, this patient scored 10.  Therefore, this patient was not receive a polyethylene insert with vitamin E which is a high demand implant.  Description of procedure:     The patient was taken to the operating room and placed under anesthesia.  The patient was positioned in the usual fashion taking care that all body parts were adequately padded and/or protected.  I foley catheter was not placed.  A tourniquet was applied and the leg prepped and draped in the usual sterile fashion.  The extremity was exsanguinated with the esmarch and tourniquet inflated to 350 mmHg.  Pre-operative range of motion was normal.  The knee was in 5 degree of mild varus.  A midline incision approximately 6-7 inches long was made with a #10 blade.  A new blade was used to make a parapatellar arthrotomy going 2-3 cm into the quadriceps tendon, over the patella, and alongside the medial aspect of the patellar tendon.  A synovectomy was then performed with the #10 blade and forceps. I then elevated the deep MCL off the medial tibial metaphysis  subperiosteally around to the semimembranosus attachment.    I everted the patella and used calipers to measure patellar thickness.  I used the reamer to ream down to appropriate thickness to recreate the native thickness.  I then removed excess bone with the rongeur and sagittal saw.  I used the appropriately sized template and drilled the three lug holes.  I then put the trial in place and measured the thickness with the calipers to ensure recreation of the native thickness.  The trial was then removed and the patella subluxed and the knee brought into flexion.  A homan retractor was place to retract and protect the patella and lateral structures.  A Z-retractor was place medially to protect the medial structures.  The extra-medullary alignment system was used to make cut the tibial articular surface perpendicular to the anamotic axis of the tibia and in 3 degrees of posterior slope.  The cut surface and alignment jig was removed.  I then used the intramedullary alignment guide to make a 6 valgus cut on the distal femur.  I then marked out the epicondylar axis on the distal femur.  The posterior condylar axis measured 3 degrees.  I then used the anterior referencing sizer and measured the femur to be a size 8.  The 4-In-1 cutting block was screwed into place in external rotation matching the posterior condylar angle, making our cuts perpendicular to the epicondylar axis.  Anterior, posterior and chamfer cuts were made with the sagittal saw.  The cutting block and cut pieces were removed.  A  lamina spreader was placed in 90 degrees of flexion.  The ACL, PCL, menisci, and posterior condylar osteophytes were removed.  A 11 mm spacer blocked was found to offer good flexion and extension gap balance after minimal in degree releasing.   The scoop retractor was then placed and the femoral finishing block was pinned in place.  The small sagittal saw was used as well as the lug drill to finish the femur.  The block  and cut surfaces were removed and the medullary canal hole filled with autograft bone from the cut pieces.  The tibia was delivered forward in deep flexion and external rotation.  A size F tray was selected and pinned into place centered on the medial 1/3 of the tibial tubercle.  The reamer and keel was used to prepare the tibia through the tray.    I then trialed with the size 8 femur, size F tibia, a 11 mm insert and the 32 patella.  I had excellent flexion/extension gap balance, excellent patella tracking.  Flexion was full and beyond 120 degrees; extension was zero.  These components were chosen and the staff opened them to me on the back table while the knee was lavaged copiously and the cement mixed.  The soft tissue was infiltrated with 60cc of exparel 1.3% through a 21 gauge needle.  I cemented in the components and removed all excess cement.  The polyethylene tibial component was snapped into place and the knee placed in extension while cement was hardening.  The capsule was infilltrated with 30cc of .25% Marcaine with epinephrine.  A hemovac was place in the joint exiting superolaterally.  A pain pump was place superomedially superficial to the arthrotomy.  Once the cement was hard, the tourniquet was let down.  Hemostasis was obtained.  The arthrotomy was closed with figure-8 #1 vicryl sutures.  The deep soft tissues were closed with #0 vicryls and the subcuticular layer closed with a running #2-0 vicryl.  The skin was reapproximated and closed with skin staples.  The wound was dressed with xeroform, 4 x4's, 2 ABD sponges, a single layer of webril and a TED stocking.   The patient was then awakened, extubated, and taken to the recovery room in stable condition.  BLOOD LOSS:  300cc DRAINS: 1 hemovac, 1 pain catheter COMPLICATIONS:  None.  PLAN OF CARE: Admit to inpatient   PATIENT DISPOSITION:  PACU - hemodynamically stable.   Delay start of Pharmacological VTE agent (>24hrs) due to  surgical blood loss or risk of bleeding:  not applicable  Please fax a copy of this op note to my office at [phone] (please only include page 1 and 2 of the Case Information op note)

## 2013-09-03 NOTE — Progress Notes (Signed)
Orthopedic Tech Progress Note Patient Details:  [name] 10/07/1931 [id number] CPM applied to RLE with appropriate settings. OHF applied to bed. Footsie roll provided. CPM Right Knee CPM Right Knee: On Right Knee Flexion (Degrees): 90 Right Knee Extension (Degrees): 0   Asia R Thompson 09/03/2013, 12:27 PM

## 2013-09-03 NOTE — Anesthesia Procedure Notes (Addendum)
Procedure Name: LMA Insertion Date/Time: 09/03/2013 10:12 AM Performed by: Luciana Axe K Pre-anesthesia Checklist: Patient identified, Emergency Drugs available, Suction available, Patient being monitored and Timeout performed Patient Re-evaluated:Patient Re-evaluated prior to inductionOxygen Delivery Method: Circle system utilized Preoxygenation: Pre-oxygenation with 100% oxygen Intubation Type: IV induction Ventilation: Mask ventilation without difficulty LMA: LMA inserted LMA Size: 5.0 Number of attempts: 1 Placement Confirmation: positive ETCO2,  CO2 detector and breath sounds checked- equal and bilateral Tube secured with: Tape Dental Injury: Teeth and Oropharynx as per pre-operative assessment    Anesthesia Regional Block:  Femoral nerve block  Pre-Anesthetic Checklist: ,, timeout performed, Correct Patient, Correct Site, Correct Laterality, Correct Procedure, Correct Position, site marked, Risks and benefits discussed, pre-op evaluation,  At surgeon's request and post-op pain management  Laterality: Right  Prep: Maximum Sterile Barrier Precautions used and chloraprep       Needles:  Injection technique: Single-shot  Needle Type: Echogenic Stimulator Needle     Needle Length: 5cm 5 cm Needle Gauge: 22 and 22 G    Additional Needles:  Procedures: ultrasound guided (picture in chart) Femoral nerve block  Nerve Stimulator or Paresthesia:  Response: Patellar respose,   Additional Responses:   Narrative:  Start time: 09/13/2013 9:30 AM End time: 09/13/2013 9:40 AM Injection made incrementally with aspirations every 5 mL. Anesthesiologist: Rockelle Heuerman,MD  Additional Notes: 2% Lidocaine skin wheel.

## 2013-09-03 NOTE — Transfer of Care (Signed)
Immediate Anesthesia Transfer of Care Note  Patient: [name]  Procedure(s) Performed: Procedure(s): RIGHT TOTAL KNEE ARTHROPLASTY (Right)  Patient Location: PACU  Anesthesia Type:General  Level of Consciousness: awake, oriented and patient cooperative  Airway & Oxygen Therapy: Patient Spontanous Breathing and Patient connected to face mask oxygen  Post-op Assessment: Report given to PACU RN and Post -op Vital signs reviewed and stable  Post vital signs: Reviewed  Complications: No apparent anesthesia complications

## 2013-09-04 ENCOUNTER — Encounter (HOSPITAL_COMMUNITY): Payer: Self-pay | Admitting: Orthopedic Surgery

## 2013-09-04 LAB — CBC
HEMATOCRIT: 31 % — AB (ref 39.0–52.0)
HEMOGLOBIN: 9.8 g/dL — AB (ref 13.0–17.0)
MCH: 28.9 pg (ref 26.0–34.0)
MCHC: 31.6 g/dL (ref 30.0–36.0)
MCV: 91.4 fL (ref 78.0–100.0)
Platelets: 158 10*3/uL (ref 150–400)
RBC: 3.39 MIL/uL — ABNORMAL LOW (ref 4.22–5.81)
RDW: 15.4 % (ref 11.5–15.5)
WBC: 10 10*3/uL (ref 4.0–10.5)

## 2013-09-04 LAB — BASIC METABOLIC PANEL
BUN: 14 mg/dL (ref 6–23)
CO2: 25 mEq/L (ref 19–32)
Calcium: 8.6 mg/dL (ref 8.4–10.5)
Chloride: 103 mEq/L (ref 96–112)
Creatinine, Ser: 1.47 mg/dL — ABNORMAL HIGH (ref 0.50–1.35)
GFR calc Af Amer: 50 mL/min — ABNORMAL LOW (ref >90)
GFR calc non Af Amer: 43 mL/min — ABNORMAL LOW (ref >90)
GLUCOSE: 121 mg/dL — AB (ref 70–99)
Potassium: 5.2 mEq/L (ref 3.7–5.3)
Sodium: 138 mEq/L (ref 137–147)

## 2013-09-04 MED ORDER — TIZANIDINE HCL 2 MG PO TABS: 2.0000 mg | ORAL_TABLET | Freq: Three times a day (TID) | ORAL | Status: DC

## 2013-09-04 MED ORDER — OXYCODONE HCL 5 MG PO TABS: 5.0000 mg | ORAL_TABLET | ORAL | Status: DC | PRN

## 2013-09-04 MED ORDER — ENOXAPARIN SODIUM 40 MG/0.4ML ~~LOC~~ SOLN: 40.0000 mg | SUBCUTANEOUS | Status: DC

## 2013-09-04 NOTE — Progress Notes (Signed)
OT Cancellation Note  Patient Details Name: [name] MRN: [id number] DOB: 05-13-1932   Cancelled Treatment:     Pt is an 80 y/o male s/p R TKA. His chart was reviewed and pt was screened for acute OT needs. Per pt and his wife, they report that he has had previous L TKA, and IM hip nailing, they also report that they have walk in shower and necessary DME. Spouse was assisting pt w/ LB ADL's and transfers prior to this R TKA. They report no acute OT needs at this time, will sign off.  [name] B Kendal Ghazarian 09/04/2013, 12:14 PM

## 2013-09-04 NOTE — Progress Notes (Signed)
Physical Therapy Treatment Patient Details Name: [name] MRN: [id number] DOB: 1931-09-22 Today's Date: 09/04/2013    History of Present Illness 79 y.o. male admitted to [facility] on 09/03/13 for elective R TKA.  He has significant PMHx of L TKA (in '09), R hip IM nail after a fall, and lumbar laminectomy and decompression, HTN, peripherial neuropathy, and anemia.      PT Comments    Pt/wife educated with handout and pt performing demonstration of HEP for R knee strengthening and ROM.  Pt/wife express understanding of HEP for home.   Follow Up Recommendations  Home health PT;Supervision for mobility/OOB     Equipment Recommendations  None recommended by PT    Recommendations for Other Services       Precautions / Restrictions Precautions Precautions: Fall Restrictions Weight Bearing Restrictions: Yes RLE Weight Bearing: Weight bearing as tolerated    Mobility  Bed Mobility Overal bed mobility: Modified Independent                Transfers   Equipment used: Rolling walker (2 wheeled)   Sit to Stand: Supervision         General transfer comment: cues for hand placement  Ambulation/Gait Ambulation/Gait assistance: Supervision Ambulation Distance (Feet): 60 Feet Assistive device: Rolling walker (2 wheeled)       General Gait Details: cues for step through pattern, pt bears most weight through UEs   Stairs Stairs: Yes Stairs assistance: Min assist Stair Management: One rail Right Number of Stairs: 2 (x2) General stair comments: pt/wife educated on and performed stair negotiation at min A level with cues for sequencing and technique.  both report they feel comfortable with stair negotiation at home  Wheelchair Mobility    Modified Rankin (Stroke Patients Only)       Balance                                    Cognition Arousal/Alertness: Awake/alert Behavior During Therapy: WFL for tasks assessed/performed Overall Cognitive Status:  Within Functional Limits for tasks assessed                      Exercises Total Joint Exercises Ankle Circles/Pumps: AROM;Both;10 reps Quad Sets: AROM;Right;10 reps Gluteal Sets: AROM;Both;10 reps Towel Squeeze: AROM;Both;10 reps Heel Slides: Right;10 reps;AAROM Hip ABduction/ADduction: AAROM;Right;10 reps Straight Leg Raises: AAROM;5 reps;Right    General Comments        Pertinent Vitals/Pain Pt c/o R knee pain with flexion, end range extension, eases with ice applied at end of session    Home Living                      Prior Function            PT Goals (current goals can now be found in the care plan section) Progress towards PT goals: Progressing toward goals    Frequency  7X/week    PT Plan Current plan remains appropriate    Co-evaluation             End of Session Equipment Utilized During Treatment: Gait belt Activity Tolerance: Patient tolerated treatment well Patient left: in bed;with call bell/phone within reach;with family/visitor present     Time: 1100-1118 PT Time Calculation (min): 18 min  Charges:  $Gait Training: 8-22 mins $Therapeutic Exercise: 8-22 mins  G Codes:      [name] 2013-09-06, 11:20 AM

## 2013-09-04 NOTE — Progress Notes (Signed)
SPORTS MEDICINE AND JOINT REPLACEMENT  Lara Mulch, MD   Carlynn Spry, PA-C [place], [place], [place]  [address]                             [phone]   PROGRESS NOTE  Subjective:  negative for Chest Pain  negative for Shortness of Breath  negative for Nausea/Vomiting   negative for Calf Pain  negative for Bowel Movement   Tolerating Diet: yes         Patient reports pain as 3 on 0-10 scale.    Objective: Vital signs in last 24 hours:   Patient Vitals for the past 24 hrs:  BP Temp Temp src Pulse Resp SpO2  09/04/13 0952 128/58 mmHg - - - - -  09/04/13 0527 129/50 mmHg 98.5 F (36.9 C) Oral 91 18 99 %  09/04/13 0204 143/71 mmHg 98.2 F (36.8 C) Oral 87 18 98 %  09/03/13 2008 153/76 mmHg 97.6 F (36.4 C) Oral 85 18 98 %  09/03/13 1431 146/74 mmHg 97.5 F (36.4 C) - 81 14 98 %  09/03/13 1415 127/65 mmHg 97.8 F (36.6 C) - 89 13 100 %  09/03/13 1345 135/66 mmHg - - 83 17 100 %    @flow {1959:LAST@   Intake/Output from previous day:   04/20 0701 - 04/21 0700 In: 2890 [P.O.:240; I.V.:2600] Out: 895 [Urine:650; Drains:145]   Intake/Output this shift:   04/21 0701 - 04/21 1900 In: 360 [P.O.:360] Out: -    Intake/Output     04/20 0701 - 04/21 0700 04/21 0701 - 04/22 0700   P.O. 240 360   I.V. (mL/kg) 2600 (24.3)    IV Piggyback 50    Total Intake(mL/kg) 2890 (27) 360 (3.4)   Urine (mL/kg/hr) 650    Drains 145    Blood 100    Total Output 895     Net +1995 +360        Urine Occurrence 1 x       LABORATORY DATA:  Recent Labs  09/03/13 1705 09/04/13 0535  WBC 13.8* 10.0  HGB 10.7* 9.8*  HCT 33.4* 31.0*  PLT 174 158    Recent Labs  09/03/13 1705 09/04/13 0535  NA  --  138  K  --  5.2  CL  --  103  CO2  --  25  BUN  --  14  CREATININE 1.52* 1.47*  GLUCOSE  --  121*  CALCIUM  --  8.6   Lab Results  Component Value Date   INR 1.09 08/24/2013   INR 1.0 03/27/2008    Examination:  General appearance: alert, cooperative and  no distress Extremities: extremities normal, atraumatic, no cyanosis or edema and Homans sign is negative, no sign of DVT  Wound Exam: clean, dry, intact   Drainage:  Scant/small amount Serosanguinous exudate  Motor Exam: EHL and FHL Intact  Sensory Exam: Deep Peroneal normal   Assessment:    1 Day Post-Op  Procedure(s) (LRB): RIGHT TOTAL KNEE ARTHROPLASTY (Right)  ADDITIONAL DIAGNOSIS:  Active Problems:   S/P total knee arthroplasty  Acute Blood Loss Anemia   Plan: Physical Therapy as ordered Weight Bearing as Tolerated (WBAT)  DVT Prophylaxis:  Lovenox  DISCHARGE PLAN: Home  DISCHARGE NEEDS: HHPT, CPM, Walker and 3-in-1 comode seat         Carlynn Spry 09/04/2013, 1:40 PM

## 2013-09-04 NOTE — Progress Notes (Signed)
Physical Therapy Treatment Patient Details Name: [name] MRN: [id number] DOB: [birth year] Today's Date: 09/04/2013    History of Present Illness 80 y.o. male admitted to [facility] on 09/03/13 for elective R TKA.  He has significant PMHx of L TKA (in '09), R hip IM nail after a fall, and lumbar laminectomy and decompression, HTN, peripherial neuropathy, and anemia.      PT Comments    Pt and wife able to perform stair negotiation and feel comfortable with this at home at min A level.  Pt improves with repetition and cues to "tighten" R knee during wt bearing  Follow Up Recommendations  Home health PT;Supervision for mobility/OOB     Equipment Recommendations  None recommended by PT    Recommendations for Other Services       Precautions / Restrictions Precautions Precautions: Fall Restrictions Weight Bearing Restrictions: Yes RLE Weight Bearing: Weight bearing as tolerated    Mobility  Bed Mobility Overal bed mobility: Modified Independent                Transfers   Equipment used: Rolling walker (2 wheeled)   Sit to Stand: Supervision         General transfer comment: cues for hand placement  Ambulation/Gait Ambulation/Gait assistance: Supervision Ambulation Distance (Feet): 60 Feet Assistive device: Rolling walker (2 wheeled)       General Gait Details: cues for step through pattern, pt bears most weight through UEs   Stairs Stairs: Yes Stairs assistance: Min assist Stair Management: One rail Right Number of Stairs: 2 (x2) General stair comments: pt/wife educated on and performed stair negotiation at min A level with cues for sequencing and technique.  both report they feel comfortable with stair negotiation at home  Wheelchair Mobility    Modified Rankin (Stroke Patients Only)       Balance                                    Cognition Arousal/Alertness: Awake/alert Behavior During Therapy: WFL for tasks  assessed/performed Overall Cognitive Status: Within Functional Limits for tasks assessed                      Exercises Total Joint Exercises Ankle Circles/Pumps: AROM;Both;10 reps Quad Sets: AROM;Right;10 reps Heel Slides: Right;10 reps;AAROM    General Comments        Pertinent Vitals/Pain No c/o pain during session, ice applied after session    Home Living                      Prior Function            PT Goals (current goals can now be found in the care plan section) Progress towards PT goals: Progressing toward goals    Frequency       PT Plan Current plan remains appropriate    Co-evaluation             End of Session Equipment Utilized During Treatment: Gait belt Activity Tolerance: Patient tolerated treatment well Patient left: in chair;with call bell/phone within reach;with family/visitor present     Time: 1937-9024 PT Time Calculation (min): 25 min  Charges:  $Gait Training: 8-22 mins $Therapeutic Exercise: 8-22 mins                    G Codes:      [name]  09/04/2013, 8:35 AM

## 2013-09-04 NOTE — Discharge Instructions (Signed)
Diet: As you were doing prior to hospitalization   Activity:  Increase activity slowly as tolerated                  No lifting or driving for 6 weeks  Shower:  May shower without a dressing once there is no drainage from your wound.                 Do NOT wash over the wound.                 Dressing:  You may change your dressing on Wednesday                    Then change the dressing daily with sterile 4"x4"s gauze dressing                     And TED hose for knees.  Weight Bearing:  Weight bearing as tolerated as taught in physical therapy.  Use a                                walker or Crutches as instructed.  To prevent constipation: you may use a stool softener such as -               Colace ( over the counter) 100 mg by mouth twice a day                Drink plenty of fluids ( prune juice may be helpful) and high fiber foods                Miralax ( over the counter) for constipation as needed.    Precautions:  If you experience chest pain or shortness of breath - call 911 immediately               For transfer to the hospital emergency department!!               If you develop a fever greater that 101 F, purulent drainage from wound,                             increased redness or drainage from wound, or calf pain -- Call the office.  Follow- Up Appointment:  Please call for an appointment to be seen on 09/18/13                                              [facility] office:  [phone]            [address] [place], [place] [address]

## 2013-09-04 NOTE — Progress Notes (Signed)
Patient provided with discharge instructions and follow up information. He is going home with HHPT and wife for support.

## 2013-09-04 NOTE — Progress Notes (Signed)
Orthopedic Tech Progress Note Patient Details:  [name] 11-12-1931 [id number]  Patient already on CPM for 1500 rounds  [place] 09/04/2013, 2:36 PM

## 2013-09-05 DIAGNOSIS — Z4789 Encounter for other orthopedic aftercare: Secondary | ICD-10-CM | POA: Diagnosis not present

## 2013-09-05 DIAGNOSIS — I10 Essential (primary) hypertension: Secondary | ICD-10-CM | POA: Diagnosis not present

## 2013-09-05 DIAGNOSIS — M1710 Unilateral primary osteoarthritis, unspecified knee: Secondary | ICD-10-CM | POA: Diagnosis not present

## 2013-09-05 DIAGNOSIS — Z96659 Presence of unspecified artificial knee joint: Secondary | ICD-10-CM | POA: Diagnosis not present

## 2013-09-05 DIAGNOSIS — R262 Difficulty in walking, not elsewhere classified: Secondary | ICD-10-CM | POA: Diagnosis not present

## 2013-09-05 DIAGNOSIS — IMO0001 Reserved for inherently not codable concepts without codable children: Secondary | ICD-10-CM | POA: Diagnosis not present

## 2013-09-07 DIAGNOSIS — F3289 Other specified depressive episodes: Secondary | ICD-10-CM | POA: Diagnosis present

## 2013-09-07 DIAGNOSIS — K219 Gastro-esophageal reflux disease without esophagitis: Secondary | ICD-10-CM | POA: Diagnosis present

## 2013-09-07 DIAGNOSIS — G929 Unspecified toxic encephalopathy: Secondary | ICD-10-CM | POA: Diagnosis not present

## 2013-09-07 DIAGNOSIS — G92 Toxic encephalopathy: Secondary | ICD-10-CM | POA: Diagnosis not present

## 2013-09-07 DIAGNOSIS — R4182 Altered mental status, unspecified: Secondary | ICD-10-CM | POA: Diagnosis not present

## 2013-09-07 DIAGNOSIS — R5383 Other fatigue: Secondary | ICD-10-CM | POA: Diagnosis not present

## 2013-09-07 DIAGNOSIS — E860 Dehydration: Secondary | ICD-10-CM | POA: Diagnosis not present

## 2013-09-07 DIAGNOSIS — R0989 Other specified symptoms and signs involving the circulatory and respiratory systems: Secondary | ICD-10-CM | POA: Diagnosis not present

## 2013-09-07 DIAGNOSIS — M6281 Muscle weakness (generalized): Secondary | ICD-10-CM | POA: Diagnosis not present

## 2013-09-07 DIAGNOSIS — R262 Difficulty in walking, not elsewhere classified: Secondary | ICD-10-CM | POA: Diagnosis not present

## 2013-09-07 DIAGNOSIS — M7989 Other specified soft tissue disorders: Secondary | ICD-10-CM | POA: Diagnosis not present

## 2013-09-07 DIAGNOSIS — IMO0001 Reserved for inherently not codable concepts without codable children: Secondary | ICD-10-CM | POA: Diagnosis not present

## 2013-09-07 DIAGNOSIS — F329 Major depressive disorder, single episode, unspecified: Secondary | ICD-10-CM | POA: Diagnosis not present

## 2013-09-07 DIAGNOSIS — R443 Hallucinations, unspecified: Secondary | ICD-10-CM | POA: Diagnosis not present

## 2013-09-07 DIAGNOSIS — Z96659 Presence of unspecified artificial knee joint: Secondary | ICD-10-CM | POA: Diagnosis not present

## 2013-09-07 DIAGNOSIS — M1990 Unspecified osteoarthritis, unspecified site: Secondary | ICD-10-CM | POA: Diagnosis present

## 2013-09-07 DIAGNOSIS — Z4789 Encounter for other orthopedic aftercare: Secondary | ICD-10-CM | POA: Diagnosis not present

## 2013-09-07 DIAGNOSIS — D638 Anemia in other chronic diseases classified elsewhere: Secondary | ICD-10-CM | POA: Diagnosis not present

## 2013-09-07 DIAGNOSIS — R404 Transient alteration of awareness: Secondary | ICD-10-CM | POA: Diagnosis not present

## 2013-09-07 DIAGNOSIS — Z79899 Other long term (current) drug therapy: Secondary | ICD-10-CM | POA: Diagnosis not present

## 2013-09-07 DIAGNOSIS — Z5189 Encounter for other specified aftercare: Secondary | ICD-10-CM | POA: Diagnosis not present

## 2013-09-07 DIAGNOSIS — I10 Essential (primary) hypertension: Secondary | ICD-10-CM | POA: Diagnosis present

## 2013-09-07 DIAGNOSIS — D649 Anemia, unspecified: Secondary | ICD-10-CM | POA: Diagnosis not present

## 2013-09-07 DIAGNOSIS — M1710 Unilateral primary osteoarthritis, unspecified knee: Secondary | ICD-10-CM | POA: Diagnosis not present

## 2013-09-07 DIAGNOSIS — Z471 Aftercare following joint replacement surgery: Secondary | ICD-10-CM | POA: Diagnosis not present

## 2013-09-07 DIAGNOSIS — N179 Acute kidney failure, unspecified: Secondary | ICD-10-CM | POA: Diagnosis not present

## 2013-09-07 DIAGNOSIS — R5381 Other malaise: Secondary | ICD-10-CM | POA: Diagnosis not present

## 2013-09-10 DIAGNOSIS — G929 Unspecified toxic encephalopathy: Secondary | ICD-10-CM | POA: Diagnosis not present

## 2013-09-10 DIAGNOSIS — I10 Essential (primary) hypertension: Secondary | ICD-10-CM | POA: Diagnosis not present

## 2013-09-10 DIAGNOSIS — N179 Acute kidney failure, unspecified: Secondary | ICD-10-CM | POA: Diagnosis not present

## 2013-09-10 DIAGNOSIS — Z471 Aftercare following joint replacement surgery: Secondary | ICD-10-CM | POA: Diagnosis not present

## 2013-09-10 DIAGNOSIS — M6281 Muscle weakness (generalized): Secondary | ICD-10-CM | POA: Diagnosis not present

## 2013-09-10 DIAGNOSIS — F329 Major depressive disorder, single episode, unspecified: Secondary | ICD-10-CM | POA: Diagnosis not present

## 2013-09-10 DIAGNOSIS — D638 Anemia in other chronic diseases classified elsewhere: Secondary | ICD-10-CM | POA: Diagnosis not present

## 2013-09-10 DIAGNOSIS — F3289 Other specified depressive episodes: Secondary | ICD-10-CM | POA: Diagnosis not present

## 2013-09-10 DIAGNOSIS — D649 Anemia, unspecified: Secondary | ICD-10-CM | POA: Diagnosis not present

## 2013-09-10 DIAGNOSIS — R262 Difficulty in walking, not elsewhere classified: Secondary | ICD-10-CM | POA: Diagnosis not present

## 2013-09-10 DIAGNOSIS — Z5189 Encounter for other specified aftercare: Secondary | ICD-10-CM | POA: Diagnosis not present

## 2013-09-10 DIAGNOSIS — G92 Toxic encephalopathy: Secondary | ICD-10-CM | POA: Diagnosis not present

## 2013-09-10 DIAGNOSIS — R5381 Other malaise: Secondary | ICD-10-CM | POA: Diagnosis not present

## 2013-09-10 DIAGNOSIS — Z96659 Presence of unspecified artificial knee joint: Secondary | ICD-10-CM | POA: Diagnosis not present

## 2013-09-13 MED ORDER — ROPIVACAINE HCL 5 MG/ML IJ SOLN
INTRAMUSCULAR | Status: DC | PRN
  Administered 2013-09-03: 30 mL via PERINEURAL

## 2013-09-13 NOTE — Addendum Note (Signed)
Addendum created 09/13/13 1328 by Arabella Merles, MD   Modules edited: Anesthesia Blocks and Procedures, Anesthesia Medication Administration, Clinical Notes   Clinical Notes:  File: [id number]

## 2013-09-18 DIAGNOSIS — Z96659 Presence of unspecified artificial knee joint: Secondary | ICD-10-CM | POA: Diagnosis not present

## 2013-09-18 DIAGNOSIS — Z471 Aftercare following joint replacement surgery: Secondary | ICD-10-CM | POA: Diagnosis not present

## 2013-09-24 NOTE — Discharge Summary (Signed)
SPORTS MEDICINE & JOINT REPLACEMENT   Lara Mulch, MD   Carlynn Spry, PA-C [place], [place], [place]  [address]                             [phone]  PATIENT ID: [name]        MRN:  [id number]          DOB/AGE: [birth year] / 80 y.o.    DISCHARGE SUMMARY  ADMISSION DATE:    09/03/2013 DISCHARGE DATE:   09/04/2013  ADMISSION DIAGNOSIS: osteoarthritis right knee    DISCHARGE DIAGNOSIS:  osteoarthritis right knee    ADDITIONAL DIAGNOSIS: Active Problems:   S/P total knee arthroplasty  Past Medical History  Diagnosis Date  . Neuropathy   . Enlarged prostate     takes Flomax daily  . Neuromuscular disorder 02/14/13    damnage nerves in bil feet  . Cancer 02/14/13    skin cancer  . Arthritis   . Gout     takes Allopurinol daily  . Insomnia     takes Restoril nightly  . History of UTI     takes Trimethoprim daily  . Hypertension     takes Amlodipine and Avapro daily  . History of bronchitis     last time in Dec 2014  . Numbness     both feet  . Peripheral neuropathy   . Joint pain   . Joint swelling   . History of colon polyps   . Hemorrhoids     occasionally  . History of kidney stones   . Anemia     takes Iron pill daily  . History of blood transfusion     no abnormal reaction noted  . Cataract     right eye  and immature  . Depression     no meds required  . Impaired hearing     wears hearing aids  . Shortness of breath     with exertion  . Constipation   . Insomnia   . History of blood transfusion     lap - colon blockage  . Renal calculus     large    PROCEDURE: Procedure(s): RIGHT TOTAL KNEE ARTHROPLASTY on 09/03/2013  CONSULTS:     HISTORY:  See H&P in chart  HOSPITAL COURSE:  [name] is a 80 y.o. admitted on 09/03/2013 and found to have a diagnosis of osteoarthritis right knee.  After appropriate laboratory studies were obtained  they were taken to the operating room on 09/03/2013 and underwent Procedure(s): RIGHT  TOTAL KNEE ARTHROPLASTY.   They were given perioperative antibiotics:  Anti-infectives   Start     Dose/Rate Route Frequency Ordered Stop   09/03/13 1800  trimethoprim (TRIMPEX) tablet 100 mg  Status:  Discontinued     100 mg Oral Every evening 09/03/13 1431 09/04/13 2047   09/03/13 1600  ceFAZolin (ANCEF) IVPB 2 g/50 mL premix     2 g 100 mL/hr over 30 Minutes Intravenous Every 6 hours 09/03/13 1431 09/03/13 2224   09/03/13 1045  ceFAZolin (ANCEF) IVPB 2 g/50 mL premix     2 g 100 mL/hr over 30 Minutes Intravenous  Once 09/03/13 1034 09/03/13 1005   09/02/13 1139  ceFAZolin (ANCEF) IVPB 2 g/50 mL premix  Status:  Discontinued     2 g 100 mL/hr over 30 Minutes Intravenous On call to O.R. 09/02/13 1139 09/03/13 1431    .  Tolerated the procedure well.  Placed with a foley intraoperatively.  Given Ofirmev at induction and for 48 hours.    POD# 1: Vital signs were stable.  Patient denied Chest pain, shortness of breath, or calf pain.  Patient was started on Lovenox 30 mg subcutaneously twice daily at 8am.  Consults to PT, OT, and care management were made.  The patient was weight bearing as tolerated.  CPM was placed on the operative leg 0-90 degrees for 6-8 hours a day.  Incentive spirometry was taught.  Dressing was changed.  Marcaine pump and hemovac were discontinued.      POD #2, Continued  PT for ambulation and exercise program.  IV saline locked.  O2 discontinued.    The remainder of the hospital course was dedicated to ambulation and strengthening.   The patient was discharged on post op day 1 in  Good condition.  Blood products given:none  DIAGNOSTIC STUDIES: Recent vital signs: No data found.      Recent laboratory studies: No results found for this basename: WBC, HGB, HCT, PLT,  in the last 168 hours No results found for this basename: NA, K, CL, CO2, BUN, CREATININE, GLUCOSE, CALCIUM,  in the last 168 hours Lab Results  Component Value Date   INR 1.09 08/24/2013    INR 1.0 03/27/2008     Recent Radiographic Studies :  Dg C-arm 1-60 Min-no Report  09/03/2013   CLINICAL DATA: Right Total Knee   C-ARM 1-60 MINUTES  Fluoroscopy was utilized by the requesting physician.  No radiographic  interpretation.     DISCHARGE INSTRUCTIONS:   DISCHARGE MEDICATIONS:     Medication List         acetaminophen 500 MG tablet  Commonly known as:  TYLENOL  Take 1,000 mg by mouth every 6 (six) hours as needed for pain.     allopurinol 300 MG tablet  Commonly known as:  ZYLOPRIM  Take 300 mg by mouth every morning.     amLODipine 10 MG tablet  Commonly known as:  NORVASC  Take 10 mg by mouth every morning.     enoxaparin 40 MG/0.4ML injection  Commonly known as:  LOVENOX  Inject 0.4 mLs (40 mg total) into the skin daily.     ferrous fumarate-iron polysaccharide complex 162-115.2 MG Caps  Commonly known as:  TANDEM  Take 1 capsule by mouth daily with breakfast.     gabapentin 600 MG tablet  Commonly known as:  NEURONTIN  Take 600 mg by mouth daily.     ibuprofen 200 MG tablet  Commonly known as:  ADVIL,MOTRIN  Take 400-600 mg by mouth every 6 (six) hours as needed (for pain).     irbesartan 150 MG tablet  Commonly known as:  AVAPRO  Take 150 mg by mouth daily.     multivitamin with minerals Tabs tablet  Take 1 tablet by mouth daily.     oxyCODONE 5 MG immediate release tablet  Commonly known as:  Oxy IR/ROXICODONE  Take 1-2 tablets (5-10 mg total) by mouth every 3 (three) hours as needed for breakthrough pain.     sertraline 50 MG tablet  Commonly known as:  ZOLOFT  Take 50 mg by mouth daily.     tamsulosin 0.4 MG Caps capsule  Commonly known as:  FLOMAX  Take 0.4 mg by mouth daily after supper.     temazepam 15 MG capsule  Commonly known as:  RESTORIL  Take 15 mg by mouth at bedtime as  needed for sleep.     tiZANidine 2 MG tablet  Commonly known as:  ZANAFLEX  Take 1 tablet (2 mg total) by mouth 3 (three) times daily.      trimethoprim 100 MG tablet  Commonly known as:  TRIMPEX  Take 100 mg by mouth every evening. Takes to keep from getting UTI s.     vitamin B-12 500 MCG tablet  Commonly known as:  CYANOCOBALAMIN  Take 500 mcg by mouth daily.     zolpidem 10 MG tablet  Commonly known as:  AMBIEN  Take 10 mg by mouth at bedtime as needed for sleep.        FOLLOW UP VISIT:       Follow-up Information   Follow up with Rudean Haskell, MD. Call on 09/18/2013.   Specialty:  Orthopedic Surgery   Contact information:   [place] [address] [phone]       DISPOSITION: HOME   CONDITION:  [name] 09/24/2013, 6:34 AM

## 2013-09-27 DIAGNOSIS — IMO0001 Reserved for inherently not codable concepts without codable children: Secondary | ICD-10-CM | POA: Diagnosis not present

## 2013-09-27 DIAGNOSIS — M1710 Unilateral primary osteoarthritis, unspecified knee: Secondary | ICD-10-CM | POA: Diagnosis not present

## 2013-09-27 DIAGNOSIS — I10 Essential (primary) hypertension: Secondary | ICD-10-CM | POA: Diagnosis not present

## 2013-09-27 DIAGNOSIS — Z96659 Presence of unspecified artificial knee joint: Secondary | ICD-10-CM | POA: Diagnosis not present

## 2013-09-27 DIAGNOSIS — R262 Difficulty in walking, not elsewhere classified: Secondary | ICD-10-CM | POA: Diagnosis not present

## 2013-09-27 DIAGNOSIS — Z4789 Encounter for other orthopedic aftercare: Secondary | ICD-10-CM | POA: Diagnosis not present

## 2013-10-03 DIAGNOSIS — IMO0001 Reserved for inherently not codable concepts without codable children: Secondary | ICD-10-CM | POA: Diagnosis not present

## 2013-10-03 DIAGNOSIS — Z4789 Encounter for other orthopedic aftercare: Secondary | ICD-10-CM | POA: Diagnosis not present

## 2013-10-03 DIAGNOSIS — Z96659 Presence of unspecified artificial knee joint: Secondary | ICD-10-CM | POA: Diagnosis not present

## 2013-10-03 DIAGNOSIS — I10 Essential (primary) hypertension: Secondary | ICD-10-CM | POA: Diagnosis not present

## 2013-10-03 DIAGNOSIS — M1710 Unilateral primary osteoarthritis, unspecified knee: Secondary | ICD-10-CM | POA: Diagnosis not present

## 2013-10-03 DIAGNOSIS — R262 Difficulty in walking, not elsewhere classified: Secondary | ICD-10-CM | POA: Diagnosis not present

## 2013-10-05 DIAGNOSIS — Z96659 Presence of unspecified artificial knee joint: Secondary | ICD-10-CM | POA: Diagnosis not present

## 2013-10-05 DIAGNOSIS — M1710 Unilateral primary osteoarthritis, unspecified knee: Secondary | ICD-10-CM | POA: Diagnosis not present

## 2013-10-05 DIAGNOSIS — R262 Difficulty in walking, not elsewhere classified: Secondary | ICD-10-CM | POA: Diagnosis not present

## 2013-10-05 DIAGNOSIS — IMO0001 Reserved for inherently not codable concepts without codable children: Secondary | ICD-10-CM | POA: Diagnosis not present

## 2013-10-05 DIAGNOSIS — I10 Essential (primary) hypertension: Secondary | ICD-10-CM | POA: Diagnosis not present

## 2013-10-05 DIAGNOSIS — Z4789 Encounter for other orthopedic aftercare: Secondary | ICD-10-CM | POA: Diagnosis not present

## 2013-10-08 DIAGNOSIS — Z96659 Presence of unspecified artificial knee joint: Secondary | ICD-10-CM | POA: Diagnosis not present

## 2013-10-08 DIAGNOSIS — M1710 Unilateral primary osteoarthritis, unspecified knee: Secondary | ICD-10-CM | POA: Diagnosis not present

## 2013-10-08 DIAGNOSIS — IMO0001 Reserved for inherently not codable concepts without codable children: Secondary | ICD-10-CM | POA: Diagnosis not present

## 2013-10-08 DIAGNOSIS — Z4789 Encounter for other orthopedic aftercare: Secondary | ICD-10-CM | POA: Diagnosis not present

## 2013-10-08 DIAGNOSIS — R262 Difficulty in walking, not elsewhere classified: Secondary | ICD-10-CM | POA: Diagnosis not present

## 2013-10-08 DIAGNOSIS — I10 Essential (primary) hypertension: Secondary | ICD-10-CM | POA: Diagnosis not present

## 2013-10-09 DIAGNOSIS — B351 Tinea unguium: Secondary | ICD-10-CM | POA: Diagnosis not present

## 2013-10-09 DIAGNOSIS — M79609 Pain in unspecified limb: Secondary | ICD-10-CM | POA: Diagnosis not present

## 2013-10-10 DIAGNOSIS — IMO0001 Reserved for inherently not codable concepts without codable children: Secondary | ICD-10-CM | POA: Diagnosis not present

## 2013-10-10 DIAGNOSIS — Z96659 Presence of unspecified artificial knee joint: Secondary | ICD-10-CM | POA: Diagnosis not present

## 2013-10-10 DIAGNOSIS — M1710 Unilateral primary osteoarthritis, unspecified knee: Secondary | ICD-10-CM | POA: Diagnosis not present

## 2013-10-10 DIAGNOSIS — Z4789 Encounter for other orthopedic aftercare: Secondary | ICD-10-CM | POA: Diagnosis not present

## 2013-10-10 DIAGNOSIS — R262 Difficulty in walking, not elsewhere classified: Secondary | ICD-10-CM | POA: Diagnosis not present

## 2013-10-10 DIAGNOSIS — I10 Essential (primary) hypertension: Secondary | ICD-10-CM | POA: Diagnosis not present

## 2013-10-11 DIAGNOSIS — F329 Major depressive disorder, single episode, unspecified: Secondary | ICD-10-CM | POA: Diagnosis not present

## 2013-10-11 DIAGNOSIS — N183 Chronic kidney disease, stage 3 unspecified: Secondary | ICD-10-CM | POA: Diagnosis not present

## 2013-10-11 DIAGNOSIS — I10 Essential (primary) hypertension: Secondary | ICD-10-CM | POA: Diagnosis not present

## 2013-10-11 DIAGNOSIS — F3289 Other specified depressive episodes: Secondary | ICD-10-CM | POA: Diagnosis not present

## 2013-10-18 DIAGNOSIS — L821 Other seborrheic keratosis: Secondary | ICD-10-CM | POA: Diagnosis not present

## 2013-10-18 DIAGNOSIS — R233 Spontaneous ecchymoses: Secondary | ICD-10-CM | POA: Diagnosis not present

## 2013-10-18 DIAGNOSIS — C44319 Basal cell carcinoma of skin of other parts of face: Secondary | ICD-10-CM | POA: Diagnosis not present

## 2013-10-18 DIAGNOSIS — C4442 Squamous cell carcinoma of skin of scalp and neck: Secondary | ICD-10-CM | POA: Diagnosis not present

## 2013-10-18 DIAGNOSIS — L570 Actinic keratosis: Secondary | ICD-10-CM | POA: Diagnosis not present

## 2013-10-19 DIAGNOSIS — M25569 Pain in unspecified knee: Secondary | ICD-10-CM | POA: Diagnosis not present

## 2013-10-23 DIAGNOSIS — M25569 Pain in unspecified knee: Secondary | ICD-10-CM | POA: Diagnosis not present

## 2013-10-25 DIAGNOSIS — C4442 Squamous cell carcinoma of skin of scalp and neck: Secondary | ICD-10-CM | POA: Diagnosis not present

## 2013-10-25 DIAGNOSIS — M25569 Pain in unspecified knee: Secondary | ICD-10-CM | POA: Diagnosis not present

## 2013-10-30 DIAGNOSIS — M25569 Pain in unspecified knee: Secondary | ICD-10-CM | POA: Diagnosis not present

## 2013-11-01 DIAGNOSIS — M25569 Pain in unspecified knee: Secondary | ICD-10-CM | POA: Diagnosis not present

## 2013-11-06 DIAGNOSIS — M25569 Pain in unspecified knee: Secondary | ICD-10-CM | POA: Diagnosis not present

## 2013-11-08 DIAGNOSIS — M25569 Pain in unspecified knee: Secondary | ICD-10-CM | POA: Diagnosis not present

## 2013-11-12 DIAGNOSIS — D649 Anemia, unspecified: Secondary | ICD-10-CM | POA: Diagnosis not present

## 2013-11-13 DIAGNOSIS — H1030 Unspecified acute conjunctivitis, unspecified eye: Secondary | ICD-10-CM | POA: Diagnosis not present

## 2013-11-13 DIAGNOSIS — M25569 Pain in unspecified knee: Secondary | ICD-10-CM | POA: Diagnosis not present

## 2013-11-16 DIAGNOSIS — H1030 Unspecified acute conjunctivitis, unspecified eye: Secondary | ICD-10-CM | POA: Diagnosis not present

## 2013-11-19 DIAGNOSIS — H1030 Unspecified acute conjunctivitis, unspecified eye: Secondary | ICD-10-CM | POA: Diagnosis not present

## 2013-11-23 DIAGNOSIS — H1030 Unspecified acute conjunctivitis, unspecified eye: Secondary | ICD-10-CM | POA: Diagnosis not present

## 2013-11-26 DIAGNOSIS — M25569 Pain in unspecified knee: Secondary | ICD-10-CM | POA: Diagnosis not present

## 2013-11-29 DIAGNOSIS — M25569 Pain in unspecified knee: Secondary | ICD-10-CM | POA: Diagnosis not present

## 2013-12-04 DIAGNOSIS — M25569 Pain in unspecified knee: Secondary | ICD-10-CM | POA: Diagnosis not present

## 2013-12-06 DIAGNOSIS — M25569 Pain in unspecified knee: Secondary | ICD-10-CM | POA: Diagnosis not present

## 2013-12-12 DIAGNOSIS — B351 Tinea unguium: Secondary | ICD-10-CM | POA: Diagnosis not present

## 2013-12-12 DIAGNOSIS — M79609 Pain in unspecified limb: Secondary | ICD-10-CM | POA: Diagnosis not present

## 2013-12-17 DIAGNOSIS — N529 Male erectile dysfunction, unspecified: Secondary | ICD-10-CM | POA: Diagnosis not present

## 2013-12-17 DIAGNOSIS — N401 Enlarged prostate with lower urinary tract symptoms: Secondary | ICD-10-CM | POA: Diagnosis not present

## 2014-01-01 DIAGNOSIS — M549 Dorsalgia, unspecified: Secondary | ICD-10-CM | POA: Diagnosis not present

## 2014-01-11 DIAGNOSIS — R071 Chest pain on breathing: Secondary | ICD-10-CM | POA: Diagnosis not present

## 2014-01-11 DIAGNOSIS — I10 Essential (primary) hypertension: Secondary | ICD-10-CM | POA: Diagnosis not present

## 2014-01-23 DIAGNOSIS — S7223XA Displaced subtrochanteric fracture of unspecified femur, initial encounter for closed fracture: Secondary | ICD-10-CM | POA: Diagnosis not present

## 2014-01-23 DIAGNOSIS — M76899 Other specified enthesopathies of unspecified lower limb, excluding foot: Secondary | ICD-10-CM | POA: Diagnosis not present

## 2014-01-23 DIAGNOSIS — IMO0001 Reserved for inherently not codable concepts without codable children: Secondary | ICD-10-CM | POA: Diagnosis not present

## 2014-01-23 DIAGNOSIS — T847XXA Infection and inflammatory reaction due to other internal orthopedic prosthetic devices, implants and grafts, initial encounter: Secondary | ICD-10-CM | POA: Diagnosis not present

## 2014-01-31 DIAGNOSIS — M47817 Spondylosis without myelopathy or radiculopathy, lumbosacral region: Secondary | ICD-10-CM | POA: Diagnosis not present

## 2014-02-08 DIAGNOSIS — I10 Essential (primary) hypertension: Secondary | ICD-10-CM | POA: Diagnosis not present

## 2014-02-08 DIAGNOSIS — E7800 Pure hypercholesterolemia, unspecified: Secondary | ICD-10-CM | POA: Diagnosis not present

## 2014-02-08 DIAGNOSIS — G479 Sleep disorder, unspecified: Secondary | ICD-10-CM | POA: Diagnosis not present

## 2014-02-08 DIAGNOSIS — N183 Chronic kidney disease, stage 3 unspecified: Secondary | ICD-10-CM | POA: Diagnosis not present

## 2014-02-13 DIAGNOSIS — D2320 Other benign neoplasm of skin of unspecified ear and external auricular canal: Secondary | ICD-10-CM | POA: Diagnosis not present

## 2014-02-13 DIAGNOSIS — Z23 Encounter for immunization: Secondary | ICD-10-CM | POA: Diagnosis not present

## 2014-02-13 DIAGNOSIS — C4442 Squamous cell carcinoma of skin of scalp and neck: Secondary | ICD-10-CM | POA: Diagnosis not present

## 2014-02-14 DIAGNOSIS — M79671 Pain in right foot: Secondary | ICD-10-CM | POA: Diagnosis not present

## 2014-02-14 DIAGNOSIS — B351 Tinea unguium: Secondary | ICD-10-CM | POA: Diagnosis not present

## 2014-02-18 DIAGNOSIS — R0602 Shortness of breath: Secondary | ICD-10-CM | POA: Diagnosis not present

## 2014-02-18 DIAGNOSIS — R1013 Epigastric pain: Secondary | ICD-10-CM | POA: Diagnosis not present

## 2014-02-18 DIAGNOSIS — F419 Anxiety disorder, unspecified: Secondary | ICD-10-CM | POA: Diagnosis not present

## 2014-02-18 DIAGNOSIS — I10 Essential (primary) hypertension: Secondary | ICD-10-CM | POA: Diagnosis not present

## 2014-02-18 DIAGNOSIS — R202 Paresthesia of skin: Secondary | ICD-10-CM | POA: Diagnosis not present

## 2014-02-18 DIAGNOSIS — R531 Weakness: Secondary | ICD-10-CM | POA: Diagnosis not present

## 2014-02-18 DIAGNOSIS — K219 Gastro-esophageal reflux disease without esophagitis: Secondary | ICD-10-CM | POA: Diagnosis not present

## 2014-02-18 DIAGNOSIS — F329 Major depressive disorder, single episode, unspecified: Secondary | ICD-10-CM | POA: Diagnosis not present

## 2014-02-18 DIAGNOSIS — Z79899 Other long term (current) drug therapy: Secondary | ICD-10-CM | POA: Diagnosis not present

## 2014-02-18 DIAGNOSIS — R42 Dizziness and giddiness: Secondary | ICD-10-CM | POA: Diagnosis not present

## 2014-02-19 DIAGNOSIS — R1013 Epigastric pain: Secondary | ICD-10-CM | POA: Diagnosis not present

## 2014-02-21 DIAGNOSIS — M4726 Other spondylosis with radiculopathy, lumbar region: Secondary | ICD-10-CM | POA: Diagnosis not present

## 2014-02-21 DIAGNOSIS — M47816 Spondylosis without myelopathy or radiculopathy, lumbar region: Secondary | ICD-10-CM | POA: Diagnosis not present

## 2014-02-21 DIAGNOSIS — I10 Essential (primary) hypertension: Secondary | ICD-10-CM | POA: Diagnosis not present

## 2014-02-21 DIAGNOSIS — M4727 Other spondylosis with radiculopathy, lumbosacral region: Secondary | ICD-10-CM | POA: Diagnosis not present

## 2014-02-21 DIAGNOSIS — M47817 Spondylosis without myelopathy or radiculopathy, lumbosacral region: Secondary | ICD-10-CM | POA: Diagnosis not present

## 2014-02-21 DIAGNOSIS — M4806 Spinal stenosis, lumbar region: Secondary | ICD-10-CM | POA: Diagnosis not present

## 2014-03-30 DIAGNOSIS — S92912A Unspecified fracture of left toe(s), initial encounter for closed fracture: Secondary | ICD-10-CM | POA: Diagnosis not present

## 2014-04-23 DIAGNOSIS — B351 Tinea unguium: Secondary | ICD-10-CM | POA: Diagnosis not present

## 2014-04-23 DIAGNOSIS — M79674 Pain in right toe(s): Secondary | ICD-10-CM | POA: Diagnosis not present

## 2014-04-23 DIAGNOSIS — M79675 Pain in left toe(s): Secondary | ICD-10-CM | POA: Diagnosis not present

## 2014-05-14 DIAGNOSIS — J209 Acute bronchitis, unspecified: Secondary | ICD-10-CM | POA: Diagnosis not present

## 2014-05-29 DIAGNOSIS — Z6832 Body mass index (BMI) 32.0-32.9, adult: Secondary | ICD-10-CM | POA: Diagnosis not present

## 2014-05-29 DIAGNOSIS — M4726 Other spondylosis with radiculopathy, lumbar region: Secondary | ICD-10-CM | POA: Diagnosis not present

## 2014-06-18 DIAGNOSIS — H25811 Combined forms of age-related cataract, right eye: Secondary | ICD-10-CM | POA: Diagnosis not present

## 2014-06-19 DIAGNOSIS — N401 Enlarged prostate with lower urinary tract symptoms: Secondary | ICD-10-CM | POA: Diagnosis not present

## 2014-06-19 DIAGNOSIS — N529 Male erectile dysfunction, unspecified: Secondary | ICD-10-CM | POA: Diagnosis not present

## 2014-06-25 DIAGNOSIS — M79674 Pain in right toe(s): Secondary | ICD-10-CM | POA: Diagnosis not present

## 2014-06-25 DIAGNOSIS — B351 Tinea unguium: Secondary | ICD-10-CM | POA: Diagnosis not present

## 2014-06-25 DIAGNOSIS — M79675 Pain in left toe(s): Secondary | ICD-10-CM | POA: Diagnosis not present

## 2014-07-24 DIAGNOSIS — L300 Nummular dermatitis: Secondary | ICD-10-CM | POA: Diagnosis not present

## 2014-07-24 DIAGNOSIS — L570 Actinic keratosis: Secondary | ICD-10-CM | POA: Diagnosis not present

## 2014-07-24 DIAGNOSIS — C44219 Basal cell carcinoma of skin of left ear and external auricular canal: Secondary | ICD-10-CM | POA: Diagnosis not present

## 2014-07-29 DIAGNOSIS — Z1389 Encounter for screening for other disorder: Secondary | ICD-10-CM | POA: Diagnosis not present

## 2014-07-29 DIAGNOSIS — Z0000 Encounter for general adult medical examination without abnormal findings: Secondary | ICD-10-CM | POA: Diagnosis not present

## 2014-07-29 DIAGNOSIS — I10 Essential (primary) hypertension: Secondary | ICD-10-CM | POA: Diagnosis not present

## 2014-07-29 DIAGNOSIS — M545 Low back pain: Secondary | ICD-10-CM | POA: Diagnosis not present

## 2014-07-29 DIAGNOSIS — Z9181 History of falling: Secondary | ICD-10-CM | POA: Diagnosis not present

## 2014-08-06 DIAGNOSIS — M109 Gout, unspecified: Secondary | ICD-10-CM | POA: Diagnosis not present

## 2014-08-06 DIAGNOSIS — C4490 Unspecified malignant neoplasm of skin, unspecified: Secondary | ICD-10-CM | POA: Diagnosis not present

## 2014-08-06 DIAGNOSIS — G629 Polyneuropathy, unspecified: Secondary | ICD-10-CM | POA: Diagnosis not present

## 2014-08-06 DIAGNOSIS — E669 Obesity, unspecified: Secondary | ICD-10-CM | POA: Diagnosis not present

## 2014-08-06 DIAGNOSIS — Z6832 Body mass index (BMI) 32.0-32.9, adult: Secondary | ICD-10-CM | POA: Diagnosis not present

## 2014-08-06 DIAGNOSIS — H25811 Combined forms of age-related cataract, right eye: Secondary | ICD-10-CM | POA: Diagnosis not present

## 2014-08-06 DIAGNOSIS — Z96653 Presence of artificial knee joint, bilateral: Secondary | ICD-10-CM | POA: Diagnosis not present

## 2014-08-06 DIAGNOSIS — N400 Enlarged prostate without lower urinary tract symptoms: Secondary | ICD-10-CM | POA: Diagnosis not present

## 2014-08-06 DIAGNOSIS — I10 Essential (primary) hypertension: Secondary | ICD-10-CM | POA: Diagnosis not present

## 2014-08-06 DIAGNOSIS — F329 Major depressive disorder, single episode, unspecified: Secondary | ICD-10-CM | POA: Diagnosis not present

## 2014-08-06 DIAGNOSIS — H269 Unspecified cataract: Secondary | ICD-10-CM | POA: Diagnosis not present

## 2014-08-06 DIAGNOSIS — K219 Gastro-esophageal reflux disease without esophagitis: Secondary | ICD-10-CM | POA: Diagnosis not present

## 2014-08-12 DIAGNOSIS — M25552 Pain in left hip: Secondary | ICD-10-CM | POA: Diagnosis not present

## 2014-08-19 DIAGNOSIS — Z471 Aftercare following joint replacement surgery: Secondary | ICD-10-CM | POA: Diagnosis not present

## 2014-08-19 DIAGNOSIS — Z96653 Presence of artificial knee joint, bilateral: Secondary | ICD-10-CM | POA: Diagnosis not present

## 2014-08-21 DIAGNOSIS — R1032 Left lower quadrant pain: Secondary | ICD-10-CM | POA: Diagnosis not present

## 2014-08-21 DIAGNOSIS — D649 Anemia, unspecified: Secondary | ICD-10-CM | POA: Diagnosis not present

## 2014-08-22 DIAGNOSIS — K921 Melena: Secondary | ICD-10-CM | POA: Diagnosis not present

## 2014-08-22 DIAGNOSIS — D509 Iron deficiency anemia, unspecified: Secondary | ICD-10-CM | POA: Diagnosis not present

## 2014-08-22 DIAGNOSIS — D649 Anemia, unspecified: Secondary | ICD-10-CM | POA: Diagnosis not present

## 2014-08-26 DIAGNOSIS — D2361 Other benign neoplasm of skin of right upper limb, including shoulder: Secondary | ICD-10-CM | POA: Diagnosis not present

## 2014-08-26 DIAGNOSIS — B358 Other dermatophytoses: Secondary | ICD-10-CM | POA: Diagnosis not present

## 2014-08-27 DIAGNOSIS — B351 Tinea unguium: Secondary | ICD-10-CM | POA: Diagnosis not present

## 2014-08-27 DIAGNOSIS — M79675 Pain in left toe(s): Secondary | ICD-10-CM | POA: Diagnosis not present

## 2014-08-27 DIAGNOSIS — M79674 Pain in right toe(s): Secondary | ICD-10-CM | POA: Diagnosis not present

## 2014-09-10 DIAGNOSIS — Z79899 Other long term (current) drug therapy: Secondary | ICD-10-CM | POA: Diagnosis not present

## 2014-09-10 DIAGNOSIS — Z8711 Personal history of peptic ulcer disease: Secondary | ICD-10-CM | POA: Diagnosis not present

## 2014-09-10 DIAGNOSIS — K219 Gastro-esophageal reflux disease without esophagitis: Secondary | ICD-10-CM | POA: Diagnosis not present

## 2014-09-10 DIAGNOSIS — H259 Unspecified age-related cataract: Secondary | ICD-10-CM | POA: Diagnosis not present

## 2014-09-10 DIAGNOSIS — I10 Essential (primary) hypertension: Secondary | ICD-10-CM | POA: Diagnosis not present

## 2014-09-10 DIAGNOSIS — G629 Polyneuropathy, unspecified: Secondary | ICD-10-CM | POA: Diagnosis not present

## 2014-09-10 DIAGNOSIS — H25812 Combined forms of age-related cataract, left eye: Secondary | ICD-10-CM | POA: Diagnosis not present

## 2014-09-10 DIAGNOSIS — H2512 Age-related nuclear cataract, left eye: Secondary | ICD-10-CM | POA: Diagnosis not present

## 2014-09-23 DIAGNOSIS — K5730 Diverticulosis of large intestine without perforation or abscess without bleeding: Secondary | ICD-10-CM | POA: Diagnosis not present

## 2014-09-23 DIAGNOSIS — D126 Benign neoplasm of colon, unspecified: Secondary | ICD-10-CM | POA: Diagnosis not present

## 2014-09-23 DIAGNOSIS — K648 Other hemorrhoids: Secondary | ICD-10-CM | POA: Diagnosis not present

## 2014-10-29 DIAGNOSIS — M79674 Pain in right toe(s): Secondary | ICD-10-CM | POA: Diagnosis not present

## 2014-10-29 DIAGNOSIS — M79675 Pain in left toe(s): Secondary | ICD-10-CM | POA: Diagnosis not present

## 2014-10-29 DIAGNOSIS — B351 Tinea unguium: Secondary | ICD-10-CM | POA: Diagnosis not present

## 2014-12-02 DIAGNOSIS — M109 Gout, unspecified: Secondary | ICD-10-CM | POA: Diagnosis not present

## 2014-12-02 DIAGNOSIS — Z79899 Other long term (current) drug therapy: Secondary | ICD-10-CM | POA: Diagnosis not present

## 2014-12-02 DIAGNOSIS — E780 Pure hypercholesterolemia: Secondary | ICD-10-CM | POA: Diagnosis not present

## 2014-12-18 DIAGNOSIS — Z125 Encounter for screening for malignant neoplasm of prostate: Secondary | ICD-10-CM | POA: Diagnosis not present

## 2014-12-18 DIAGNOSIS — N401 Enlarged prostate with lower urinary tract symptoms: Secondary | ICD-10-CM | POA: Diagnosis not present

## 2015-01-01 DIAGNOSIS — M79675 Pain in left toe(s): Secondary | ICD-10-CM | POA: Diagnosis not present

## 2015-01-01 DIAGNOSIS — B351 Tinea unguium: Secondary | ICD-10-CM | POA: Diagnosis not present

## 2015-01-01 DIAGNOSIS — M79674 Pain in right toe(s): Secondary | ICD-10-CM | POA: Diagnosis not present

## 2015-01-23 DIAGNOSIS — Z23 Encounter for immunization: Secondary | ICD-10-CM | POA: Diagnosis not present

## 2015-02-10 DIAGNOSIS — C44319 Basal cell carcinoma of skin of other parts of face: Secondary | ICD-10-CM | POA: Diagnosis not present

## 2015-02-13 DIAGNOSIS — C44319 Basal cell carcinoma of skin of other parts of face: Secondary | ICD-10-CM | POA: Diagnosis not present

## 2015-02-24 DIAGNOSIS — S62337A Displaced fracture of neck of fifth metacarpal bone, left hand, initial encounter for closed fracture: Secondary | ICD-10-CM | POA: Diagnosis not present

## 2015-02-24 DIAGNOSIS — S62397A Other fracture of fifth metacarpal bone, left hand, initial encounter for closed fracture: Secondary | ICD-10-CM | POA: Diagnosis not present

## 2015-02-24 DIAGNOSIS — M25511 Pain in right shoulder: Secondary | ICD-10-CM

## 2015-02-24 DIAGNOSIS — M79642 Pain in left hand: Secondary | ICD-10-CM | POA: Insufficient documentation

## 2015-02-24 DIAGNOSIS — M19011 Primary osteoarthritis, right shoulder: Secondary | ICD-10-CM | POA: Diagnosis not present

## 2015-02-24 HISTORY — DX: Pain in left hand: M79.642

## 2015-02-24 HISTORY — DX: Displaced fracture of neck of fifth metacarpal bone, left hand, initial encounter for closed fracture: S62.337A

## 2015-02-24 HISTORY — DX: Pain in right shoulder: M25.511

## 2015-03-10 DIAGNOSIS — S62337D Displaced fracture of neck of fifth metacarpal bone, left hand, subsequent encounter for fracture with routine healing: Secondary | ICD-10-CM | POA: Diagnosis not present

## 2015-03-12 DIAGNOSIS — M79674 Pain in right toe(s): Secondary | ICD-10-CM | POA: Diagnosis not present

## 2015-03-12 DIAGNOSIS — B351 Tinea unguium: Secondary | ICD-10-CM | POA: Diagnosis not present

## 2015-03-12 DIAGNOSIS — M79675 Pain in left toe(s): Secondary | ICD-10-CM | POA: Diagnosis not present

## 2015-03-31 DIAGNOSIS — M79642 Pain in left hand: Secondary | ICD-10-CM | POA: Diagnosis not present

## 2015-03-31 DIAGNOSIS — S62337D Displaced fracture of neck of fifth metacarpal bone, left hand, subsequent encounter for fracture with routine healing: Secondary | ICD-10-CM | POA: Diagnosis not present

## 2015-04-07 DIAGNOSIS — M5412 Radiculopathy, cervical region: Secondary | ICD-10-CM | POA: Diagnosis not present

## 2015-04-21 DIAGNOSIS — S62337D Displaced fracture of neck of fifth metacarpal bone, left hand, subsequent encounter for fracture with routine healing: Secondary | ICD-10-CM | POA: Diagnosis not present

## 2015-04-21 DIAGNOSIS — Z8781 Personal history of (healed) traumatic fracture: Secondary | ICD-10-CM | POA: Diagnosis not present

## 2015-04-23 DIAGNOSIS — J209 Acute bronchitis, unspecified: Secondary | ICD-10-CM | POA: Diagnosis not present

## 2015-05-01 DIAGNOSIS — H26491 Other secondary cataract, right eye: Secondary | ICD-10-CM | POA: Diagnosis not present

## 2015-05-26 DIAGNOSIS — M79675 Pain in left toe(s): Secondary | ICD-10-CM | POA: Diagnosis not present

## 2015-05-26 DIAGNOSIS — B351 Tinea unguium: Secondary | ICD-10-CM | POA: Diagnosis not present

## 2015-05-26 DIAGNOSIS — M79674 Pain in right toe(s): Secondary | ICD-10-CM | POA: Diagnosis not present

## 2015-05-30 DIAGNOSIS — H26491 Other secondary cataract, right eye: Secondary | ICD-10-CM | POA: Diagnosis not present

## 2015-06-11 DIAGNOSIS — Z6834 Body mass index (BMI) 34.0-34.9, adult: Secondary | ICD-10-CM | POA: Diagnosis not present

## 2015-06-11 DIAGNOSIS — M4726 Other spondylosis with radiculopathy, lumbar region: Secondary | ICD-10-CM | POA: Diagnosis not present

## 2015-06-20 DIAGNOSIS — N401 Enlarged prostate with lower urinary tract symptoms: Secondary | ICD-10-CM | POA: Diagnosis not present

## 2015-06-20 DIAGNOSIS — N529 Male erectile dysfunction, unspecified: Secondary | ICD-10-CM | POA: Diagnosis not present

## 2015-06-26 DIAGNOSIS — M4726 Other spondylosis with radiculopathy, lumbar region: Secondary | ICD-10-CM | POA: Diagnosis not present

## 2015-07-23 DIAGNOSIS — L570 Actinic keratosis: Secondary | ICD-10-CM | POA: Diagnosis not present

## 2015-07-23 DIAGNOSIS — C44219 Basal cell carcinoma of skin of left ear and external auricular canal: Secondary | ICD-10-CM | POA: Diagnosis not present

## 2015-08-04 DIAGNOSIS — B351 Tinea unguium: Secondary | ICD-10-CM | POA: Diagnosis not present

## 2015-08-04 DIAGNOSIS — M79674 Pain in right toe(s): Secondary | ICD-10-CM | POA: Diagnosis not present

## 2015-08-04 DIAGNOSIS — M79675 Pain in left toe(s): Secondary | ICD-10-CM | POA: Diagnosis not present

## 2015-08-17 IMAGING — CR DG KNEE COMPLETE 4+V*L*
5 series · 5 of 5 positions shown · non-contrast
Comparison: 01/22/2013.

CLINICAL DATA: Left leg pain following a fall.

LEFT KNEE - COMPLETE 4+ VIEW

[x knee lat left (1 of 5)]
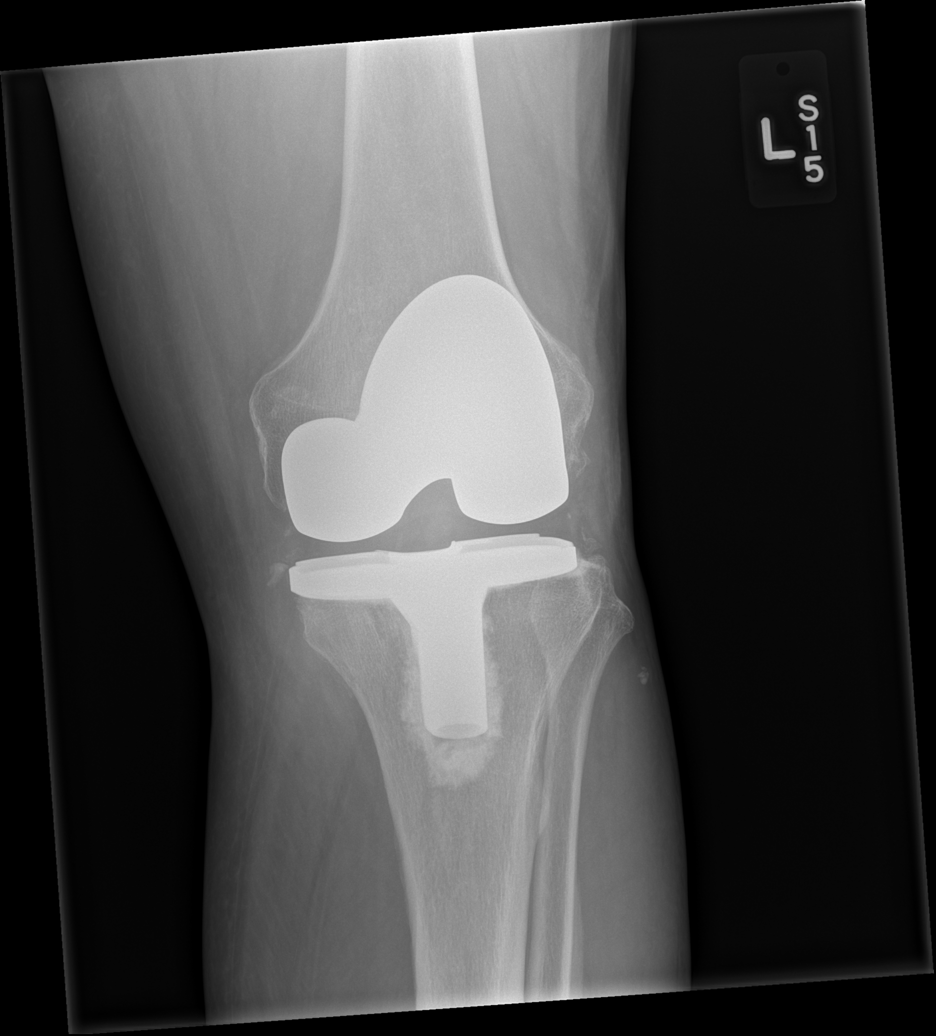

[x knee lat left (2 of 5)]
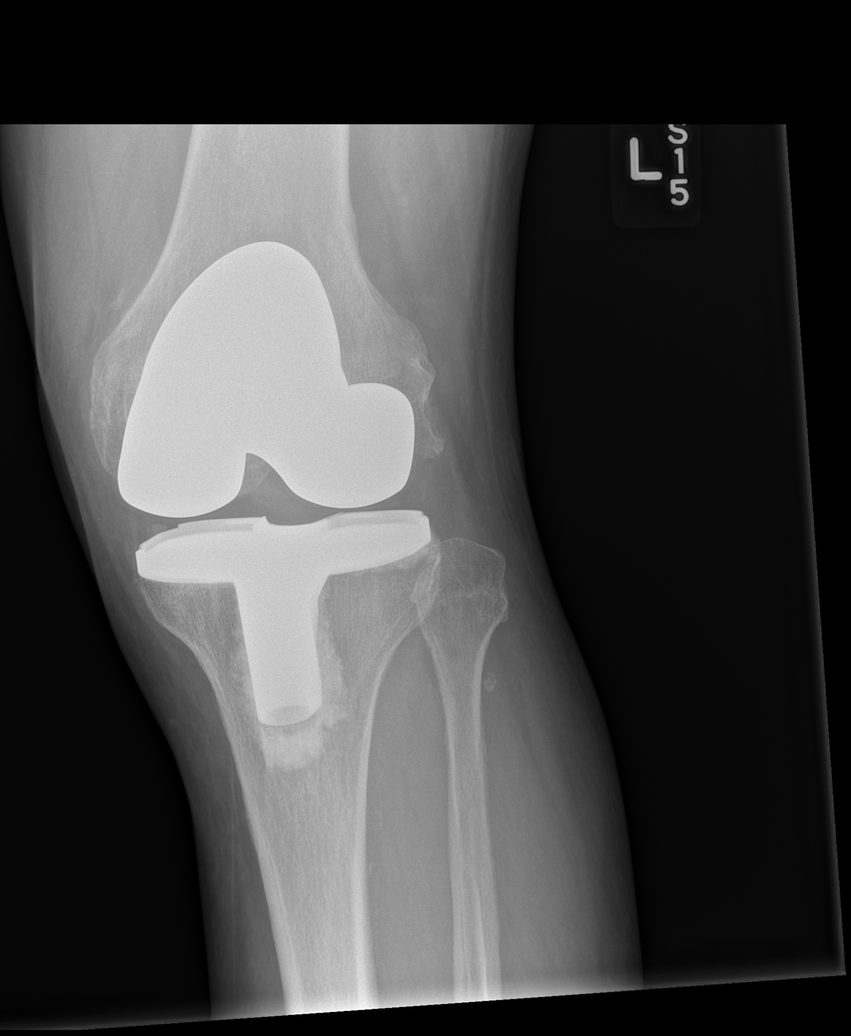

[x knee lat left (3 of 5)]
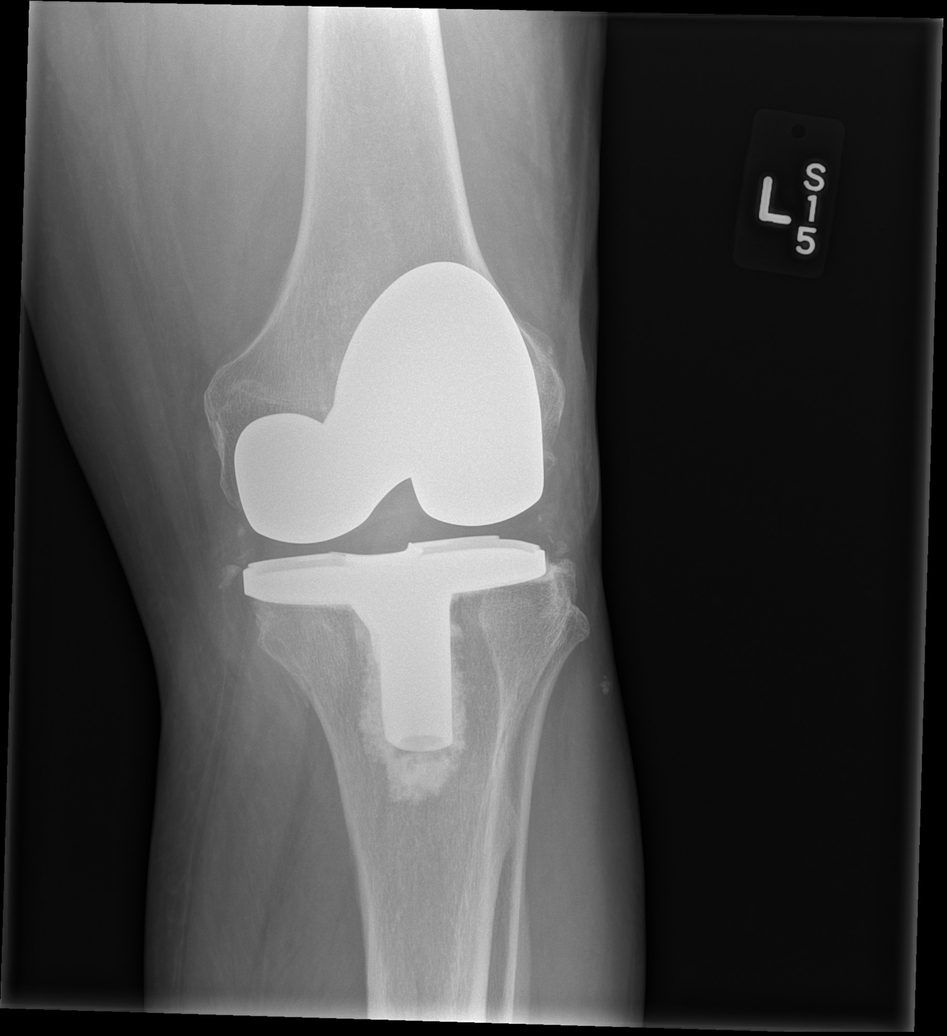

[x knee lat left (4 of 5)]
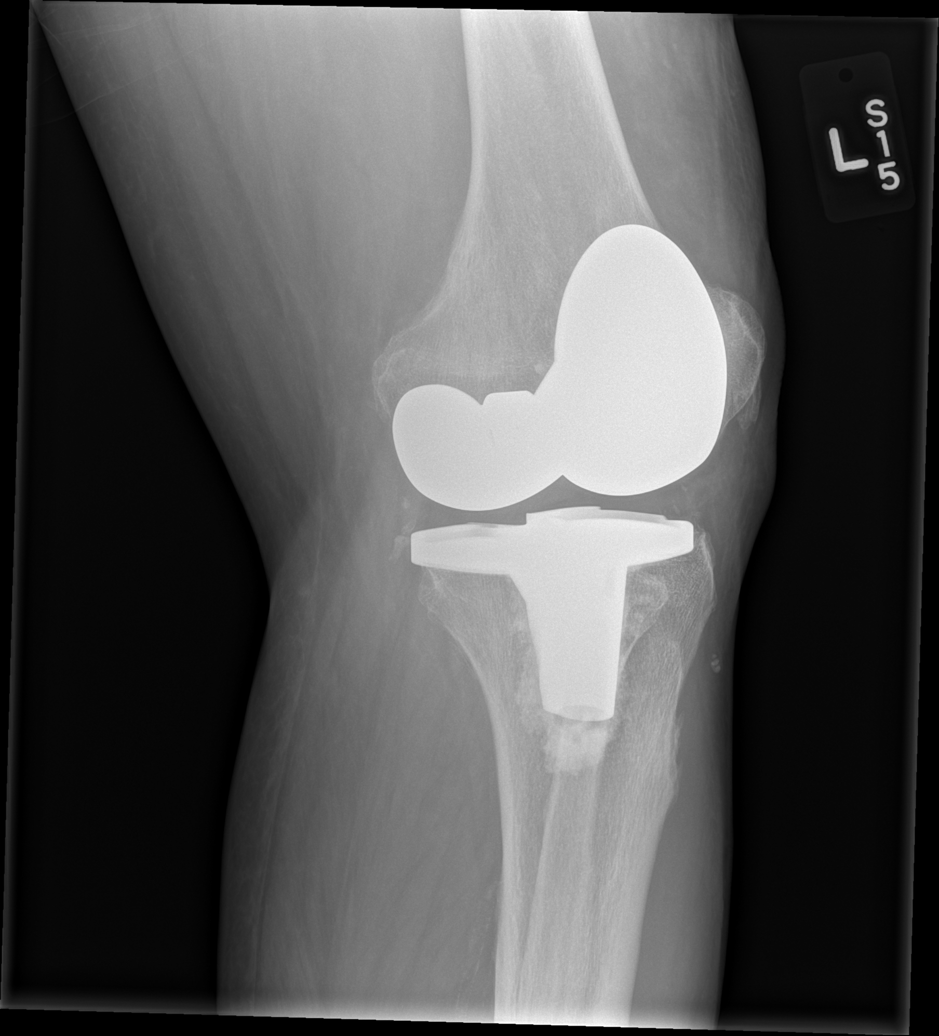

[x knee lat left (5 of 5)]
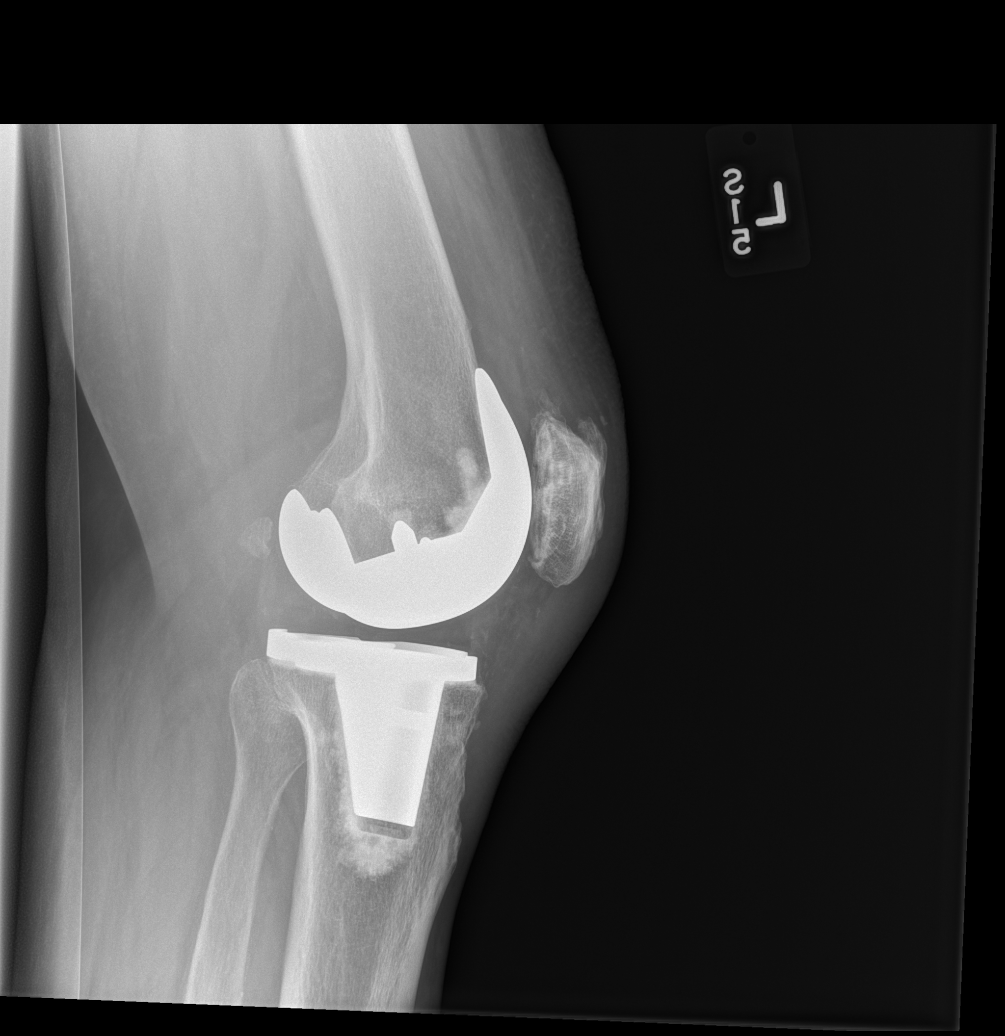

[5 of 5 positions shown; findings below may reference images not displayed]

FINDINGS: Anatomic alignment the left knee.  No fracture or
hardware complication identified.  Uncomplicated left three-part
total knee arthroplasty.
IMPRESSION: No acute abnormality.  Uncomplicated left three-part total knee
arthroplasty.

## 2015-08-27 DIAGNOSIS — Z6833 Body mass index (BMI) 33.0-33.9, adult: Secondary | ICD-10-CM | POA: Diagnosis not present

## 2015-08-27 DIAGNOSIS — M4726 Other spondylosis with radiculopathy, lumbar region: Secondary | ICD-10-CM | POA: Diagnosis not present

## 2015-08-27 DIAGNOSIS — I10 Essential (primary) hypertension: Secondary | ICD-10-CM | POA: Diagnosis not present

## 2015-09-09 DIAGNOSIS — M109 Gout, unspecified: Secondary | ICD-10-CM | POA: Diagnosis not present

## 2015-09-09 DIAGNOSIS — D649 Anemia, unspecified: Secondary | ICD-10-CM | POA: Diagnosis not present

## 2015-09-09 DIAGNOSIS — F329 Major depressive disorder, single episode, unspecified: Secondary | ICD-10-CM | POA: Diagnosis not present

## 2015-09-09 DIAGNOSIS — N183 Chronic kidney disease, stage 3 (moderate): Secondary | ICD-10-CM | POA: Diagnosis not present

## 2015-09-09 DIAGNOSIS — I10 Essential (primary) hypertension: Secondary | ICD-10-CM | POA: Diagnosis not present

## 2015-09-15 DIAGNOSIS — N401 Enlarged prostate with lower urinary tract symptoms: Secondary | ICD-10-CM | POA: Diagnosis not present

## 2015-09-15 DIAGNOSIS — N529 Male erectile dysfunction, unspecified: Secondary | ICD-10-CM | POA: Diagnosis not present

## 2015-10-07 DIAGNOSIS — M79674 Pain in right toe(s): Secondary | ICD-10-CM | POA: Diagnosis not present

## 2015-10-07 DIAGNOSIS — M79675 Pain in left toe(s): Secondary | ICD-10-CM | POA: Diagnosis not present

## 2015-10-07 DIAGNOSIS — B351 Tinea unguium: Secondary | ICD-10-CM | POA: Diagnosis not present

## 2015-10-15 DIAGNOSIS — G4700 Insomnia, unspecified: Secondary | ICD-10-CM | POA: Diagnosis not present

## 2015-10-15 DIAGNOSIS — F329 Major depressive disorder, single episode, unspecified: Secondary | ICD-10-CM | POA: Diagnosis not present

## 2015-12-25 DIAGNOSIS — M79675 Pain in left toe(s): Secondary | ICD-10-CM | POA: Diagnosis not present

## 2015-12-25 DIAGNOSIS — B351 Tinea unguium: Secondary | ICD-10-CM | POA: Diagnosis not present

## 2015-12-25 DIAGNOSIS — M79674 Pain in right toe(s): Secondary | ICD-10-CM | POA: Diagnosis not present

## 2016-01-26 DIAGNOSIS — C4441 Basal cell carcinoma of skin of scalp and neck: Secondary | ICD-10-CM | POA: Diagnosis not present

## 2016-01-26 DIAGNOSIS — L821 Other seborrheic keratosis: Secondary | ICD-10-CM | POA: Diagnosis not present

## 2016-01-26 DIAGNOSIS — C44319 Basal cell carcinoma of skin of other parts of face: Secondary | ICD-10-CM | POA: Diagnosis not present

## 2016-01-26 DIAGNOSIS — L578 Other skin changes due to chronic exposure to nonionizing radiation: Secondary | ICD-10-CM | POA: Diagnosis not present

## 2016-01-26 DIAGNOSIS — L570 Actinic keratosis: Secondary | ICD-10-CM | POA: Diagnosis not present

## 2016-01-27 DIAGNOSIS — N451 Epididymitis: Secondary | ICD-10-CM | POA: Diagnosis not present

## 2016-01-27 DIAGNOSIS — J029 Acute pharyngitis, unspecified: Secondary | ICD-10-CM | POA: Diagnosis not present

## 2016-01-27 DIAGNOSIS — Z125 Encounter for screening for malignant neoplasm of prostate: Secondary | ICD-10-CM | POA: Diagnosis not present

## 2016-01-27 DIAGNOSIS — N529 Male erectile dysfunction, unspecified: Secondary | ICD-10-CM | POA: Diagnosis not present

## 2016-01-27 DIAGNOSIS — N401 Enlarged prostate with lower urinary tract symptoms: Secondary | ICD-10-CM | POA: Diagnosis not present

## 2016-02-16 DIAGNOSIS — Z23 Encounter for immunization: Secondary | ICD-10-CM | POA: Diagnosis not present

## 2016-02-26 DIAGNOSIS — B351 Tinea unguium: Secondary | ICD-10-CM | POA: Diagnosis not present

## 2016-02-26 DIAGNOSIS — M79674 Pain in right toe(s): Secondary | ICD-10-CM | POA: Diagnosis not present

## 2016-02-26 DIAGNOSIS — M79675 Pain in left toe(s): Secondary | ICD-10-CM | POA: Diagnosis not present

## 2016-03-17 DIAGNOSIS — N401 Enlarged prostate with lower urinary tract symptoms: Secondary | ICD-10-CM | POA: Diagnosis not present

## 2016-03-17 DIAGNOSIS — N529 Male erectile dysfunction, unspecified: Secondary | ICD-10-CM | POA: Diagnosis not present

## 2016-03-18 DIAGNOSIS — M65332 Trigger finger, left middle finger: Secondary | ICD-10-CM

## 2016-03-18 DIAGNOSIS — S62337A Displaced fracture of neck of fifth metacarpal bone, left hand, initial encounter for closed fracture: Secondary | ICD-10-CM | POA: Diagnosis not present

## 2016-03-18 HISTORY — DX: Trigger finger, left middle finger: M65.332

## 2016-04-15 DIAGNOSIS — M65332 Trigger finger, left middle finger: Secondary | ICD-10-CM | POA: Diagnosis not present

## 2016-04-15 DIAGNOSIS — M25511 Pain in right shoulder: Secondary | ICD-10-CM | POA: Diagnosis not present

## 2016-04-15 DIAGNOSIS — M79642 Pain in left hand: Secondary | ICD-10-CM | POA: Diagnosis not present

## 2016-04-15 DIAGNOSIS — S62337D Displaced fracture of neck of fifth metacarpal bone, left hand, subsequent encounter for fracture with routine healing: Secondary | ICD-10-CM | POA: Diagnosis not present

## 2016-04-15 DIAGNOSIS — S62337A Displaced fracture of neck of fifth metacarpal bone, left hand, initial encounter for closed fracture: Secondary | ICD-10-CM | POA: Diagnosis not present

## 2016-04-20 DIAGNOSIS — I10 Essential (primary) hypertension: Secondary | ICD-10-CM | POA: Diagnosis not present

## 2016-04-20 DIAGNOSIS — G4700 Insomnia, unspecified: Secondary | ICD-10-CM | POA: Diagnosis not present

## 2016-04-20 DIAGNOSIS — F329 Major depressive disorder, single episode, unspecified: Secondary | ICD-10-CM | POA: Diagnosis not present

## 2016-04-20 DIAGNOSIS — Z0000 Encounter for general adult medical examination without abnormal findings: Secondary | ICD-10-CM | POA: Diagnosis not present

## 2016-04-27 DIAGNOSIS — L578 Other skin changes due to chronic exposure to nonionizing radiation: Secondary | ICD-10-CM | POA: Diagnosis not present

## 2016-04-27 DIAGNOSIS — L570 Actinic keratosis: Secondary | ICD-10-CM | POA: Diagnosis not present

## 2016-04-27 DIAGNOSIS — L821 Other seborrheic keratosis: Secondary | ICD-10-CM | POA: Diagnosis not present

## 2016-05-06 DIAGNOSIS — B351 Tinea unguium: Secondary | ICD-10-CM | POA: Diagnosis not present

## 2016-05-06 DIAGNOSIS — M79674 Pain in right toe(s): Secondary | ICD-10-CM | POA: Diagnosis not present

## 2016-05-06 DIAGNOSIS — M79675 Pain in left toe(s): Secondary | ICD-10-CM | POA: Diagnosis not present

## 2016-05-07 DIAGNOSIS — R0602 Shortness of breath: Secondary | ICD-10-CM | POA: Diagnosis not present

## 2016-05-07 DIAGNOSIS — J209 Acute bronchitis, unspecified: Secondary | ICD-10-CM | POA: Diagnosis not present

## 2016-07-02 DIAGNOSIS — J111 Influenza due to unidentified influenza virus with other respiratory manifestations: Secondary | ICD-10-CM | POA: Diagnosis not present

## 2016-07-05 DIAGNOSIS — J22 Unspecified acute lower respiratory infection: Secondary | ICD-10-CM | POA: Diagnosis not present

## 2016-07-15 DIAGNOSIS — M79675 Pain in left toe(s): Secondary | ICD-10-CM | POA: Diagnosis not present

## 2016-07-15 DIAGNOSIS — M79674 Pain in right toe(s): Secondary | ICD-10-CM | POA: Diagnosis not present

## 2016-07-15 DIAGNOSIS — B351 Tinea unguium: Secondary | ICD-10-CM | POA: Diagnosis not present

## 2016-08-12 DIAGNOSIS — H2513 Age-related nuclear cataract, bilateral: Secondary | ICD-10-CM | POA: Diagnosis not present

## 2016-09-14 DIAGNOSIS — R351 Nocturia: Secondary | ICD-10-CM | POA: Diagnosis not present

## 2016-09-14 DIAGNOSIS — N401 Enlarged prostate with lower urinary tract symptoms: Secondary | ICD-10-CM | POA: Diagnosis not present

## 2016-10-21 DIAGNOSIS — M79674 Pain in right toe(s): Secondary | ICD-10-CM | POA: Diagnosis not present

## 2016-10-21 DIAGNOSIS — M79675 Pain in left toe(s): Secondary | ICD-10-CM | POA: Diagnosis not present

## 2016-10-21 DIAGNOSIS — B351 Tinea unguium: Secondary | ICD-10-CM | POA: Diagnosis not present

## 2016-10-26 DIAGNOSIS — C4441 Basal cell carcinoma of skin of scalp and neck: Secondary | ICD-10-CM | POA: Diagnosis not present

## 2016-10-26 DIAGNOSIS — L821 Other seborrheic keratosis: Secondary | ICD-10-CM | POA: Diagnosis not present

## 2016-10-26 DIAGNOSIS — L578 Other skin changes due to chronic exposure to nonionizing radiation: Secondary | ICD-10-CM | POA: Diagnosis not present

## 2016-12-23 DIAGNOSIS — M79675 Pain in left toe(s): Secondary | ICD-10-CM | POA: Diagnosis not present

## 2016-12-23 DIAGNOSIS — B351 Tinea unguium: Secondary | ICD-10-CM | POA: Diagnosis not present

## 2016-12-23 DIAGNOSIS — M79674 Pain in right toe(s): Secondary | ICD-10-CM | POA: Diagnosis not present

## 2017-02-24 DIAGNOSIS — Z1331 Encounter for screening for depression: Secondary | ICD-10-CM | POA: Diagnosis not present

## 2017-02-24 DIAGNOSIS — S29012A Strain of muscle and tendon of back wall of thorax, initial encounter: Secondary | ICD-10-CM | POA: Diagnosis not present

## 2017-02-24 DIAGNOSIS — Z6832 Body mass index (BMI) 32.0-32.9, adult: Secondary | ICD-10-CM | POA: Diagnosis not present

## 2017-02-24 DIAGNOSIS — M62830 Muscle spasm of back: Secondary | ICD-10-CM | POA: Diagnosis not present

## 2017-02-24 DIAGNOSIS — Z23 Encounter for immunization: Secondary | ICD-10-CM | POA: Diagnosis not present

## 2017-03-01 DIAGNOSIS — H60501 Unspecified acute noninfective otitis externa, right ear: Secondary | ICD-10-CM | POA: Diagnosis not present

## 2017-03-03 DIAGNOSIS — H60539 Acute contact otitis externa, unspecified ear: Secondary | ICD-10-CM | POA: Diagnosis not present

## 2017-03-03 DIAGNOSIS — B351 Tinea unguium: Secondary | ICD-10-CM | POA: Diagnosis not present

## 2017-03-03 DIAGNOSIS — M79675 Pain in left toe(s): Secondary | ICD-10-CM | POA: Diagnosis not present

## 2017-03-03 DIAGNOSIS — M79674 Pain in right toe(s): Secondary | ICD-10-CM | POA: Diagnosis not present

## 2017-03-21 DIAGNOSIS — N401 Enlarged prostate with lower urinary tract symptoms: Secondary | ICD-10-CM | POA: Diagnosis not present

## 2017-03-21 DIAGNOSIS — Z125 Encounter for screening for malignant neoplasm of prostate: Secondary | ICD-10-CM | POA: Diagnosis not present

## 2017-03-31 DIAGNOSIS — R1011 Right upper quadrant pain: Secondary | ICD-10-CM | POA: Diagnosis not present

## 2017-03-31 DIAGNOSIS — G8929 Other chronic pain: Secondary | ICD-10-CM | POA: Diagnosis not present

## 2017-03-31 DIAGNOSIS — R1031 Right lower quadrant pain: Secondary | ICD-10-CM | POA: Diagnosis not present

## 2017-03-31 DIAGNOSIS — R109 Unspecified abdominal pain: Secondary | ICD-10-CM | POA: Diagnosis not present

## 2017-04-05 DIAGNOSIS — R109 Unspecified abdominal pain: Secondary | ICD-10-CM | POA: Diagnosis not present

## 2017-04-05 DIAGNOSIS — Z6832 Body mass index (BMI) 32.0-32.9, adult: Secondary | ICD-10-CM | POA: Diagnosis not present

## 2017-04-05 DIAGNOSIS — R1031 Right lower quadrant pain: Secondary | ICD-10-CM | POA: Diagnosis not present

## 2017-04-11 DIAGNOSIS — R1031 Right lower quadrant pain: Secondary | ICD-10-CM | POA: Diagnosis not present

## 2017-04-11 DIAGNOSIS — R109 Unspecified abdominal pain: Secondary | ICD-10-CM | POA: Diagnosis not present

## 2017-04-11 DIAGNOSIS — K5730 Diverticulosis of large intestine without perforation or abscess without bleeding: Secondary | ICD-10-CM | POA: Diagnosis not present

## 2017-04-19 DIAGNOSIS — N401 Enlarged prostate with lower urinary tract symptoms: Secondary | ICD-10-CM | POA: Diagnosis not present

## 2017-04-19 DIAGNOSIS — N138 Other obstructive and reflux uropathy: Secondary | ICD-10-CM | POA: Diagnosis not present

## 2017-04-19 DIAGNOSIS — R109 Unspecified abdominal pain: Secondary | ICD-10-CM | POA: Diagnosis not present

## 2017-04-19 DIAGNOSIS — Z6832 Body mass index (BMI) 32.0-32.9, adult: Secondary | ICD-10-CM | POA: Diagnosis not present

## 2017-04-26 DIAGNOSIS — L578 Other skin changes due to chronic exposure to nonionizing radiation: Secondary | ICD-10-CM | POA: Diagnosis not present

## 2017-04-26 DIAGNOSIS — L570 Actinic keratosis: Secondary | ICD-10-CM | POA: Diagnosis not present

## 2017-04-26 DIAGNOSIS — L821 Other seborrheic keratosis: Secondary | ICD-10-CM | POA: Diagnosis not present

## 2017-05-06 DIAGNOSIS — M79674 Pain in right toe(s): Secondary | ICD-10-CM | POA: Diagnosis not present

## 2017-05-06 DIAGNOSIS — B351 Tinea unguium: Secondary | ICD-10-CM | POA: Diagnosis not present

## 2017-05-06 DIAGNOSIS — M79675 Pain in left toe(s): Secondary | ICD-10-CM | POA: Diagnosis not present

## 2017-06-03 DIAGNOSIS — N401 Enlarged prostate with lower urinary tract symptoms: Secondary | ICD-10-CM | POA: Diagnosis not present

## 2017-06-03 DIAGNOSIS — M5416 Radiculopathy, lumbar region: Secondary | ICD-10-CM | POA: Diagnosis not present

## 2017-07-11 DIAGNOSIS — M79675 Pain in left toe(s): Secondary | ICD-10-CM | POA: Diagnosis not present

## 2017-07-11 DIAGNOSIS — M79674 Pain in right toe(s): Secondary | ICD-10-CM | POA: Diagnosis not present

## 2017-07-11 DIAGNOSIS — B351 Tinea unguium: Secondary | ICD-10-CM | POA: Diagnosis not present

## 2017-08-11 DIAGNOSIS — H2513 Age-related nuclear cataract, bilateral: Secondary | ICD-10-CM | POA: Diagnosis not present

## 2017-09-07 DIAGNOSIS — D649 Anemia, unspecified: Secondary | ICD-10-CM | POA: Diagnosis not present

## 2017-09-07 DIAGNOSIS — M109 Gout, unspecified: Secondary | ICD-10-CM | POA: Diagnosis not present

## 2017-09-07 DIAGNOSIS — E785 Hyperlipidemia, unspecified: Secondary | ICD-10-CM | POA: Diagnosis not present

## 2017-09-07 DIAGNOSIS — Z79899 Other long term (current) drug therapy: Secondary | ICD-10-CM | POA: Diagnosis not present

## 2017-09-07 DIAGNOSIS — N183 Chronic kidney disease, stage 3 (moderate): Secondary | ICD-10-CM | POA: Diagnosis not present

## 2017-09-07 DIAGNOSIS — I10 Essential (primary) hypertension: Secondary | ICD-10-CM | POA: Diagnosis not present

## 2017-09-07 DIAGNOSIS — Z0000 Encounter for general adult medical examination without abnormal findings: Secondary | ICD-10-CM | POA: Diagnosis not present

## 2017-09-12 DIAGNOSIS — M79675 Pain in left toe(s): Secondary | ICD-10-CM | POA: Diagnosis not present

## 2017-09-12 DIAGNOSIS — B351 Tinea unguium: Secondary | ICD-10-CM | POA: Diagnosis not present

## 2017-09-12 DIAGNOSIS — B356 Tinea cruris: Secondary | ICD-10-CM | POA: Diagnosis not present

## 2017-09-12 DIAGNOSIS — M79674 Pain in right toe(s): Secondary | ICD-10-CM | POA: Diagnosis not present

## 2017-09-22 DIAGNOSIS — N401 Enlarged prostate with lower urinary tract symptoms: Secondary | ICD-10-CM | POA: Diagnosis not present

## 2017-09-22 DIAGNOSIS — N3289 Other specified disorders of bladder: Secondary | ICD-10-CM | POA: Diagnosis not present

## 2017-10-25 DIAGNOSIS — L821 Other seborrheic keratosis: Secondary | ICD-10-CM | POA: Diagnosis not present

## 2017-10-25 DIAGNOSIS — L578 Other skin changes due to chronic exposure to nonionizing radiation: Secondary | ICD-10-CM | POA: Diagnosis not present

## 2017-10-25 DIAGNOSIS — C44519 Basal cell carcinoma of skin of other part of trunk: Secondary | ICD-10-CM | POA: Diagnosis not present

## 2017-10-25 DIAGNOSIS — L570 Actinic keratosis: Secondary | ICD-10-CM | POA: Diagnosis not present

## 2017-11-10 DIAGNOSIS — C44519 Basal cell carcinoma of skin of other part of trunk: Secondary | ICD-10-CM | POA: Diagnosis not present

## 2017-11-14 DIAGNOSIS — B351 Tinea unguium: Secondary | ICD-10-CM | POA: Diagnosis not present

## 2017-11-14 DIAGNOSIS — M79675 Pain in left toe(s): Secondary | ICD-10-CM | POA: Diagnosis not present

## 2017-11-14 DIAGNOSIS — M79674 Pain in right toe(s): Secondary | ICD-10-CM | POA: Diagnosis not present

## 2017-12-02 DIAGNOSIS — Z23 Encounter for immunization: Secondary | ICD-10-CM | POA: Diagnosis not present

## 2017-12-21 DIAGNOSIS — I701 Atherosclerosis of renal artery: Secondary | ICD-10-CM

## 2017-12-21 DIAGNOSIS — N400 Enlarged prostate without lower urinary tract symptoms: Secondary | ICD-10-CM | POA: Diagnosis not present

## 2017-12-21 DIAGNOSIS — I129 Hypertensive chronic kidney disease with stage 1 through stage 4 chronic kidney disease, or unspecified chronic kidney disease: Secondary | ICD-10-CM

## 2017-12-21 DIAGNOSIS — K5730 Diverticulosis of large intestine without perforation or abscess without bleeding: Secondary | ICD-10-CM | POA: Diagnosis not present

## 2017-12-21 DIAGNOSIS — M109 Gout, unspecified: Secondary | ICD-10-CM | POA: Diagnosis not present

## 2017-12-21 DIAGNOSIS — R0600 Dyspnea, unspecified: Secondary | ICD-10-CM | POA: Diagnosis not present

## 2017-12-21 DIAGNOSIS — N183 Chronic kidney disease, stage 3 (moderate): Secondary | ICD-10-CM

## 2017-12-21 DIAGNOSIS — Z79899 Other long term (current) drug therapy: Secondary | ICD-10-CM | POA: Diagnosis not present

## 2017-12-21 DIAGNOSIS — R0602 Shortness of breath: Secondary | ICD-10-CM | POA: Diagnosis not present

## 2017-12-21 DIAGNOSIS — R079 Chest pain, unspecified: Secondary | ICD-10-CM | POA: Diagnosis not present

## 2017-12-22 ENCOUNTER — Inpatient Hospital Stay (HOSPITAL_COMMUNITY)
Admission: AD | Admit: 2017-12-22 | Payer: Medicare Other | Source: Other Acute Inpatient Hospital | Admitting: Cardiology

## 2017-12-22 ENCOUNTER — Other Ambulatory Visit: Payer: Self-pay

## 2017-12-22 ENCOUNTER — Encounter (HOSPITAL_COMMUNITY): Payer: Self-pay | Admitting: Physician Assistant

## 2017-12-22 ENCOUNTER — Inpatient Hospital Stay (HOSPITAL_COMMUNITY)
Admission: AD | Admit: 2017-12-22 | Discharge: 2018-01-03 | DRG: 233 | Disposition: A | Discharge status: Home Health | Payer: Medicare Other | Source: Other Acute Inpatient Hospital | Attending: Cardiothoracic Surgery | Admitting: Cardiothoracic Surgery

## 2017-12-22 DIAGNOSIS — Z4682 Encounter for fitting and adjustment of non-vascular catheter: Secondary | ICD-10-CM | POA: Diagnosis not present

## 2017-12-22 DIAGNOSIS — I4891 Unspecified atrial fibrillation: Secondary | ICD-10-CM | POA: Diagnosis not present

## 2017-12-22 DIAGNOSIS — I313 Pericardial effusion (noninflammatory): Secondary | ICD-10-CM | POA: Diagnosis not present

## 2017-12-22 DIAGNOSIS — N179 Acute kidney failure, unspecified: Secondary | ICD-10-CM | POA: Diagnosis not present

## 2017-12-22 DIAGNOSIS — G629 Polyneuropathy, unspecified: Secondary | ICD-10-CM | POA: Diagnosis present

## 2017-12-22 DIAGNOSIS — D631 Anemia in chronic kidney disease: Secondary | ICD-10-CM | POA: Diagnosis not present

## 2017-12-22 DIAGNOSIS — G4700 Insomnia, unspecified: Secondary | ICD-10-CM | POA: Diagnosis present

## 2017-12-22 DIAGNOSIS — E213 Hyperparathyroidism, unspecified: Secondary | ICD-10-CM | POA: Diagnosis present

## 2017-12-22 DIAGNOSIS — K5900 Constipation, unspecified: Secondary | ICD-10-CM | POA: Diagnosis not present

## 2017-12-22 DIAGNOSIS — Z01811 Encounter for preprocedural respiratory examination: Secondary | ICD-10-CM

## 2017-12-22 DIAGNOSIS — E785 Hyperlipidemia, unspecified: Secondary | ICD-10-CM | POA: Diagnosis present

## 2017-12-22 DIAGNOSIS — Z01810 Encounter for preprocedural cardiovascular examination: Secondary | ICD-10-CM | POA: Diagnosis not present

## 2017-12-22 DIAGNOSIS — Z91048 Other nonmedicinal substance allergy status: Secondary | ICD-10-CM

## 2017-12-22 DIAGNOSIS — R0600 Dyspnea, unspecified: Secondary | ICD-10-CM | POA: Diagnosis not present

## 2017-12-22 DIAGNOSIS — Z79899 Other long term (current) drug therapy: Secondary | ICD-10-CM | POA: Diagnosis not present

## 2017-12-22 DIAGNOSIS — R9439 Abnormal result of other cardiovascular function study: Secondary | ICD-10-CM | POA: Diagnosis not present

## 2017-12-22 DIAGNOSIS — H9190 Unspecified hearing loss, unspecified ear: Secondary | ICD-10-CM | POA: Diagnosis present

## 2017-12-22 DIAGNOSIS — I2583 Coronary atherosclerosis due to lipid rich plaque: Secondary | ICD-10-CM | POA: Diagnosis not present

## 2017-12-22 DIAGNOSIS — Z96653 Presence of artificial knee joint, bilateral: Secondary | ICD-10-CM | POA: Diagnosis present

## 2017-12-22 DIAGNOSIS — J90 Pleural effusion, not elsewhere classified: Secondary | ICD-10-CM

## 2017-12-22 DIAGNOSIS — I2584 Coronary atherosclerosis due to calcified coronary lesion: Secondary | ICD-10-CM | POA: Diagnosis not present

## 2017-12-22 DIAGNOSIS — M1990 Unspecified osteoarthritis, unspecified site: Secondary | ICD-10-CM | POA: Diagnosis present

## 2017-12-22 DIAGNOSIS — J939 Pneumothorax, unspecified: Secondary | ICD-10-CM

## 2017-12-22 DIAGNOSIS — I10 Essential (primary) hypertension: Secondary | ICD-10-CM

## 2017-12-22 DIAGNOSIS — N170 Acute kidney failure with tubular necrosis: Secondary | ICD-10-CM

## 2017-12-22 DIAGNOSIS — I351 Nonrheumatic aortic (valve) insufficiency: Secondary | ICD-10-CM | POA: Diagnosis not present

## 2017-12-22 DIAGNOSIS — D62 Acute posthemorrhagic anemia: Secondary | ICD-10-CM | POA: Diagnosis not present

## 2017-12-22 DIAGNOSIS — J9811 Atelectasis: Secondary | ICD-10-CM | POA: Diagnosis not present

## 2017-12-22 DIAGNOSIS — R079 Chest pain, unspecified: Secondary | ICD-10-CM

## 2017-12-22 DIAGNOSIS — I2582 Chronic total occlusion of coronary artery: Secondary | ICD-10-CM | POA: Diagnosis present

## 2017-12-22 DIAGNOSIS — Z87440 Personal history of urinary (tract) infections: Secondary | ICD-10-CM

## 2017-12-22 DIAGNOSIS — I129 Hypertensive chronic kidney disease with stage 1 through stage 4 chronic kidney disease, or unspecified chronic kidney disease: Secondary | ICD-10-CM | POA: Diagnosis present

## 2017-12-22 DIAGNOSIS — I440 Atrioventricular block, first degree: Secondary | ICD-10-CM | POA: Diagnosis not present

## 2017-12-22 DIAGNOSIS — M109 Gout, unspecified: Secondary | ICD-10-CM | POA: Diagnosis present

## 2017-12-22 DIAGNOSIS — N19 Unspecified kidney failure: Secondary | ICD-10-CM

## 2017-12-22 DIAGNOSIS — I340 Nonrheumatic mitral (valve) insufficiency: Secondary | ICD-10-CM | POA: Diagnosis not present

## 2017-12-22 DIAGNOSIS — N183 Chronic kidney disease, stage 3 unspecified: Secondary | ICD-10-CM

## 2017-12-22 DIAGNOSIS — Z951 Presence of aortocoronary bypass graft: Secondary | ICD-10-CM | POA: Diagnosis not present

## 2017-12-22 DIAGNOSIS — E669 Obesity, unspecified: Secondary | ICD-10-CM | POA: Diagnosis present

## 2017-12-22 DIAGNOSIS — I701 Atherosclerosis of renal artery: Secondary | ICD-10-CM | POA: Diagnosis not present

## 2017-12-22 DIAGNOSIS — E872 Acidosis: Secondary | ICD-10-CM | POA: Diagnosis not present

## 2017-12-22 DIAGNOSIS — Z974 Presence of external hearing-aid: Secondary | ICD-10-CM

## 2017-12-22 DIAGNOSIS — I252 Old myocardial infarction: Secondary | ICD-10-CM

## 2017-12-22 DIAGNOSIS — E8770 Fluid overload, unspecified: Secondary | ICD-10-CM | POA: Diagnosis not present

## 2017-12-22 DIAGNOSIS — N400 Enlarged prostate without lower urinary tract symptoms: Secondary | ICD-10-CM | POA: Diagnosis present

## 2017-12-22 DIAGNOSIS — D696 Thrombocytopenia, unspecified: Secondary | ICD-10-CM | POA: Diagnosis not present

## 2017-12-22 DIAGNOSIS — Z792 Long term (current) use of antibiotics: Secondary | ICD-10-CM

## 2017-12-22 DIAGNOSIS — I2510 Atherosclerotic heart disease of native coronary artery without angina pectoris: Secondary | ICD-10-CM | POA: Diagnosis not present

## 2017-12-22 DIAGNOSIS — Z8249 Family history of ischemic heart disease and other diseases of the circulatory system: Secondary | ICD-10-CM

## 2017-12-22 DIAGNOSIS — I25110 Atherosclerotic heart disease of native coronary artery with unstable angina pectoris: Principal | ICD-10-CM | POA: Diagnosis present

## 2017-12-22 DIAGNOSIS — I371 Nonrheumatic pulmonary valve insufficiency: Secondary | ICD-10-CM | POA: Diagnosis not present

## 2017-12-22 DIAGNOSIS — Z6831 Body mass index (BMI) 31.0-31.9, adult: Secondary | ICD-10-CM

## 2017-12-22 DIAGNOSIS — Z888 Allergy status to other drugs, medicaments and biological substances status: Secondary | ICD-10-CM

## 2017-12-22 DIAGNOSIS — R0789 Other chest pain: Secondary | ICD-10-CM | POA: Diagnosis not present

## 2017-12-22 HISTORY — DX: Chest pain, unspecified: R07.9

## 2017-12-22 MED ORDER — SODIUM CHLORIDE 0.9 % IV SOLN: 250.0000 mL | INTRAVENOUS | Status: DC | PRN

## 2017-12-22 MED ORDER — TRIMETHOPRIM 100 MG PO TABS
100.0000 mg | ORAL_TABLET | Freq: Every evening | ORAL | Status: DC
  Administered 2017-12-22: 100 mg via ORAL
  Filled 2017-12-22 (×2): qty 1

## 2017-12-22 MED ORDER — ZOLPIDEM TARTRATE 5 MG PO TABS
5.0000 mg | ORAL_TABLET | Freq: Every evening | ORAL | Status: DC | PRN
  Administered 2017-12-24 – 2017-12-26 (×2): 5 mg via ORAL
  Filled 2017-12-22 (×2): qty 1

## 2017-12-22 MED ORDER — GABAPENTIN 600 MG PO TABS
600.0000 mg | ORAL_TABLET | Freq: Every day | ORAL | Status: DC
  Administered 2017-12-22: 600 mg via ORAL
  Filled 2017-12-22 (×2): qty 1

## 2017-12-22 MED ORDER — ACETAMINOPHEN 325 MG PO TABS: 650.0000 mg | ORAL_TABLET | ORAL | Status: DC | PRN

## 2017-12-22 MED ORDER — ALLOPURINOL 300 MG PO TABS
300.0000 mg | ORAL_TABLET | Freq: Every morning | ORAL | Status: DC
  Administered 2017-12-23 – 2018-01-03 (×11): 300 mg via ORAL
  Filled 2017-12-22 (×12): qty 1

## 2017-12-22 MED ORDER — SODIUM CHLORIDE 0.9 % IV SOLN
250.0000 mL | INTRAVENOUS | Status: DC | PRN
  Administered 2017-12-28 (×2): via INTRAVENOUS

## 2017-12-22 MED ORDER — ASPIRIN 81 MG PO CHEW
81.0000 mg | CHEWABLE_TABLET | ORAL | Status: AC
  Administered 2017-12-23: 81 mg via ORAL
  Filled 2017-12-22: qty 1

## 2017-12-22 MED ORDER — SODIUM CHLORIDE 0.9% FLUSH: 3.0000 mL | INTRAVENOUS | Status: DC | PRN

## 2017-12-22 MED ORDER — SERTRALINE HCL 50 MG PO TABS
50.0000 mg | ORAL_TABLET | Freq: Every day | ORAL | Status: DC
  Administered 2017-12-22 – 2018-01-03 (×12): 50 mg via ORAL
  Filled 2017-12-22 (×12): qty 1

## 2017-12-22 MED ORDER — FERROUS FUM-IRON POLYSACCH 162-115.2 MG PO CAPS: 1.0000 | ORAL_CAPSULE | Freq: Every day | ORAL | Status: DC

## 2017-12-22 MED ORDER — FERROUS FUMARATE 324 (106 FE) MG PO TABS
106.0000 mg | ORAL_TABLET | Freq: Every day | ORAL | Status: DC
  Administered 2017-12-23: 106 mg via ORAL
  Filled 2017-12-22: qty 1

## 2017-12-22 MED ORDER — SODIUM CHLORIDE 0.9% FLUSH
3.0000 mL | Freq: Two times a day (BID) | INTRAVENOUS | Status: DC
  Administered 2017-12-23: 3 mL via INTRAVENOUS

## 2017-12-22 MED ORDER — ENOXAPARIN SODIUM 40 MG/0.4ML ~~LOC~~ SOLN
40.0000 mg | SUBCUTANEOUS | Status: DC
  Administered 2017-12-22: 40 mg via SUBCUTANEOUS
  Filled 2017-12-22: qty 0.4

## 2017-12-22 MED ORDER — OXYCODONE HCL 5 MG PO TABS: 5.0000 mg | ORAL_TABLET | ORAL | Status: DC | PRN

## 2017-12-22 MED ORDER — VITAMIN B-12 1000 MCG PO TABS
500.0000 ug | ORAL_TABLET | Freq: Every day | ORAL | Status: DC
  Administered 2017-12-22 – 2018-01-01 (×10): 500 ug via ORAL
  Filled 2017-12-22 (×12): qty 1

## 2017-12-22 MED ORDER — NITROGLYCERIN 0.4 MG SL SUBL
0.4000 mg | SUBLINGUAL_TABLET | SUBLINGUAL | Status: DC | PRN
  Administered 2017-12-25 – 2017-12-26 (×4): 0.4 mg via SUBLINGUAL
  Filled 2017-12-22 (×2): qty 1

## 2017-12-22 MED ORDER — ONDANSETRON HCL 4 MG/2ML IJ SOLN: 4.0000 mg | Freq: Four times a day (QID) | INTRAMUSCULAR | Status: DC | PRN

## 2017-12-22 MED ORDER — POLYSACCHARIDE IRON COMPLEX 150 MG PO CAPS
150.0000 mg | ORAL_CAPSULE | Freq: Every day | ORAL | Status: DC
  Administered 2017-12-23: 150 mg via ORAL
  Filled 2017-12-22: qty 1

## 2017-12-22 MED ORDER — ADULT MULTIVITAMIN W/MINERALS CH
1.0000 | ORAL_TABLET | Freq: Every day | ORAL | Status: DC
  Administered 2017-12-22 – 2018-01-03 (×11): 1 via ORAL
  Filled 2017-12-22 (×11): qty 1

## 2017-12-22 MED ORDER — TAMSULOSIN HCL 0.4 MG PO CAPS
0.4000 mg | ORAL_CAPSULE | Freq: Every day | ORAL | Status: DC
  Administered 2017-12-23 – 2018-01-02 (×11): 0.4 mg via ORAL
  Filled 2017-12-22 (×11): qty 1

## 2017-12-22 MED ORDER — AMLODIPINE BESYLATE 10 MG PO TABS
10.0000 mg | ORAL_TABLET | Freq: Every morning | ORAL | Status: DC
  Administered 2017-12-23 – 2017-12-27 (×5): 10 mg via ORAL
  Filled 2017-12-22 (×5): qty 1

## 2017-12-22 MED ORDER — TIZANIDINE HCL 2 MG PO TABS
2.0000 mg | ORAL_TABLET | Freq: Three times a day (TID) | ORAL | Status: DC
  Administered 2017-12-22: 2 mg via ORAL
  Filled 2017-12-22 (×4): qty 1

## 2017-12-22 MED ORDER — SODIUM CHLORIDE 0.9% FLUSH
3.0000 mL | Freq: Two times a day (BID) | INTRAVENOUS | Status: DC
  Administered 2017-12-26 – 2017-12-27 (×2): 3 mL via INTRAVENOUS

## 2017-12-22 MED ORDER — SODIUM CHLORIDE 0.9 % IV SOLN
INTRAVENOUS | Status: DC
  Administered 2017-12-22: 22:00:00 via INTRAVENOUS

## 2017-12-22 NOTE — H&P (Addendum)
Cardiology Admission History and Physical:   Patient ID: [name]; MRN: [id number]; DOB: Dec 25, 1931   Admission date: 12/22/2017  Primary Care Provider: Carlus Pavlov, MD Primary Cardiologist: New, f/u in [place]  Primary Electrophysiologist:  n/a  Chief Complaint:  Chest pain  Patient Profile:   [name] is a 84 y.o. male with a history of BPH, Anemia, HTN, HOH, OA, colon polyps.  History of Present Illness:   [name] is an 84 year old male with a history of hypertension, chronic kidney disease but no cardiac dz who was sitting in a chair yesterday and  Developed achiness in his L shoulder.  He took some pain meds and rubbed some aspercream on it and the pain subsided.  He then started having pressure in his chest midsternal with radiation into his back.. He also noticed problems taking a deep breath in and said it would hurt. The sx continued and he ended up going to [facility]. He could breathe better sitting up. Describes orthopnea. No PND. No LE edema. No palpitations. No hx DOE.   At [facility], he was complaining of chest soreness, radiating around to his back. He continued to have problems taking a deep breath so a D-dimer was done which was elevated>>CT chest showed  no PE but had coronary calcifications worrisome for 3 vessel ASCAD.    He ruled out for MI by normal troponin and then had a nuclear stress test showing moderate stress-induced perfusion defect in the inferolateral area. EF 56%. At the periphery, the perfusion defect was fixed. He is now transferred to [facility] for further evaluation and cath. Cr today at [facility] 1.7.  He denies any chest discomfort at present.  He has never smoked or drank alcohol.  He does have a family history of CAD in his Dad at 74 but he was a heavy smoker.      Past Medical History:  Diagnosis Date  . Anemia    takes Iron pill daily  . Arthritis   . Cancer ([place]) 02/14/13   skin cancer  . Cataract    right eye  and  immature  . Chest pain with high risk for cardiac etiology, abnormal Myoview 12/22/2017  . Constipation   . Depression    no meds required  . Enlarged prostate    takes Flomax daily  . Gout    takes Allopurinol daily  . Hemorrhoids    occasionally  . History of blood transfusion    lap - colon blockage  . History of bronchitis    last time in Dec 2014  . History of colon polyps   . History of UTI    takes Trimethoprim daily  . Hypertension    takes Amlodipine and Avapro daily  . Impaired hearing    wears hearing aids  . Insomnia    takes Restoril nightly  . Joint pain   . Neuromuscular disorder ([place]) 02/14/13   damnage nerves in bil feet  . Neuropathy   . Peripheral neuropathy   . Renal calculus    large  . Shortness of breath    with exertion    Past Surgical History:  Procedure Laterality Date  . APPENDECTOMY  1964  . basal cell removed from back and face   2005  . basal cell removed from cheek and nose  2009  . basal cell removed from left ear  2014  . COLONOSCOPY  2010  . ESOPHAGOGASTRODUODENOSCOPY    . HEMORRHOID SURGERY  1970/2006  .  INTRAMEDULLARY (IM) NAIL INTERTROCHANTERIC Right 02/15/2013   Procedure: INTRAMEDULLARY (IM) NAIL INTERTROCHANTRIC;  Surgeon: Rozanna Box, MD;  Location: [facility];  Service: Orthopedics;  Laterality: Right;  . JOINT REPLACEMENT Left 09   knee  . laparoscopic surgery   2006   d/t blockage in intestine   . LUMBAR LAMINECTOMY/DECOMPRESSION MICRODISCECTOMY Left 06/01/2013   Procedure: LEFT LUMBAR THREE-FOUR DECOMPRESSION LUMBAR LAMINECTOMY/MICRODISCECTOMY;POSSIBLE LUMBAR FOUR-FIVE;  Surgeon: Charlie Pitter, MD;  Location: [facility];  Service: Neurosurgery;  Laterality: Left;  left  . right hip surgery  2014  . TONSILLECTOMY  1970/2006  . TOTAL KNEE ARTHROPLASTY Right 09/03/2013   Procedure: RIGHT TOTAL KNEE ARTHROPLASTY;  Surgeon: Vickey Huger, MD;  Location: [facility];  Service: Orthopedics;  Laterality: Right;     Medications Prior to  Admission: Prior to Admission medications   Medication Sig Start Date End Date Taking? Authorizing Provider  acetaminophen (TYLENOL) 500 MG tablet Take 1,000 mg by mouth every 6 (six) hours as needed for pain.    [provider]  allopurinol (ZYLOPRIM) 300 MG tablet Take 300 mg by mouth every morning.    [provider]  amLODipine (NORVASC) 10 MG tablet Take 10 mg by mouth every morning.    [provider]  enoxaparin (LOVENOX) 40 MG/0.4ML injection Inject 0.4 mLs (40 mg total) into the skin daily. 09/04/13   Carlynn Spry, PA-C  ferrous fumarate-iron polysaccharide complex (TANDEM) 162-115.2 MG CAPS Take 1 capsule by mouth daily with breakfast.    [provider]  gabapentin (NEURONTIN) 600 MG tablet Take 600 mg by mouth daily.     [provider]  ibuprofen (ADVIL,MOTRIN) 200 MG tablet Take 400-600 mg by mouth every 6 (six) hours as needed (for pain).    [provider]  irbesartan (AVAPRO) 150 MG tablet Take 150 mg by mouth daily.    [provider]  Multiple Vitamin (MULTIVITAMIN WITH MINERALS) TABS tablet Take 1 tablet by mouth daily.    [provider]  oxyCODONE (OXY IR/ROXICODONE) 5 MG immediate release tablet Take 1-2 tablets (5-10 mg total) by mouth every 3 (three) hours as needed for breakthrough pain. 09/04/13   Carlynn Spry, PA-C  sertraline (ZOLOFT) 50 MG tablet Take 50 mg by mouth daily.    [provider]  tamsulosin (FLOMAX) 0.4 MG CAPS capsule Take 0.4 mg by mouth daily after supper.     [provider]  temazepam (RESTORIL) 15 MG capsule Take 15 mg by mouth at bedtime as needed for sleep.    [provider]  tiZANidine (ZANAFLEX) 2 MG tablet Take 1 tablet (2 mg total) by mouth 3 (three) times daily. 09/04/13   Carlynn Spry, PA-C  trimethoprim (TRIMPEX) 100 MG tablet Take 100 mg by mouth every evening. Takes to keep from getting UTI s.    [provider]  vitamin B-12  (CYANOCOBALAMIN) 500 MCG tablet Take 500 mcg by mouth daily.    [provider]  zolpidem (AMBIEN) 10 MG tablet Take 10 mg by mouth at bedtime as needed for sleep.    [provider]     Allergies:    Allergies  Allergen Reactions  . Adhesive [Tape]     redness  . Ativan [Lorazepam] Other (See Comments)    Hallucinations.  Has an adverse reaction to medication.  Disoriented.  "Mean"    Social History:   Social History   Socioeconomic History  . Marital status: Married    Spouse name: Not on  file  . Number of children: Not on file  . Years of education: Not on file  . Highest education level: Not on file  Occupational History  . Occupation: Retired  Scientific laboratory technician  . Financial resource strain: Not on file  . Food insecurity:    Worry: Not on file    Inability: Not on file  . Transportation needs:    Medical: Not on file    Non-medical: Not on file  Tobacco Use  . Smoking status: Never Smoker  Substance and Sexual Activity  . Alcohol use: No  . Drug use: No  . Sexual activity: Not Currently  Lifestyle  . Physical activity:    Days per week: Not on file    Minutes per session: Not on file  . Stress: Not on file  Relationships  . Social connections:    Talks on phone: Not on file    Gets together: Not on file    Attends religious service: Not on file    Active member of club or organization: Not on file    Attends meetings of clubs or organizations: Not on file    Relationship status: Not on file  . Intimate partner violence:    Fear of current or ex partner: Not on file    Emotionally abused: Not on file    Physically abused: Not on file    Forced sexual activity: Not on file  Other Topics Concern  . Not on file  Social History Narrative  . Not on file    Family History:   The patient's family history includes Emphysema in his mother; Heart attack (age of onset: 48) in his father; Rheum arthritis in his sister.   The patient He indicated  that his mother is deceased. He indicated that his father is deceased. He indicated that only one of his two sisters is alive.    ROS:  Please see the history of present illness.  All other ROS reviewed and negative.     Physical Exam/Data:  General:  Well nourished, well developed, in no acute distress HEENT: normal Lymph: no adenopathy Neck:  JVD not elevated Endocrine:  No thryomegaly Vascular: No carotid bruits; FA pulses 2+ bilaterally without bruits  Cardiac:  normal S1, S2; RRR; murmur, no rub or gallop  Lungs:  clear to auscultation bilaterally, no wheezing, rhonchi or rales  Abd: soft, nontender, no hepatomegaly  Ext: no edema Musculoskeletal:  No deformities, BUE and BLE strength normal and equal Skin: warm and dry  Neuro:  CNs 2-12 intact, no focal abnormalities noted Psych:  Normal affect    EKG:  The ECG that was done 08/08 was personally reviewed and demonstrates SR, no acute ischemic changes, PVCs noted.  Relevant CV Studies: None here  Laboratory Data:see paper chart.   Radiology/Studies:  See paper chart  Assessment and Plan:   1.  Chest pain with typical and atypical sx.  -cardiac enzymes negative x 2 -EKG is nonischemic -nuclear stress test with inferolateral ischemia -Chest CT with no PE but multivessel CAD -plan for cardiac cath in am Cardiac catheterization was discussed with the patient fully. The patient understands that risks include but are not limited to stroke (1 in 1000), death (1 in 16), kidney failure [usually temporary] (1 in 500), bleeding (1 in 200), allergic reaction [possibly serious] (1 in 200).  The patient understands and is willing to proceed.   -continue ASA 81mg  daily, statin -start IV Heparin gtt per pharmacy -start Lopressor  25mg  BID -NPO after MN for cath  2.  HTN  -BP controlled on exam -hold ARB for cath and acute on CKD -continue amlodipine 5mg  daily and start lopressor 25mg  BID  3.  Acute on CKD stage 3 -hold  ARB -IVF hydration overnight with NS at 50cc/hr -repeat BMET in am   Severity of Illness: The appropriate patient status for this patient is INPATIENT. Inpatient status is judged to be reasonable and necessary in order to provide the required intensity of service to ensure the patient's safety. The patient's presenting symptoms, physical exam findings, and initial radiographic and laboratory data in the context of their chronic comorbidities is felt to place them at high risk for further clinical deterioration. Furthermore, it is not anticipated that the patient will be medically stable for discharge from the hospital within 2 midnights of admission. The following factors support the patient status of inpatient.   " The patient's presenting symptoms include chest pain. " The worrisome physical exam findings include chest pain. " The initial radiographic and laboratory data are worrisome because of abnormal stress test. " The chronic co-morbidities include HTN.   * I certify that at the point of admission it is my clinical judgment that the patient will require inpatient hospital care spanning beyond 2 midnights from the point of admission due to high intensity of service, high risk for further deterioration and high frequency of surveillance required.*    For questions or updates, please contact [facility] Please consult [email or web address] for contact info under Cardiology/STEMI.    Signed, Fransico Him, MD [facility] 12/22/2017 7:55 PM

## 2017-12-23 ENCOUNTER — Other Ambulatory Visit: Payer: Self-pay | Admitting: *Deleted

## 2017-12-23 ENCOUNTER — Encounter (HOSPITAL_COMMUNITY)
Admission: AD | Disposition: A | Discharge status: Home Health | Payer: Self-pay | Source: Other Acute Inpatient Hospital | Attending: Cardiothoracic Surgery

## 2017-12-23 ENCOUNTER — Encounter (HOSPITAL_COMMUNITY): Payer: Self-pay

## 2017-12-23 DIAGNOSIS — N183 Chronic kidney disease, stage 3 (moderate): Secondary | ICD-10-CM

## 2017-12-23 DIAGNOSIS — I25110 Atherosclerotic heart disease of native coronary artery with unstable angina pectoris: Principal | ICD-10-CM

## 2017-12-23 DIAGNOSIS — I2510 Atherosclerotic heart disease of native coronary artery without angina pectoris: Secondary | ICD-10-CM

## 2017-12-23 HISTORY — PX: LEFT HEART CATH AND CORONARY ANGIOGRAPHY: CATH118249

## 2017-12-23 LAB — LIPID PANEL
CHOL/HDL RATIO: 3 ratio
Cholesterol: 112 mg/dL (ref 0–200)
HDL: 37 mg/dL — AB (ref >40)
LDL Cholesterol: 65 mg/dL (ref 0–99)
Triglycerides: 52 mg/dL (ref <150)
VLDL: 10 mg/dL (ref 0–40)

## 2017-12-23 LAB — PROTIME-INR
INR: 1.17
PROTHROMBIN TIME: 14.8 s (ref 11.4–15.2)

## 2017-12-23 LAB — COMPREHENSIVE METABOLIC PANEL
ALBUMIN: 3.2 g/dL — AB (ref 3.5–5.0)
ALT: 14 U/L (ref 0–44)
AST: 16 U/L (ref 15–41)
Alkaline Phosphatase: 48 U/L (ref 38–126)
Anion gap: 13 (ref 5–15)
BUN: 24 mg/dL — AB (ref 8–23)
CHLORIDE: 105 mmol/L (ref 98–111)
CO2: 19 mmol/L — AB (ref 22–32)
Calcium: 8.5 mg/dL — ABNORMAL LOW (ref 8.9–10.3)
Creatinine, Ser: 1.71 mg/dL — ABNORMAL HIGH (ref 0.61–1.24)
GFR calc Af Amer: 41 mL/min — ABNORMAL LOW (ref >60)
GFR, EST NON AFRICAN AMERICAN: 35 mL/min — AB (ref >60)
GLUCOSE: 90 mg/dL (ref 70–99)
POTASSIUM: 4.8 mmol/L (ref 3.5–5.1)
SODIUM: 137 mmol/L (ref 135–145)
Total Bilirubin: 1.1 mg/dL (ref 0.3–1.2)
Total Protein: 5.8 g/dL — ABNORMAL LOW (ref 6.5–8.1)

## 2017-12-23 LAB — CBC
HCT: 34.9 % — ABNORMAL LOW (ref 39.0–52.0)
Hemoglobin: 11.1 g/dL — ABNORMAL LOW (ref 13.0–17.0)
MCH: 30.7 pg (ref 26.0–34.0)
MCHC: 31.8 g/dL (ref 30.0–36.0)
MCV: 96.4 fL (ref 78.0–100.0)
PLATELETS: 122 10*3/uL — AB (ref 150–400)
RBC: 3.62 MIL/uL — ABNORMAL LOW (ref 4.22–5.81)
RDW: 14.8 % (ref 11.5–15.5)
WBC: 8.8 10*3/uL (ref 4.0–10.5)

## 2017-12-23 LAB — MRSA PCR SCREENING: MRSA BY PCR: NEGATIVE

## 2017-12-23 SURGERY — LEFT HEART CATH AND CORONARY ANGIOGRAPHY
Anesthesia: LOCAL

## 2017-12-23 MED ORDER — HEPARIN SODIUM (PORCINE) 1000 UNIT/ML IJ SOLN
INTRAMUSCULAR | Status: AC
  Filled 2017-12-23: qty 1

## 2017-12-23 MED ORDER — HEPARIN (PORCINE) IN NACL 1000-0.9 UT/500ML-% IV SOLN
INTRAVENOUS | Status: DC | PRN
  Administered 2017-12-23 (×2): 500 mL

## 2017-12-23 MED ORDER — LIDOCAINE HCL (PF) 1 % IJ SOLN
INTRAMUSCULAR | Status: AC
  Filled 2017-12-23: qty 30

## 2017-12-23 MED ORDER — HEPARIN (PORCINE) IN NACL 100-0.45 UNIT/ML-% IJ SOLN
1300.0000 [IU]/h | INTRAMUSCULAR | Status: DC
  Administered 2017-12-23: 1300 [IU]/h via INTRAVENOUS
  Filled 2017-12-23: qty 250

## 2017-12-23 MED ORDER — HEPARIN (PORCINE) IN NACL 100-0.45 UNIT/ML-% IJ SOLN
1500.0000 [IU]/h | INTRAMUSCULAR | Status: DC
Start: 2017-12-23 — End: 2017-12-28
  Administered 2017-12-23: 1300 [IU]/h via INTRAVENOUS
  Administered 2017-12-24 – 2017-12-28 (×5): 1500 [IU]/h via INTRAVENOUS
  Filled 2017-12-23 (×7): qty 250

## 2017-12-23 MED ORDER — VERAPAMIL HCL 2.5 MG/ML IV SOLN
INTRAVENOUS | Status: AC
  Filled 2017-12-23: qty 2

## 2017-12-23 MED ORDER — LIDOCAINE HCL (PF) 1 % IJ SOLN
INTRAMUSCULAR | Status: DC | PRN
  Administered 2017-12-23: 2 mL via INTRADERMAL

## 2017-12-23 MED ORDER — SODIUM CHLORIDE 0.9 % IV SOLN
250.0000 mL | INTRAVENOUS | Status: DC | PRN
  Administered 2017-12-28: 14:00:00 via INTRAVENOUS

## 2017-12-23 MED ORDER — FINASTERIDE 5 MG PO TABS: 5.0000 mg | ORAL_TABLET | Freq: Every day | ORAL | Status: DC

## 2017-12-23 MED ORDER — SODIUM CHLORIDE 0.9% FLUSH
3.0000 mL | Freq: Two times a day (BID) | INTRAVENOUS | Status: DC
  Administered 2017-12-24 – 2017-12-27 (×6): 3 mL via INTRAVENOUS

## 2017-12-23 MED ORDER — IOHEXOL 350 MG/ML SOLN
INTRAVENOUS | Status: DC | PRN
  Administered 2017-12-23: 75 mL via INTRA_ARTERIAL

## 2017-12-23 MED ORDER — SODIUM CHLORIDE 0.9% FLUSH: 3.0000 mL | INTRAVENOUS | Status: DC | PRN

## 2017-12-23 MED ORDER — HEPARIN SODIUM (PORCINE) 1000 UNIT/ML IJ SOLN
INTRAMUSCULAR | Status: DC | PRN
  Administered 2017-12-23: 5000 [IU] via INTRAVENOUS

## 2017-12-23 MED ORDER — SODIUM CHLORIDE 0.9 % WEIGHT BASED INFUSION
1.0000 mL/kg/h | INTRAVENOUS | Status: AC
  Administered 2017-12-23: 1 mL/kg/h via INTRAVENOUS

## 2017-12-23 SURGICAL SUPPLY — 10 items
CATH 5FR JL3.5 JR4 ANG PIG MP (CATHETERS) ×1 IMPLANT
DEVICE RAD COMP TR BAND LRG (VASCULAR PRODUCTS) ×1 IMPLANT
GLIDESHEATH SLEND SS 6F .021 (SHEATH) ×1 IMPLANT
GUIDEWIRE INQWIRE 1.5J.035X260 (WIRE) IMPLANT
INQWIRE 1.5J .035X260CM (WIRE) ×4
KIT HEART LEFT (KITS) ×2 IMPLANT
PACK CARDIAC CATHETERIZATION (CUSTOM PROCEDURE TRAY) ×2 IMPLANT
TRANSDUCER W/STOPCOCK (MISCELLANEOUS) ×2 IMPLANT
TUBING CIL FLEX 10 FLL-RA (TUBING) ×2 IMPLANT
WIRE EMERALD ST .035X150CM (WIRE) ×1 IMPLANT

## 2017-12-23 NOTE — Plan of Care (Signed)
  Problem: Activity: Goal: Risk for activity intolerance will decrease Outcome: Progressing   Problem: Education: Goal: Knowledge of General Education information will improve Description Including pain rating scale, medication(s)/side effects and non-pharmacologic comfort measures Outcome: Completed/Met   Problem: Nutrition: Goal: Adequate nutrition will be maintained Outcome: Completed/Met

## 2017-12-23 NOTE — Progress Notes (Signed)
Progress Note  Patient Name: [name] Date of Encounter: 12/23/2017  Primary Cardiologist: No primary care provider on file.   Subjective   Denies any further chest pain or shortness of breath.  Inpatient Medications    Scheduled Meds: . allopurinol  300 mg Oral q morning - 10a  . amLODipine  10 mg Oral q morning - 10a  . aspirin  81 mg Oral Pre-Cath  . enoxaparin (LOVENOX) injection  40 mg Subcutaneous Q24H  . Ferrous Fumarate  106 mg of iron Oral Q breakfast   And  . iron polysaccharides  150 mg Oral Q breakfast  . gabapentin  600 mg Oral Daily  . multivitamin with minerals  1 tablet Oral Daily  . sertraline  50 mg Oral Daily  . sodium chloride flush  3 mL Intravenous Q12H  . sodium chloride flush  3 mL Intravenous Q12H  . tamsulosin  0.4 mg Oral QPC supper  . tiZANidine  2 mg Oral TID  . trimethoprim  100 mg Oral QPM  . vitamin B-12  500 mcg Oral Daily   Continuous Infusions: . sodium chloride    . sodium chloride    . sodium chloride 50 mL/hr at 12/22/17 2141   PRN Meds: sodium chloride, sodium chloride, acetaminophen, nitroGLYCERIN, ondansetron (ZOFRAN) IV, oxyCODONE, sodium chloride flush, sodium chloride flush, zolpidem   Vital Signs    Vitals:   12/22/17 2054 12/23/17 0241  BP: 139/73 100/62  Pulse: 73 64  Resp: 15 17  Temp: 98.6 F (37 C) 98.4 F (36.9 C)  TempSrc: Oral Oral  SpO2: 96% 96%  Weight:  98.1 kg    Intake/Output Summary (Last 24 hours) at 12/23/2017 0727 Last data filed at 12/23/2017 5449 Gross per 24 hour  Intake -  Output 250 ml  Net -250 ml   Filed Weights   12/23/17 0241  Weight: 98.1 kg    Telemetry    Sinus rhythm- Personally Reviewed  ECG    No new EKG to review- Personally Reviewed  Physical Exam   GEN: No acute distress.   Neck: No JVD Cardiac: RRR, no murmurs, rubs, or gallops.  Respiratory: Clear to auscultation bilaterally. GI: Soft, nontender, non-distended  MS: No edema; No deformity. Neuro:   Nonfocal  Psych: Normal affect   Labs    ChemistryNo results for input(s): NA, K, CL, CO2, GLUCOSE, BUN, CREATININE, CALCIUM, PROT, ALBUMIN, AST, ALT, ALKPHOS, BILITOT, GFRNONAA, GFRAA, ANIONGAP in the last 168 hours.   HematologyNo results for input(s): WBC, RBC, HGB, HCT, MCV, MCH, MCHC, RDW, PLT in the last 168 hours.  Cardiac EnzymesNo results for input(s): TROPONINI in the last 168 hours. No results for input(s): TROPIPOC in the last 168 hours.   BNPNo results for input(s): BNP, PROBNP in the last 168 hours.   DDimer No results for input(s): DDIMER in the last 168 hours.   Radiology    No results found.  Cardiac Studies   Nuclear stress test with inferolateral ischemia at [facility]  Patient Profile     84 y.o. male with a history of BPH, Anemia, HTN, HOH, OA, colon polyps.  He was admitted to [facility] with complaints of chest pain and shoulder pain and chest CT showed no evidence of PE but did show three-vessel coronary artery calcifications.  Nuclear stress test showed inferior lateral ischemia and he is transferred to [facility] for cath.  Assessment & Plan    1.  Chest pain with typical and atypical  sx.  -cardiac enzymes negative x 2 -EKG is nonischemic -nuclear stress test with inferolateral ischemia -Chest CT with no PE but multivessel CAD -Is not had any further anginal symptoms -Cardiac cath planned for today to find coronary anatomy -continue ASA 81mg daily, statin, heparin drip and Lopressor 25 mg twice daily  2.  HTN  -BP controlled on exam 100/62-139/73 mmHg this morning -ARB is on hold for cath -continue amlodipine 5mg daily Lopressor 25 mg twice daily  3.  Acute on CKD stage 3 -ARB on hold -Continue IV fluid hydration which is been going on all night in hopes that renal function will improve by a.m. -Be met pending this morning  For questions or updates, please contact [facility] Please consult [email or web address] for  contact info under Cardiology/STEMI.      Signed,  , MD  12/23/2017, 7:27 AM   

## 2017-12-23 NOTE — Consult Note (Addendum)
[address]       [place] [address]             [phone]        Jarrell Gassmann [place] Medical Record #[id number] Date of Birth: 12/30/1931  Referring: Dr. Martinique, MD Primary Care: Carlus Pavlov, MD Primary Cardiologist:New to [facility]  Chief Complaint:   Discomfort/pain left shoulder, mid sternal and back pain, and shortness of breath Reason for consultation: Coronary artery disease  History of Present Illness:     This is an 84 year old Caucasian male with a past medical history of chronic kidney disease, hypertension, anemia, and BPH who was in his usual state of health until yesterday. He states he was sitting in a chair and developed discomfort/pain lin his left shoulder. He used Aspercream because he thought it was positional/musculoskeletal. He then developed mid sternal and back pain, along with shortness of breath. He also said it was difficult to take in a deep breath. He denied nausea, vomiting, diaphoresis, or syncope. He initially presented to [place] was normal. A D dimer was done along with a CT scan. He ruled out for a PE but had coronary calcifications that were worrisome for coronary artery disease. In addition, a nuclear stress test showed moderate stress induced perfusion defect in the inferolateral area. At the periphery, the perfusion defect was fixed and the EF was 56%. He was transferred to [facility] for further evaluation and management.  A cardiac catheterization was done by Dr. Martinique earlier today which revealed 3 vessel disease. A cardiothoracic consultation was obtained with Dr. Servando Snare for the consideration of coronary artery bypass grafting surgery. Currently, the patient is on a Heparin drip and his vital signs are stable. He denies chest pain, back pain, or shortness of breath.   Current Activity/ Functional Status: Patient is independent with mobility/ambulation, transfers, ADL's,  IADL's.   Zubrod Score: At the time of surgery this patient's most appropriate activity status/level should be described as: []     0    Normal activity, no symptoms [x]     1    Restricted in physical strenuous activity but ambulatory, able to do out light work []     2    Ambulatory and capable of self care, unable to do work activities, up and about  more than 50%  Of the time                            []     3    Only limited self care, in bed greater than 50% of waking hours []     4    Completely disabled, no self care, confined to bed or chair []     5    Moribund  Past Medical History:  Diagnosis Date  . Anemia    takes Iron pill daily  . Arthritis   . Cancer ([place]) 02/14/13   skin cancer  . Cataract    right eye  and immature  . Chest pain with high risk for cardiac etiology, abnormal Myoview 12/22/2017  . Constipation   . Depression    no meds required  . Enlarged prostate    takes Flomax daily  . Gout    takes Allopurinol daily  . Hemorrhoids    occasionally  . History of blood transfusion    lap - colon blockage  . History of bronchitis  last time in Dec 2014  . History of colon polyps   . History of UTI    takes Trimethoprim daily  . Hypertension    takes Amlodipine and Avapro daily  . Impaired hearing    wears hearing aids  . Insomnia    takes Restoril nightly  . Joint pain   . Neuromuscular disorder ([place]) 02/14/13   damnage nerves in bil feet  . Neuropathy   . Peripheral neuropathy   . Renal calculus    large  . Shortness of breath    with exertion    Past Surgical History:  Procedure Laterality Date  . APPENDECTOMY  1964  . basal cell removed from back and face   2005  . basal cell removed from cheek and nose  2009  . basal cell removed from left ear  2014  . COLONOSCOPY  2010  . ESOPHAGOGASTRODUODENOSCOPY    . HEMORRHOID SURGERY  1970/2006  . INTRAMEDULLARY (IM) NAIL INTERTROCHANTERIC Right 02/15/2013   Procedure: INTRAMEDULLARY (IM) NAIL  INTERTROCHANTRIC;  Surgeon: Rozanna Box, MD;  Location: [place];  Service: Orthopedics;  Laterality: Right;  . JOINT REPLACEMENT Left 09   knee  . laparoscopic surgery   2006   d/t blockage in intestine   . LUMBAR LAMINECTOMY/DECOMPRESSION MICRODISCECTOMY Left 06/01/2013   Procedure: LEFT LUMBAR THREE-FOUR DECOMPRESSION LUMBAR LAMINECTOMY/MICRODISCECTOMY;POSSIBLE LUMBAR FOUR-FIVE;  Surgeon: Charlie Pitter, MD;  Location: [place] [facility];  Service: Neurosurgery;  Laterality: Left;  left  . right hip surgery  2014  . TONSILLECTOMY  1970/2006  . TOTAL KNEE ARTHROPLASTY Right 09/03/2013   Procedure: RIGHT TOTAL KNEE ARTHROPLASTY;  Surgeon: Vickey Huger, MD;  Location: [place];  Service: Orthopedics;  Laterality: Right;    Social History   Tobacco Use  Smoking Status Never Smoker    Social History   Substance and Sexual Activity  Alcohol Use No     Allergies  Allergen Reactions  . Adhesive [Tape]     redness  . Ativan [Lorazepam] Other (See Comments)    Hallucinations.  Has an adverse reaction to medication.  Disoriented.  "Mean"    Current Facility-Administered Medications  Medication Dose Route Frequency Provider Last Rate Last Dose  . 0.9 %  sodium chloride infusion  250 mL Intravenous PRN Martinique, Peter M, MD      . Derrill Memo ON 12/24/2017] 0.9 %  sodium chloride infusion  250 mL Intravenous PRN Martinique, Peter M, MD      . 0.9% sodium chloride infusion  1 mL/kg/hr Intravenous Continuous Martinique, Peter M, MD 98.1 mL/hr at 12/23/17 1547 1 mL/kg/hr at 12/23/17 1547  . acetaminophen (TYLENOL) tablet 650 mg  650 mg Oral Q4H PRN Martinique, Peter M, MD      . allopurinol (ZYLOPRIM) tablet 300 mg  300 mg Oral q morning - 10a Martinique, Peter M, MD   300 mg at 12/23/17 0902  . amLODipine (NORVASC) tablet 10 mg  10 mg Oral q morning - 10a Martinique, Peter M, MD   10 mg at 12/23/17 0901  . Ferrous Fumarate (HEMOCYTE - 106 mg FE) tablet 106 mg of iron  106 mg of iron Oral Q breakfast Martinique, Peter M, MD   106  mg of iron at 12/23/17 9371   And  . iron polysaccharides (NIFEREX) capsule 150 mg  150 mg Oral Q breakfast Martinique, Peter M, MD   150 mg at 12/23/17 6967  . [START ON 12/24/2017] finasteride (PROSCAR) tablet 5 mg  5 mg Oral  Daily Martinique, Peter M, MD      . gabapentin (NEURONTIN) tablet 600 mg  600 mg Oral Daily Martinique, Peter M, MD   600 mg at 12/22/17 2142  . multivitamin with minerals tablet 1 tablet  1 tablet Oral Daily Martinique, Peter M, MD   1 tablet at 12/23/17 0902  . nitroGLYCERIN (NITROSTAT) SL tablet 0.4 mg  0.4 mg Sublingual Q5 Min x 3 PRN Martinique, Peter M, MD      . ondansetron [facility]) injection 4 mg  4 mg Intravenous Q6H PRN Martinique, Peter M, MD      . oxyCODONE (Oxy IR/ROXICODONE) immediate release tablet 5-10 mg  5-10 mg Oral Q3H PRN Martinique, Peter M, MD      . sertraline (ZOLOFT) tablet 50 mg  50 mg Oral Daily Martinique, Peter M, MD   50 mg at 12/23/17 0902  . sodium chloride flush (NS) 0.9 % injection 3 mL  3 mL Intravenous Q12H Martinique, Peter M, MD      . sodium chloride flush (NS) 0.9 % injection 3 mL  3 mL Intravenous PRN Martinique, Peter M, MD      . Derrill Memo ON 12/24/2017] sodium chloride flush (NS) 0.9 % injection 3 mL  3 mL Intravenous Q12H Martinique, Peter M, MD      . Derrill Memo ON 12/24/2017] sodium chloride flush (NS) 0.9 % injection 3 mL  3 mL Intravenous PRN Martinique, Peter M, MD      . tamsulosin [facility]) capsule 0.4 mg  0.4 mg Oral QPC supper Martinique, Peter M, MD      . tiZANidine (ZANAFLEX) tablet 2 mg  2 mg Oral TID Martinique, Peter M, MD   2 mg at 12/22/17 2143  . trimethoprim (TRIMPEX) tablet 100 mg  100 mg Oral QPM Martinique, Peter M, MD   100 mg at 12/22/17 2143  . vitamin B-12 (CYANOCOBALAMIN) tablet 500 mcg  500 mcg Oral Daily Martinique, Peter M, MD   500 mcg at 12/23/17 1937  . zolpidem (AMBIEN) tablet 5 mg  5 mg Oral QHS PRN Martinique, Peter M, MD        Medications Prior to Admission  Medication Sig Dispense Refill Last Dose  . acetaminophen (TYLENOL) 500 MG tablet Take 1,000 mg by mouth  every 6 (six) hours as needed for pain.   12/23/2017 at Unknown time  . allopurinol (ZYLOPRIM) 300 MG tablet Take 300 mg by mouth every morning.   12/23/2017 at Unknown time  . amLODipine (NORVASC) 10 MG tablet Take 10 mg by mouth every morning.   12/23/2017 at Unknown time  . finasteride (PROSCAR) 5 MG tablet Take 5 mg by mouth daily.   12/23/2017 at Unknown time  . irbesartan (AVAPRO) 150 MG tablet Take 150 mg by mouth daily.   12/23/2017 at Unknown time  . Multiple Vitamin (MULTIVITAMIN WITH MINERALS) TABS tablet Take 1 tablet by mouth daily.   12/23/2017 at Unknown time  . sertraline (ZOLOFT) 50 MG tablet Take 50 mg by mouth daily.   12/23/2017 at Unknown time  . tamsulosin (FLOMAX) 0.4 MG CAPS capsule Take 0.4 mg by mouth daily after supper.    12/22/2017 at Unknown time  . temazepam (RESTORIL) 15 MG capsule Take 15 mg by mouth at bedtime as needed for sleep.   unk at prn  . vitamin B-12 (CYANOCOBALAMIN) 500 MCG tablet Take 500 mcg by mouth daily.   12/23/2017 at Unknown time    Family History  Problem Relation Age of Onset  .  Emphysema Mother   . Heart attack Father 23  . Rheum arthritis Sister   Patient has a son and daughter (one lives in [place] and one lives in [place]). He has been married for 68 years.  Review of Systems:      Cardiac Review of Systems: Y or  [  N  ]= no  Chest Pain [  Y  ]   Orthopnea Aqua.Slicker  ]   Pedal Edema [ N  ]    Palpitations Aqua.Slicker  ] Syncope  Aqua.Slicker  ]   Presyncope [  N ]  General Review of Systems: [Y] = yes [ N ]=no Constitional:  nausea [ N ]; night sweats [ N ]; fever [  N]; or chills Aqua.Slicker  ]                                                                 Eye : blurred vision Aqua.Slicker  ]; diplopia Aqua.Slicker   ];  Amaurosis fugax[ N ]; Resp: cough [ N ];  wheezing[ N ];  hemoptysis[ N ]; GI:  vomiting[ N ];   melena[ N ];  hematochezia Aqua.Slicker  ];  GU:  hematuria[ N ];   BPH -[Y]             Skin: , hair loss[ N ];  peripheral edema[ N ];   Heme/Lymph:  bleeding[ N ];  anemia[ Y ];               Musculoskeletal: chronic back pain [Y] Neuro: TIA[ N ];  headaches[ N ];  stroke[ N ];  vertigo[N  ];  seizures[N  ];  neuropathy [Y]  Psych:depression[ Y ];  Endocrine: diabetes[N  ];  thyroid dysfunction[ N ];         Physical Exam: BP 132/74   Pulse 61   Temp 98 F (36.7 C) (Oral)   Resp 18   Ht 5\' 10"  (1.778 m)   Wt 98.1 kg   SpO2 (!) 0%   BMI 31.04 kg/m    General appearance: alert, cooperative and no distress Head: Normocephalic, without obvious abnormality, atraumatic Neck: no carotid bruit, no JVD and supple, symmetrical, trachea midline Resp: clear to auscultation bilaterally Cardio: RRR, no murmur Extremities: No LE edema;Palpable DP/faint PTs bilaterally Neurologic: Grossly normal  Diagnostic Studies & Laboratory data: LEFT HEART CATH AND CORONARY ANGIOGRAPHY  Conclusion     Mid RCA-1 lesion is 80% stenosed.  Mid RCA-2 lesion is 70% stenosed.  Prox RCA lesion is 60% stenosed.  Prox LAD lesion is 90% stenosed.  Ost 1st Mrg to 1st Mrg lesion is 90% stenosed.  Mid Cx lesion is 100% stenosed.   1. Severe 3 vessel obstructive CAD with diffuse coronary calcification    - 90% proximal LAD at bifurcation of moderate sized diagonal branch    - 100% mid LCx occlusion. Left to left collaterals to a large OM branch    - diffuse proximal to mid RCA disease up to 80%.  Plan: will get an Echo to assess for valvular disease. Given complexity of disease I would recommend CVTS consult for potential CABG. Will resume IV heparin.      Prox LAD lesion 90% stenosed  Prox LAD lesion is 90% stenosed.  The lesion is located at the major branch. The lesion is moderately calcified.  Left Circumflex  Mid Cx lesion 100% stenosed  Mid Cx lesion is 100% stenosed. with left-to-left collateral flow. The lesion is moderately calcified.  First Obtuse Marginal Branch  Vessel is small in size.  Ost 1st Mrg to 1st Mrg lesion 90% stenosed  Ost 1st Mrg to 1st Mrg lesion is 90%  stenosed.  Second Obtuse Marginal Branch  Vessel is small in size.  Third Obtuse Marginal Branch  Vessel is large in size.  Collaterals  3rd Mrg filled by collaterals from Ost 3rd Sept.    Right Coronary Artery  Prox RCA lesion 60% stenosed  Prox RCA lesion is 60% stenosed. The lesion is moderately calcified.  Mid RCA-1 lesion 80% stenosed  Mid RCA-1 lesion is 80% stenosed. The lesion is moderately calcified.  Mid RCA-2 lesion 70% stenosed  Mid RCA-2 lesion is 70% stenosed.  Intervention   No interventions have been documented.  Coronary Diagrams   Diagnostic Diagram        Final Report  CLINICAL DATA: Chest pain  EXAM: MYOCARDIAL IMAGING WITH SPECT (REST AND EXERCISE)  GATED LEFT VENTRICULAR WALL MOTION STUDY  LEFT VENTRICULAR EJECTION FRACTION  TECHNIQUE: Standard myocardial SPECT imaging was performed after resting intravenous injection of 10 mCi Tc-13m tetrofosmin. Subsequently, exercise tolerance test was performed by the patient under the supervision of the Cardiology staff. At peak-stress, 30 mCi Tc-71m tetrofosmin was injected intravenously and standard myocardial SPECT imaging was performed. Quantitative gated imaging was also performed to evaluate left ventricular wall motion, and estimate left ventricular ejection fraction.  COMPARISON: None.  FINDINGS: Perfusion: There is a large perfusion defect encompassing the lateral wall, inferolateral region and lateral aspect of the inferior wall. At least half of the medial aspect does reperfused on rest imaging. This is compatible with a moderate-sized stress-induced perfusion defect.  Wall Motion: Normal left ventricular wall motion. No left ventricular dilation.  Left Ventricular Ejection Fraction: 56 %  End diastolic volume 95 ml  End systolic volume 42 ml  IMPRESSION: 1. Moderate-sized stress-induced perfusion defect in the inferolateral region. At the periphery, the perfusion defect  is fixed.  2. Normal left ventricular wall motion.  3. Left ventricular ejection fraction 56%  4. Non invasive risk stratification*: Intermediate risk.  *2012 Appropriate Use Criteria for Coronary Revascularization Focused Update: J Am Coll Cardiol. 2353;61(4):431-540. http://content.airportbarriers.com.aspx?articleid=1201161   Electronically Signed By: Marybelle Killings M.D. On: 12/22/2017 13:11  Principal Interpreter Name: Marybelle Killings Provider ID: [id number]    Recent Radiology Findings:   Final Report  CLINICAL DATA: Acute onset of chest pain abdominal pain associated with shortness of breath that began 3 hours ago.  EXAM: CT ANGIOGRAPHY CHEST, ABDOMEN AND PELVIS  TECHNIQUE: Initially, multidetector CT imaging through the chest was performed prior to intravenous contrast administration. Subsequently, multidetector CT imaging through the chest, abdomen and pelvis was performed using the standard protocol during bolus administration of intravenous contrast. Multiplanar reconstructed images and MIPs were obtained and reviewed to evaluate the vascular anatomy.  CONTRAST: 80 mL Isovue 370 IV.  COMPARISON: CT abdomen and pelvis 04/11/2017, 02/19/2013 and earlier. No prior CT chest.  FINDINGS: CTA CHEST FINDINGS  Cardiovascular: Unenhanced images demonstrate calcified plaque involving the thoracic aorta. No evidence of intramural hematoma.  Enhanced images demonstrate no evidence of thoracic aortic dissection. No evidence of thoracic aortic aneurysm, maximum diameter of the ascending thoracic aorta measuring approximately 3.8 cm. Atherosclerosis involving the proximal great vessels without evidence  of stenosis.  Heart size upper normal. Moderate to severe three-vessel coronary atherosclerosis. No pericardial effusion. Central pulmonary arteries patent.  Mediastinum/Nodes: Calcified normal sized RIGHT hilar and RIGHT paratracheal mediastinal lymph nodes. No  pathologically enlarged mediastinal, hilar or axillary lymph nodes. No mediastinal masses. Normal-appearing esophagus. Normal-appearing thyroid gland.  Lungs/Pleura: Minimal dependent atelectasis in the dependent lower lobes. Calcified granuloma anteriorly in the RIGHT UPPER LOBE. Lung parenchyma otherwise clear. No noncalcified pulmonary nodules. No confluent airspace consolidation. No evidence of interstitial lung disease.  Musculoskeletal: Degenerative changes and DISH throughout the thoracic spine. Remote mild compression fracture of the UPPER endplate of C4. No acute findings.  Other: Mild BILATERAL gynecomastia.  Review of the MIP images confirms the above findings.  CTA ABDOMEN AND PELVIS FINDINGS  VASCULAR  Aorta: Severe atherosclerosis without evidence of aneurysm or dissection.  Celiac: Atherosclerotic at its origin without evidence of significant stenosis.  SMA: Atherosclerotic at its origin without evidence of significant stenosis.  Renals: Single renal arteries bilaterally. High-grade stenosis at the origin of the LEFT renal artery. Atherosclerosis at the origin of the RIGHT renal artery without evidence of significant stenosis.  IMA: Widely patent.  Inflow: Mild iliofemoral atherosclerosis bilaterally without evidence of significant stenosis.  Veins: Not evaluated.  Review of the MIP images confirms the above findings.  NON-VASCULAR  Hepatobiliary: Liver normal in size. Subcentimeter benign cyst in the POSTERIOR segment RIGHT lobe. No significant focal parenchymal abnormality. Gallbladder normal in appearance without calcified gallstones. No biliary ductal dilation.  Pancreas: Normal in appearance without evidence of mass, ductal dilation, or inflammation.  Spleen: Normal in size and appearance.  Adrenals/Urinary Tract: Normal appearing adrenal glands. Cortical thinning and atrophy involving the LEFT kidney. Approximate 10.4 x 8.3 x 9.4 cm cyst  arising from the mid RIGHT kidney, unchanged since last year, slightly increased in size since 2014.. Adjacent approximate 2.5 cm mildly hyperdense cyst in the mid RIGHT kidney, unchanged dating back to at least 2014. No solid masses involving either kidney. No hydronephrosis. No urinary tract calculi. Renal sinus lipomatosis bilaterally as noted previously. Small diverticulum arising from the posterolateral walls of the bladder bilaterally.  Stomach/Bowel: Stomach normal in appearance for the degree of distention. Normal-appearing small bowel. Tortuous and elongated sigmoid colon. Diffuse colonic diverticulosis, most extensive in the distal descending and sigmoid colon, without evidence of acute diverticulitis. Colon otherwise normal in appearance. Appendix not visualized, but no evidence of pericecal inflammation.  Lymphatic: No pathologic lymphadenopathy.  Reproductive: Normal sized prostate gland for patient age. Normal-appearing seminal vesicles.  Other: None.  Musculoskeletal: Severe degenerative disc disease and spondylosis at L1-2, L2-3, L3-4 and L4-5. Desiccated disc fragment POSTERIOR to the LOWER in place of L1, new since 04/13/2017. Severe facet degenerative changes throughout the lumbar spine. Prior ORIF of a RIGHT femoral neck fracture with healing. Degenerative changes in the hip joints bilaterally.  Review of the MIP images confirms the above findings.  IMPRESSION: CTA chest:  1. No evidence of thoracic aortic aneurysm or dissection. 2. No acute cardiopulmonary disease. 3. Old granulomatous disease.  CTA Abdomen and Pelvis:  1. No evidence of abdominal aortic aneurysm or dissection. 2. High-grade stenosis involving the LEFT renal artery, likely chronic, as there is cortical thinning and atrophy involving the LEFT kidney. 3. No acute abnormalities involving the abdomen or pelvis. 4. Diffuse colonic diverticulosis, most extensive in the distal descending  and sigmoid colon, without evidence of acute diverticulitis. 5. Large approximate 10 cm benign cyst arising from the mid RIGHT kidney. 6.  Musculoskeletal findings as above, most notably a disc herniation with a desiccated disc fragment arising from the L1-2 disc which is a new finding since the November, 2018 examination.  Aortic Atherosclerosis (ICD10-170.0)   Electronically Signed By: Evangeline Dakin M.D. On: 12/21/2017 19:15  Principal Interpreter Name: Evangeline Dakin Provider ID: Joella Prince  I have independently reviewed the above radiologic studies and discussed with the patient   Recent Lab Findings: Lab Results  Component Value Date   WBC 8.8 12/23/2017   HGB 11.1 (L) 12/23/2017   HCT 34.9 (L) 12/23/2017   PLT 122 (L) 12/23/2017   GLUCOSE 90 12/23/2017   CHOL 112 12/23/2017   TRIG 52 12/23/2017   HDL 37 (L) 12/23/2017   LDLCALC 65 12/23/2017   ALT 14 12/23/2017   AST 16 12/23/2017   NA 137 12/23/2017   K 4.8 12/23/2017   CL 105 12/23/2017   CREATININE 1.71 (H) 12/23/2017   BUN 24 (H) 12/23/2017   CO2 19 (L) 12/23/2017   INR 1.17 12/23/2017   Assessment / Plan:   1. Coronary artery disease-Results as indicated above. Await echo results. On Heparin drip. Dr. Servando Snare saw and evaluated the patient. Patient wishes to proceed with coronary artery bypass grafting surgery. Will be scheduled for next Wednesday. In the meantime, will continue with pre op work up (carotid duplex, PFTs, CXR etc) 2. Acute on chronic CKD (stage III) 3. History of hypertension-on Amlodipine 10 mg daily 4. Anemia-on Ferrous fumarate and Niferex 5. History of BPH-on Tamulosin 0.4 mg at night   I  spent 20 minutes counseling the patient face to face.   Lars Pinks PA-C 12/23/2017 4:26 PM  I have seen the patient, reviewed the studies and discussed with he and his wife the option of CABG to treat his 3 vessel CAD with unstable, rest preinfarct angina  Tentative plan for  Wednesday 14 depending on renal function  I have seen and examined Guy Begin and agree with the above assessment  and plan.  Grace Isaac MD [facility] [phone] Office [phone] 12/23/2017 6:24 PM

## 2017-12-23 NOTE — Progress Notes (Signed)
ANTICOAGULATION CONSULT NOTE - Initial Consult  Pharmacy Consult for heparin Indication: chest pain/ACS  Allergies  Allergen Reactions  . Adhesive [Tape]     redness  . Ativan [Lorazepam] Other (See Comments)    Hallucinations.  Has an adverse reaction to medication.  Disoriented.  "Mean"    Patient Measurements: Height: 5\' 10"  (177.8 cm) Weight: 216 lb 4.8 oz (98.1 kg) IBW/kg (Calculated) : 73 Heparin Dosing Weight: 95kg  Vital Signs: Temp: 98.4 F (36.9 C) (08/09 0241) Temp Source: Oral (08/09 0241) BP: 100/62 (08/09 0241) Pulse Rate: 64 (08/09 0241)   Medical History: Past Medical History:  Diagnosis Date  . Anemia    takes Iron pill daily  . Arthritis   . Cancer ([facility]) 02/14/13   skin cancer  . Cataract    right eye  and immature  . Chest pain with high risk for cardiac etiology, abnormal Myoview 12/22/2017  . Constipation   . Depression    no meds required  . Enlarged prostate    takes Flomax daily  . Gout    takes Allopurinol daily  . Hemorrhoids    occasionally  . History of blood transfusion    lap - colon blockage  . History of bronchitis    last time in Dec 2014  . History of colon polyps   . History of UTI    takes Trimethoprim daily  . Hypertension    takes Amlodipine and Avapro daily  . Impaired hearing    wears hearing aids  . Insomnia    takes Restoril nightly  . Joint pain   . Neuromuscular disorder ([facility]) 02/14/13   damnage nerves in bil feet  . Neuropathy   . Peripheral neuropathy   . Renal calculus    large  . Shortness of breath    with exertion    Medications:  Medications Prior to Admission  Medication Sig Dispense Refill Last Dose  . acetaminophen (TYLENOL) 500 MG tablet Take 1,000 mg by mouth every 6 (six) hours as needed for pain.   Past Week at Unknown time  . allopurinol (ZYLOPRIM) 300 MG tablet Take 300 mg by mouth every morning.   09/03/2013 at 0630  . amLODipine (NORVASC) 10 MG tablet Take 10 mg by mouth every  morning.   09/03/2013 at 0630  . enoxaparin (LOVENOX) 40 MG/0.4ML injection Inject 0.4 mLs (40 mg total) into the skin daily. 13 Syringe 0   . ferrous fumarate-iron polysaccharide complex (TANDEM) 162-115.2 MG CAPS Take 1 capsule by mouth daily with breakfast.   09/02/2013 at Unknown time  . gabapentin (NEURONTIN) 600 MG tablet Take 600 mg by mouth daily.    09/03/2013 at 0630  . irbesartan (AVAPRO) 150 MG tablet Take 150 mg by mouth daily.   Past Week at Unknown time  . Multiple Vitamin (MULTIVITAMIN WITH MINERALS) TABS tablet Take 1 tablet by mouth daily.   Past Week at Unknown time  . oxyCODONE (OXY IR/ROXICODONE) 5 MG immediate release tablet Take 1-2 tablets (5-10 mg total) by mouth every 3 (three) hours as needed for breakthrough pain. 90 tablet 0   . sertraline (ZOLOFT) 50 MG tablet Take 50 mg by mouth daily.   09/03/2013 at 0630  . tamsulosin (FLOMAX) 0.4 MG CAPS capsule Take 0.4 mg by mouth daily after supper.    09/02/2013 at Unknown time  . temazepam (RESTORIL) 15 MG capsule Take 15 mg by mouth at bedtime as needed for sleep.   09/02/2013 at Unknown time  .  tiZANidine (ZANAFLEX) 2 MG tablet Take 1 tablet (2 mg total) by mouth 3 (three) times daily. 30 tablet 0   . trimethoprim (TRIMPEX) 100 MG tablet Take 100 mg by mouth every evening. Takes to keep from getting UTI s.   09/02/2013 at Unknown time  . vitamin B-12 (CYANOCOBALAMIN) 500 MCG tablet Take 500 mcg by mouth daily.   Past Week at Unknown time  . zolpidem (AMBIEN) 10 MG tablet Take 10 mg by mouth at bedtime as needed for sleep.   Past Week at Unknown time   Scheduled:  . allopurinol  300 mg Oral q morning - 10a  . amLODipine  10 mg Oral q morning - 10a  . aspirin  81 mg Oral Pre-Cath  . Ferrous Fumarate  106 mg of iron Oral Q breakfast   And  . iron polysaccharides  150 mg Oral Q breakfast  . gabapentin  600 mg Oral Daily  . multivitamin with minerals  1 tablet Oral Daily  . sertraline  50 mg Oral Daily  . sodium chloride flush  3  mL Intravenous Q12H  . sodium chloride flush  3 mL Intravenous Q12H  . tamsulosin  0.4 mg Oral QPC supper  . tiZANidine  2 mg Oral TID  . trimethoprim  100 mg Oral QPM  . vitamin B-12  500 mcg Oral Daily   Infusions:  . sodium chloride    . sodium chloride    . sodium chloride 50 mL/hr at 12/22/17 2141    Assessment: 84yo male c/o chest soreness radiating to back associated w/ SOB, D-dimer found to be elevated but CT negative for PE though did show coronary calcifications > nuclear stress test showed perfusion defect > transferred to [facility] for further eval, to start heparin.  Goal of Therapy:  Heparin level 0.3-0.7 units/ml Monitor platelets by anticoagulation protocol: Yes   Plan:  Received LMWH at 10p; will start heparin gtt at 1300 units/hr and monitor heparin levels and CBC.  Wynona Neat, PharmD, BCPS  12/23/2017,7:51 AM

## 2017-12-23 NOTE — H&P (View-Only) (Signed)
Progress Note  Patient Name: [name] Date of Encounter: 12/23/2017  Primary Cardiologist: No primary care provider on file.   Subjective   Denies any further chest pain or shortness of breath.  Inpatient Medications    Scheduled Meds: . allopurinol  300 mg Oral q morning - 10a  . amLODipine  10 mg Oral q morning - 10a  . aspirin  81 mg Oral Pre-Cath  . enoxaparin (LOVENOX) injection  40 mg Subcutaneous Q24H  . Ferrous Fumarate  106 mg of iron Oral Q breakfast   And  . iron polysaccharides  150 mg Oral Q breakfast  . gabapentin  600 mg Oral Daily  . multivitamin with minerals  1 tablet Oral Daily  . sertraline  50 mg Oral Daily  . sodium chloride flush  3 mL Intravenous Q12H  . sodium chloride flush  3 mL Intravenous Q12H  . tamsulosin  0.4 mg Oral QPC supper  . tiZANidine  2 mg Oral TID  . trimethoprim  100 mg Oral QPM  . vitamin B-12  500 mcg Oral Daily   Continuous Infusions: . sodium chloride    . sodium chloride    . sodium chloride 50 mL/hr at 12/22/17 2141   PRN Meds: sodium chloride, sodium chloride, acetaminophen, nitroGLYCERIN, ondansetron (ZOFRAN) IV, oxyCODONE, sodium chloride flush, sodium chloride flush, zolpidem   Vital Signs    Vitals:   12/22/17 2054 12/23/17 0241  BP: 139/73 100/62  Pulse: 73 64  Resp: 15 17  Temp: 98.6 F (37 C) 98.4 F (36.9 C)  TempSrc: Oral Oral  SpO2: 96% 96%  Weight:  98.1 kg    Intake/Output Summary (Last 24 hours) at 12/23/2017 0727 Last data filed at 12/23/2017 5449 Gross per 24 hour  Intake -  Output 250 ml  Net -250 ml   Filed Weights   12/23/17 0241  Weight: 98.1 kg    Telemetry    Sinus rhythm- Personally Reviewed  ECG    No new EKG to review- Personally Reviewed  Physical Exam   GEN: No acute distress.   Neck: No JVD Cardiac: RRR, no murmurs, rubs, or gallops.  Respiratory: Clear to auscultation bilaterally. GI: Soft, nontender, non-distended  MS: No edema; No deformity. Neuro:   Nonfocal  Psych: Normal affect   Labs    ChemistryNo results for input(s): NA, K, CL, CO2, GLUCOSE, BUN, CREATININE, CALCIUM, PROT, ALBUMIN, AST, ALT, ALKPHOS, BILITOT, GFRNONAA, GFRAA, ANIONGAP in the last 168 hours.   HematologyNo results for input(s): WBC, RBC, HGB, HCT, MCV, MCH, MCHC, RDW, PLT in the last 168 hours.  Cardiac EnzymesNo results for input(s): TROPONINI in the last 168 hours. No results for input(s): TROPIPOC in the last 168 hours.   BNPNo results for input(s): BNP, PROBNP in the last 168 hours.   DDimer No results for input(s): DDIMER in the last 168 hours.   Radiology    No results found.  Cardiac Studies   Nuclear stress test with inferolateral ischemia at [facility]  Patient Profile     84 y.o. male with a history of BPH, Anemia, HTN, HOH, OA, colon polyps.  He was admitted to [facility] with complaints of chest pain and shoulder pain and chest CT showed no evidence of PE but did show three-vessel coronary artery calcifications.  Nuclear stress test showed inferior lateral ischemia and he is transferred to [facility] for cath.  Assessment & Plan    1.  Chest pain with typical and atypical  sx.  -cardiac enzymes negative x 2 -EKG is nonischemic -nuclear stress test with inferolateral ischemia -Chest CT with no PE but multivessel CAD -Is not had any further anginal symptoms -Cardiac cath planned for today to find coronary anatomy -continue ASA 81mg daily, statin, heparin drip and Lopressor 25 mg twice daily  2.  HTN  -BP controlled on exam 100/62-139/73 mmHg this morning -ARB is on hold for cath -continue amlodipine 5mg daily Lopressor 25 mg twice daily  3.  Acute on CKD stage 3 -ARB on hold -Continue IV fluid hydration which is been going on all night in hopes that renal function will improve by a.m. -Be met pending this morning  For questions or updates, please contact [facility] Please consult [email or web address] for  contact info under Cardiology/STEMI.      Signed, Dorman Calderwood, MD  12/23/2017, 7:27 AM   

## 2017-12-23 NOTE — Progress Notes (Signed)
ANTICOAGULATION CONSULT NOTE - Follow Up Consult  Pharmacy Consult for Heparin Indication: severe 3 vessel CAD, awating CABG  Allergies  Allergen Reactions  . Adhesive [Tape] Other (See Comments)    Redness   . Ativan [Lorazepam] Other (See Comments)    Hallucinations.  Has an adverse reaction to medication.  Disoriented.  "Mean"    Patient Measurements: Height: 5\' 10"  (177.8 cm) Weight: 216 lb 4.8 oz (98.1 kg) IBW/kg (Calculated) : 73 Heparin Dosing Weight: 95 kg  Vital Signs: Temp: 98.8 F (37.1 C) (08/09 2015) Temp Source: Oral (08/09 2015) BP: 129/68 (08/09 2015) Pulse Rate: 73 (08/09 2015)  Labs: Recent Labs    12/23/17 0445 12/23/17 0853 12/23/17 1206  HGB  --   --  11.1*  HCT  --   --  34.9*  PLT  --   --  122*  LABPROT  --  14.8  --   INR  --  1.17  --   CREATININE 1.71*  --   --     Estimated Creatinine Clearance: 37.8 mL/min (A) (by C-G formula based on SCr of 1.71 mg/dL (H)).  Assessment:  84yo male admitted on 8/8 c/o chest soreness radiating to back associated w/ SOB, D-dimer found to be elevated but CT negative for PE though did show coronary calcifications > nuclear stress test showed perfusion defect > transferred to [facility] for further evaluation. Lovenox 40 mg sq given 8/8 pm.  Changed to IV heparin this am, then held for cardiac cath before heparin level was due.    Cardiac cath showed severe 3 vessel CAD.  IV heparin to resume 8 hrs after sheath out. Sheath out at 1519.  No bleeding or hematoma reported.  Goal of Therapy:  Heparin level 0.3-0.7 units/ml Monitor platelets by anticoagulation protocol: Yes   Plan:   Resume heparin drip ~11:30pm at 1300 units/hr.   Heparin level and CBC ~8hrs after drip resumes and daily while on heparin.  CABG tentatively planned for 8/14.  Arty Baumgartner , [place] Pager: [phone] 12/23/2017,8:32 PM

## 2017-12-23 NOTE — Interval H&P Note (Signed)
History and Physical Interval Note:  12/23/2017 2:40 PM  [name]  has presented today for surgery, with the diagnosis of chest pain  The various methods of treatment have been discussed with the patient and family. After consideration of risks, benefits and other options for treatment, the patient has consented to  Procedure(s): LEFT HEART CATH AND CORONARY ANGIOGRAPHY (N/A) as a surgical intervention .  The patient's history has been reviewed, patient examined, no change in status, stable for surgery.  I have reviewed the patient's chart and labs.  Questions were answered to the patient's satisfaction.   Cath Lab Visit (complete for each Cath Lab visit)  Clinical Evaluation Leading to the Procedure:   ACS: Yes.    Non-ACS:    Anginal Classification: CCS III  Anti-ischemic medical therapy: Maximal Therapy (2 or more classes of medications)  Non-Invasive Test Results: Intermediate-risk stress test findings: cardiac mortality 1-3%/year  Prior CABG: No previous CABG        [name] [facility] 12/23/2017 2:41 PM'

## 2017-12-24 ENCOUNTER — Inpatient Hospital Stay (HOSPITAL_COMMUNITY): Payer: Medicare Other

## 2017-12-24 DIAGNOSIS — I2583 Coronary atherosclerosis due to lipid rich plaque: Secondary | ICD-10-CM

## 2017-12-24 DIAGNOSIS — I340 Nonrheumatic mitral (valve) insufficiency: Secondary | ICD-10-CM

## 2017-12-24 DIAGNOSIS — E785 Hyperlipidemia, unspecified: Secondary | ICD-10-CM

## 2017-12-24 DIAGNOSIS — I2510 Atherosclerotic heart disease of native coronary artery without angina pectoris: Secondary | ICD-10-CM

## 2017-12-24 DIAGNOSIS — Z01810 Encounter for preprocedural cardiovascular examination: Secondary | ICD-10-CM

## 2017-12-24 LAB — BASIC METABOLIC PANEL
Anion gap: 8 (ref 5–15)
BUN: 21 mg/dL (ref 8–23)
CHLORIDE: 106 mmol/L (ref 98–111)
CO2: 22 mmol/L (ref 22–32)
CREATININE: 1.67 mg/dL — AB (ref 0.61–1.24)
Calcium: 8.3 mg/dL — ABNORMAL LOW (ref 8.9–10.3)
GFR calc Af Amer: 42 mL/min — ABNORMAL LOW (ref >60)
GFR calc non Af Amer: 36 mL/min — ABNORMAL LOW (ref >60)
GLUCOSE: 92 mg/dL (ref 70–99)
Potassium: 4.5 mmol/L (ref 3.5–5.1)
Sodium: 136 mmol/L (ref 135–145)

## 2017-12-24 LAB — SURGICAL PCR SCREEN
MRSA, PCR: NEGATIVE
Staphylococcus aureus: NEGATIVE

## 2017-12-24 LAB — ECHOCARDIOGRAM COMPLETE
Height: 70 in
WEIGHTICAEL: 3536 [oz_av]

## 2017-12-24 LAB — CBC
HCT: 34.8 % — ABNORMAL LOW (ref 39.0–52.0)
Hemoglobin: 11.3 g/dL — ABNORMAL LOW (ref 13.0–17.0)
MCH: 30.5 pg (ref 26.0–34.0)
MCHC: 32.5 g/dL (ref 30.0–36.0)
MCV: 93.8 fL (ref 78.0–100.0)
PLATELETS: 128 10*3/uL — AB (ref 150–400)
RBC: 3.71 MIL/uL — ABNORMAL LOW (ref 4.22–5.81)
RDW: 14.5 % (ref 11.5–15.5)
WBC: 8.8 10*3/uL (ref 4.0–10.5)

## 2017-12-24 LAB — HEMOGLOBIN A1C
Hgb A1c MFr Bld: 5.8 % — ABNORMAL HIGH (ref 4.8–5.6)
Mean Plasma Glucose: 119.76 mg/dL

## 2017-12-24 LAB — HEPARIN LEVEL (UNFRACTIONATED)
Heparin Unfractionated: 0.2 IU/mL — ABNORMAL LOW (ref 0.30–0.70)
Heparin Unfractionated: 0.39 IU/mL (ref 0.30–0.70)

## 2017-12-24 MED ORDER — HEPARIN BOLUS VIA INFUSION
1400.0000 [IU] | Freq: Once | INTRAVENOUS | Status: AC
  Administered 2017-12-24: 1400 [IU] via INTRAVENOUS
  Filled 2017-12-24: qty 1400

## 2017-12-24 MED ORDER — METOPROLOL TARTRATE 12.5 MG HALF TABLET
12.5000 mg | ORAL_TABLET | Freq: Two times a day (BID) | ORAL | Status: DC
  Administered 2017-12-24 – 2017-12-27 (×8): 12.5 mg via ORAL
  Filled 2017-12-24 (×8): qty 1

## 2017-12-24 NOTE — Progress Notes (Signed)
Progress Note  Patient Name: [name] Date of Encounter: 12/24/2017  Primary Cardiologist: No primary care provider on file.   Subjective   No further CP or SOB since admission.  Cath with severe 3v ASCAD.  Inpatient Medications    Scheduled Meds: . allopurinol  300 mg Oral q morning - 10a  . amLODipine  10 mg Oral q morning - 10a  . Ferrous Fumarate  106 mg of iron Oral Q breakfast   And  . iron polysaccharides  150 mg Oral Q breakfast  . finasteride  5 mg Oral Daily  . gabapentin  600 mg Oral Daily  . heparin  1,400 Units Intravenous Once  . multivitamin with minerals  1 tablet Oral Daily  . sertraline  50 mg Oral Daily  . sodium chloride flush  3 mL Intravenous Q12H  . sodium chloride flush  3 mL Intravenous Q12H  . tamsulosin  0.4 mg Oral QPC supper  . vitamin B-12  500 mcg Oral Daily   Continuous Infusions: . sodium chloride    . sodium chloride    . heparin 1,300 Units/hr (12/24/17 0510)   PRN Meds: sodium chloride, sodium chloride, acetaminophen, nitroGLYCERIN, ondansetron (ZOFRAN) IV, oxyCODONE, sodium chloride flush, sodium chloride flush, zolpidem   Vital Signs    Vitals:   12/23/17 1752 12/23/17 1814 12/23/17 2015 12/24/17 0454  BP: (!) 128/107 120/69 129/68 (!) 130/57  Pulse:   73 73  Resp:   16 18  Temp:   98.8 F (37.1 C) 98.5 F (36.9 C)  TempSrc:   Oral Oral  SpO2:   95% 94%  Weight:    100.2 kg  Height:        Intake/Output Summary (Last 24 hours) at 12/24/2017 0922 Last data filed at 12/24/2017 0510 Gross per 24 hour  Intake 1555.93 ml  Output 1450 ml  Net 105.93 ml   Filed Weights   12/23/17 0241 12/24/17 0454  Weight: 98.1 kg 100.2 kg    Telemetry    Sinus rhythm- Personally Reviewed  ECG    No new EKG to review- Personally Reviewed  Physical Exam   GEN: No acute distress.   Neck: No JVD Cardiac: RRR, no murmurs, rubs, or gallops.  Respiratory: Clear to auscultation bilaterally. GI: Soft, nontender, non-distended    MS: No edema; No deformity. Neuro:  Nonfocal  Psych: Normal affect   Labs    Chemistry Recent Labs  Lab 12/23/17 0445 12/24/17 0317  NA 137 136  K 4.8 4.5  CL 105 106  CO2 19* 22  GLUCOSE 90 92  BUN 24* 21  CREATININE 1.71* 1.67*  CALCIUM 8.5* 8.3*  PROT 5.8*  --   ALBUMIN 3.2*  --   AST 16  --   ALT 14  --   ALKPHOS 48  --   BILITOT 1.1  --   GFRNONAA 35* 36*  GFRAA 41* 42*  ANIONGAP 13 8     Hematology Recent Labs  Lab 12/23/17 1206 12/24/17 0317  WBC 8.8 8.8  RBC 3.62* 3.71*  HGB 11.1* 11.3*  HCT 34.9* 34.8*  MCV 96.4 93.8  MCH 30.7 30.5  MCHC 31.8 32.5  RDW 14.8 14.5  PLT 122* 128*    Cardiac EnzymesNo results for input(s): TROPONINI in the last 168 hours. No results for input(s): TROPIPOC in the last 168 hours.   BNPNo results for input(s): BNP, PROBNP in the last 168 hours.   DDimer No results for input(s): DDIMER in the  last 168 hours.   Radiology    Dg Chest 2 View  Result Date: 12/24/2017 CLINICAL DATA:  Preoperative x-ray. EXAM: CHEST - 2 VIEW COMPARISON:  December 21, 2017 FINDINGS: Small pleural effusions, left greater than right. The heart, hila, mediastinum, lungs, and pleura are otherwise normal. IMPRESSION: Small pleural effusions, left greater than right.  No other change. Electronically Signed   By: Dorise Bullion III M.D   On: 12/24/2017 07:51    Cardiac Studies   Nuclear stress test with inferolateral ischemia at [facility]  Cardiac Cath 12/23/2017 Conclusion     Mid RCA-1 lesion is 80% stenosed.  Mid RCA-2 lesion is 70% stenosed.  Prox RCA lesion is 60% stenosed.  Prox LAD lesion is 90% stenosed.  Ost 1st Mrg to 1st Mrg lesion is 90% stenosed.  Mid Cx lesion is 100% stenosed.   1. Severe 3 vessel obstructive CAD with diffuse coronary calcification    - 90% proximal LAD at bifurcation of moderate sized diagonal branch    - 100% mid LCx occlusion. Left to left collaterals to a large OM branch    - diffuse  proximal to mid RCA disease up to 80%.  Plan: will get an Echo to assess for valvular disease. Given complexity of disease I would recommend CVTS consult for potential CABG. Will resume IV heparin.      Patient Profile     84 y.o. male with a history of BPH, Anemia, HTN, HOH, OA, colon polyps.  He was admitted to [facility] with complaints of chest pain and shoulder pain and chest CT showed no evidence of PE but did show three-vessel coronary artery calcifications.  Nuclear stress test showed inferior lateral ischemia and he is transferred to [facility] for cath.  Assessment & Plan    1.  Chest pain with typical and atypical sx.  -cardiac enzymes negative x 2 -EKG is nonischemic -nuclear stress test with inferolateral ischemia -Chest CT with no PE but multivessel CAD -Cardiac cath showed severe 3v ASCAD with 80/70% mid RCA, prox 60% RCA, 90% pLAD, 90% ostial OM1 and occluded mLCx -Seen by CVTS and plan is for CABG on Wed -2D echo pending -continue ASA 81mg  daily, statin, heparin drip  -add Lopressor 12.5 mg twice daily  2.  HTN  -BP controlled on exam at 130/68mmHg -ARB is on hold for acute on CKD -continue amlodipine 10mg  daily  3.  Acute on CKD stage 3 -ARB on hold -Creatinine slightly improved at 1.67 (1.71>1.67)  4.  Hyperlipidemia -LDL goal < 70 -continue statin -LDL 65 this admi   For questions or updates, please contact [place] Please consult [email or web address] for contact info under Cardiology/STEMI.      Signed, Fransico Him, MD  12/24/2017, 9:22 AM

## 2017-12-24 NOTE — Progress Notes (Addendum)
ANTICOAGULATION CONSULT NOTE - Follow Up Consult  Pharmacy Consult for Heparin Indication: severe 3 vessel CAD, awating CABG  Allergies  Allergen Reactions  . Adhesive [Tape] Other (See Comments)    Redness   . Ativan [Lorazepam] Other (See Comments)    Hallucinations.  Has an adverse reaction to medication.  Disoriented.  "Mean"    Patient Measurements: Height: 5\' 10"  (177.8 cm) Weight: 221 lb (100.2 kg) IBW/kg (Calculated) : 73 Heparin Dosing Weight: 95 kg  Vital Signs: Temp: 98.5 F (36.9 C) (08/10 0454) Temp Source: Oral (08/10 0454) BP: 130/57 (08/10 0454) Pulse Rate: 73 (08/10 0454)  Labs: Recent Labs    12/23/17 0445 12/23/17 0853 12/23/17 1206 12/24/17 0317 12/24/17 0655  HGB  --   --  11.1* 11.3*  --   HCT  --   --  34.9* 34.8*  --   PLT  --   --  122* 128*  --   LABPROT  --  14.8  --   --   --   INR  --  1.17  --   --   --   HEPARINUNFRC  --   --   --   --  0.20*  CREATININE 1.71*  --   --  1.67*  --     Estimated Creatinine Clearance: 39.1 mL/min (A) (by C-G formula based on SCr of 1.67 mg/dL (H)).  Assessment: 84yo male admitted on 8/8 c/o chest soreness radiating to back associated w/ SOB, s/p cath. Not on anticoagulation PTA. Scheduled for CABG 8/14.  Heparin level subtherapeutic at 0.2 on 1300 units/hr. Hgb and plts low but stable. No bleeding or hematoma reported.  Goal of Therapy:  Heparin level 0.3-0.7 units/ml Monitor platelets by anticoagulation protocol: Yes   Plan:  Heparin 1400 unit bolus Increase heparin gtt to 1500 units/hr Check heparin level in 8 hours Monitor daily CBC, heparin level, and s/sx bleeding F/u plans post CABG   Juanell Fairly, PharmD PGY1 Pharmacy Resident Phone [phone] 12/24/2017 8:19 AM    I discussed / reviewed the pharmacy note by Dr. Wilkie Aye and I agree with the resident's findings and plans as documented. Sammie Schermerhorn S. Alford [place], PharmD, [place] Clinical Staff Pharmacist Pager [phone]

## 2017-12-24 NOTE — Plan of Care (Signed)
  Problem: Clinical Measurements: Goal: Respiratory complications will improve Outcome: Progressing   Problem: Coping: Goal: Level of anxiety will decrease Outcome: Completed/Met

## 2017-12-24 NOTE — Progress Notes (Signed)
ANTICOAGULATION CONSULT NOTE - Follow Up Consult  Pharmacy Consult for Heparin Indication: severe 3 vessel CAD, awating CABG  Allergies  Allergen Reactions  . Adhesive [Tape] Other (See Comments)    Redness   . Ativan [Lorazepam] Other (See Comments)    Hallucinations.  Has an adverse reaction to medication.  Disoriented.  "Mean"    Patient Measurements: Height: 5\' 10"  (177.8 cm) Weight: 221 lb (100.2 kg) IBW/kg (Calculated) : 73 Heparin Dosing Weight: 95 kg  Vital Signs: Temp: 98 F (36.7 C) (08/10 1402) Temp Source: Oral (08/10 1402) BP: 147/71 (08/10 1402) Pulse Rate: 70 (08/10 1402)  Labs: Recent Labs    12/23/17 0445 12/23/17 0853 12/23/17 1206 12/24/17 0317 12/24/17 0655 12/24/17 1644  HGB  --   --  11.1* 11.3*  --   --   HCT  --   --  34.9* 34.8*  --   --   PLT  --   --  122* 128*  --   --   LABPROT  --  14.8  --   --   --   --   INR  --  1.17  --   --   --   --   HEPARINUNFRC  --   --   --   --  0.20* 0.39  CREATININE 1.71*  --   --  1.67*  --   --     Estimated Creatinine Clearance: 39.1 mL/min (A) (by C-G formula based on SCr of 1.67 mg/dL (H)).  Assessment: 84 yo male w/ 3 vessel CAD, s/p cath. Scheduled for CABG 8/14. Not on anticoagulation PTA.    Heparin level therapeutic at 0.39. Hgb and plts low but stable. No bleeding or hematoma reported.  Goal of Therapy:  Heparin level 0.3-0.7 units/ml Monitor platelets by anticoagulation protocol: Yes   Plan:  Continue heparin gtt at 1500 units/hr Monitor daily CBC, heparin level, and s/sx bleeding F/u plans post CABG  Vertis Kelch, PharmD PGY1 Pharmacy Resident Phone [phone] 12/24/2017       5:46 PM

## 2017-12-24 NOTE — Progress Notes (Signed)
Pre-op Cardiac Surgery  Carotid Findings:  Bilateral - 1% to 39% ICA stenosis. Vertebral artery flow is antegrade.  Upper Extremity Right Left  Brachial Pressures 147 Triphasic 144 Triphasic  Radial Waveforms Triphasic Triphasic  Ulnar Waveforms Triphasic Triphasic  Palmar Arch (Allen's Test) Normal Normal   Findings:  Palmar arch evaluation - Doppler waveforms remained normal bilaterally with both radial and ulnar compressions.    Lower  Extremity Right Left  Dorsalis Pedis 150 Biphasic 156 Triphasic  Posterior Tibial 157 Biphasic 159 Biphasic  Ankle/Brachial Indices 1.07 1.08    Findings:  ABIs indicate normal arterial flow bilaterally at rest. Unable to obtain TBIs due to restless leg and movement of the feet  [place] Bernita Beckstrom,RVS 12/24/2017, 12:34 PM

## 2017-12-24 NOTE — Progress Notes (Signed)
  Echocardiogram 2D Echocardiogram has been performed.  Darlina Sicilian M 12/24/2017, 10:12 AM

## 2017-12-24 NOTE — Progress Notes (Signed)
CARDIAC REHAB PHASE I   PRE:  Rate/Rhythm: 81 SR    BP: sitting 143/74    SaO2:   MODE:  Ambulation: 430 ft   POST:  Rate/Rhythm: 109 ST    BP: sitting 150/74     SaO2:   Pt ambulated hallway, has slight imbalance due to uneven hips. He normally wears a shoe insert. Some SOB but no CP. Feels well. To recliner. Discussed sternal precautions, mobility post op, d/c planning and IS. Pt sts wife is in fairly good health and they will be able to manage at d/c. Pt was able to inspire greater than 2500 mL on IS. Left ed materials and videos to watch. He would like to watch them Monday with his wife. Encouraged ambulation this weekend. [place], ACSM 12/24/2017 10:43 AM

## 2017-12-25 LAB — CBC
HCT: 33.7 % — ABNORMAL LOW (ref 39.0–52.0)
Hemoglobin: 10.8 g/dL — ABNORMAL LOW (ref 13.0–17.0)
MCH: 29.9 pg (ref 26.0–34.0)
MCHC: 32 g/dL (ref 30.0–36.0)
MCV: 93.4 fL (ref 78.0–100.0)
PLATELETS: 127 10*3/uL — AB (ref 150–400)
RBC: 3.61 MIL/uL — ABNORMAL LOW (ref 4.22–5.81)
RDW: 14.4 % (ref 11.5–15.5)
WBC: 7.8 10*3/uL (ref 4.0–10.5)

## 2017-12-25 LAB — HEPARIN LEVEL (UNFRACTIONATED): Heparin Unfractionated: 0.47 IU/mL (ref 0.30–0.70)

## 2017-12-25 MED ORDER — ATORVASTATIN CALCIUM 40 MG PO TABS
40.0000 mg | ORAL_TABLET | Freq: Every day | ORAL | Status: DC
  Administered 2017-12-25 – 2018-01-02 (×9): 40 mg via ORAL
  Filled 2017-12-25: qty 1
  Filled 2017-12-25: qty 2
  Filled 2017-12-25 (×7): qty 1

## 2017-12-25 NOTE — Progress Notes (Addendum)
Progress Note  Patient Name: [name] Date of Encounter: 12/25/2017  Primary Cardiologist: No primary care provider on file.   Subjective   Denies any chest pain or SOB today.  Cath with 3V CAD with plans for CABG Wednesday  Inpatient Medications    Scheduled Meds: . allopurinol  300 mg Oral q morning - 10a  . amLODipine  10 mg Oral q morning - 10a  . metoprolol tartrate  12.5 mg Oral BID  . multivitamin with minerals  1 tablet Oral Daily  . sertraline  50 mg Oral Daily  . sodium chloride flush  3 mL Intravenous Q12H  . sodium chloride flush  3 mL Intravenous Q12H  . tamsulosin  0.4 mg Oral QPC supper  . vitamin B-12  500 mcg Oral Daily   Continuous Infusions: . sodium chloride    . sodium chloride    . heparin 1,500 Units/hr (12/24/17 1338)   PRN Meds: sodium chloride, sodium chloride, acetaminophen, nitroGLYCERIN, ondansetron (ZOFRAN) IV, oxyCODONE, sodium chloride flush, sodium chloride flush, zolpidem   Vital Signs    Vitals:   12/24/17 1402 12/24/17 2044 12/25/17 0509 12/25/17 1000  BP: (!) 147/71 (!) 155/61 131/76 134/77  Pulse: 70 68 (!) 59   Resp: 16 19 19    Temp: 98 F (36.7 C) 98.7 F (37.1 C) 98.1 F (36.7 C)   TempSrc: Oral Oral Oral   SpO2: 98% 94% 98%   Weight:   96.7 kg   Height:        Intake/Output Summary (Last 24 hours) at 12/25/2017 1200 Last data filed at 12/25/2017 1000 Gross per 24 hour  Intake 773.12 ml  Output 2450 ml  Net -1676.88 ml   Filed Weights   12/23/17 0241 12/24/17 0454 12/25/17 0509  Weight: 98.1 kg 100.2 kg 96.7 kg    Telemetry    NSR- Personally Reviewed  ECG    No new EKG to review- Personally Reviewed  Physical Exam   GEN: Well nourished, well developed in no acute distress HEENT: Normal NECK: No JVD; No carotid bruits LYMPHATICS: No lymphadenopathy CARDIAC:RRR, no murmurs, rubs, gallops RESPIRATORY:  Clear to auscultation without rales, wheezing or rhonchi  ABDOMEN: Soft, non-tender,  non-distended MUSCULOSKELETAL:  No edema; No deformity  SKIN: Warm and dry NEUROLOGIC:  Alert and oriented x 3 PSYCHIATRIC:  Normal affect    Labs    Chemistry Recent Labs  Lab 12/23/17 0445 12/24/17 0317  NA 137 136  K 4.8 4.5  CL 105 106  CO2 19* 22  GLUCOSE 90 92  BUN 24* 21  CREATININE 1.71* 1.67*  CALCIUM 8.5* 8.3*  PROT 5.8*  --   ALBUMIN 3.2*  --   AST 16  --   ALT 14  --   ALKPHOS 48  --   BILITOT 1.1  --   GFRNONAA 35* 36*  GFRAA 41* 42*  ANIONGAP 13 8     Hematology Recent Labs  Lab 12/23/17 1206 12/24/17 0317 12/25/17 0423  WBC 8.8 8.8 7.8  RBC 3.62* 3.71* 3.61*  HGB 11.1* 11.3* 10.8*  HCT 34.9* 34.8* 33.7*  MCV 96.4 93.8 93.4  MCH 30.7 30.5 29.9  MCHC 31.8 32.5 32.0  RDW 14.8 14.5 14.4  PLT 122* 128* 127*    Cardiac EnzymesNo results for input(s): TROPONINI in the last 168 hours. No results for input(s): TROPIPOC in the last 168 hours.   BNPNo results for input(s): BNP, PROBNP in the last 168 hours.   DDimer No results  for input(s): DDIMER in the last 168 hours.   Radiology    Dg Chest 2 View  Result Date: 12/24/2017 CLINICAL DATA:  Preoperative x-ray. EXAM: CHEST - 2 VIEW COMPARISON:  December 21, 2017 FINDINGS: Small pleural effusions, left greater than right. The heart, hila, mediastinum, lungs, and pleura are otherwise normal. IMPRESSION: Small pleural effusions, left greater than right.  No other change. Electronically Signed   By: Dorise Bullion III M.D   On: 12/24/2017 07:51    Cardiac Studies   Nuclear stress test with inferolateral ischemia at [facility]  Cardiac Cath 12/23/2017 Conclusion     Mid RCA-1 lesion is 80% stenosed.  Mid RCA-2 lesion is 70% stenosed.  Prox RCA lesion is 60% stenosed.  Prox LAD lesion is 90% stenosed.  Ost 1st Mrg to 1st Mrg lesion is 90% stenosed.  Mid Cx lesion is 100% stenosed.   1. Severe 3 vessel obstructive CAD with diffuse coronary calcification    - 90% proximal LAD at  bifurcation of moderate sized diagonal branch    - 100% mid LCx occlusion. Left to left collaterals to a large OM branch    - diffuse proximal to mid RCA disease up to 80%.  Plan: will get an Echo to assess for valvular disease. Given complexity of disease I would recommend CVTS consult for potential CABG. Will resume IV heparin.      Patient Profile     84 y.o. male with a history of BPH, Anemia, HTN, HOH, OA, colon polyps.  He was admitted to [facility] with complaints of chest pain and shoulder pain and chest CT showed no evidence of PE but did show three-vessel coronary artery calcifications.  Nuclear stress test showed inferior lateral ischemia and he is transferred to [facility] for cath.  Assessment & Plan    1.  Chest pain with typical and atypical sx.  -cardiac enzymes negative x 2 -EKG is nonischemic -nuclear stress test with inferolateral ischemia -Chest CT with no PE but multivessel CAD -Cardiac cath showed severe 3v ASCAD with 80/70% mid RCA, prox 60% RCA, 90% pLAD, 90% ostial OM1 and occluded mLCx -he denies any CP or SOB -2D echo showed low normal LVF with EF 50-55% with no RWMAs and G1DD -continue ASA 81mg  daily, statin, heparin drip, Lopressor 12.5 mg twice daily  2.  HTN  -BP controlled on exam at 134/3mmHg -ARB is on hold for acute on CKD -continue amlodipine 10mg  daily and lopressor 12.5mg  BID  3.  Acute on CKD stage 3 -ARB on hold -Creatinine slightly improved at 1.67 (1.71>1.67) -check BMET today  4.  Hyperlipidemia -LDL goal < 70 -LDL 65 this admit -start Lipitor 40mg  daily -will need FLP and ALT in 6 weeks   For questions or updates, please contact [place] Please consult [email or web address] for contact info under Cardiology/STEMI.      Signed, Fransico Him, MD  12/25/2017, 12:00 PM

## 2017-12-25 NOTE — Plan of Care (Signed)
  Problem: Health Behavior/Discharge Planning: Goal: Ability to manage health-related needs will improve Outcome: Progressing   

## 2017-12-25 NOTE — Progress Notes (Signed)
Pt experienced experienced episode of chest discomfort/burning/pain rated 2/10. EKG obtained. SL nitro given x1 with relief. VSS. Will continue to monitor patient closely.

## 2017-12-25 NOTE — Progress Notes (Signed)
ANTICOAGULATION CONSULT NOTE - Follow Up Consult  Pharmacy Consult for Heparin Indication: severe 3 vessel CAD, awating CABG  Allergies  Allergen Reactions  . Adhesive [Tape] Other (See Comments)    Redness   . Ativan [Lorazepam] Other (See Comments)    Hallucinations.  Has an adverse reaction to medication.  Disoriented.  "Mean"    Patient Measurements: Height: 5\' 10"  (177.8 cm) Weight: 213 lb 3.2 oz (96.7 kg) IBW/kg (Calculated) : 73 Heparin Dosing Weight: 95 kg  Vital Signs: Temp: 98.1 F (36.7 C) (08/11 0509) Temp Source: Oral (08/11 0509) BP: 134/77 (08/11 1000) Pulse Rate: 59 (08/11 0509)  Labs: Recent Labs    12/23/17 0445 12/23/17 0853  12/23/17 1206 12/24/17 0317 12/24/17 0655 12/24/17 1644 12/25/17 0423  HGB  --   --    < > 11.1* 11.3*  --   --  10.8*  HCT  --   --   --  34.9* 34.8*  --   --  33.7*  PLT  --   --   --  122* 128*  --   --  127*  LABPROT  --  14.8  --   --   --   --   --   --   INR  --  1.17  --   --   --   --   --   --   HEPARINUNFRC  --   --   --   --   --  0.20* 0.39 0.47  CREATININE 1.71*  --   --   --  1.67*  --   --   --    < > = values in this interval not displayed.    Estimated Creatinine Clearance: 38.4 mL/min (A) (by C-G formula based on SCr of 1.67 mg/dL (H)).  Assessment: 84 yo male w/ 3 vessel CAD, s/p cath. Scheduled for CABG 8/14. Not on anticoagulation PTA.  Heparin level therapeutic on 1500 units/hr. Hgb and plts low but stable. No bleeding or hematoma reported.  Goal of Therapy:  Heparin level 0.3-0.7 units/ml Monitor platelets by anticoagulation protocol: Yes   Plan:  Continue heparin gtt at 1500 units/hr Monitor daily CBC, heparin level, and s/sx bleeding F/u plans post CABG  [facility], Pharm.D., BCPS Clinical Pharmacist Clinical phone for 12/25/2017 from 8:30-4:00 is [phone].  **Pharmacist phone directory can now be found on [email or web address] ([email or web address]).  Listed under [place].  12/25/2017 11:59 AM

## 2017-12-26 ENCOUNTER — Inpatient Hospital Stay (HOSPITAL_COMMUNITY): Payer: Medicare Other

## 2017-12-26 ENCOUNTER — Encounter (HOSPITAL_COMMUNITY): Payer: Self-pay | Admitting: Cardiology

## 2017-12-26 LAB — PULMONARY FUNCTION TEST
FEF 25-75 Post: 3.13 L/sec
FEF 25-75 Pre: 1.79 L/sec
FEF2575-%Change-Post: 75 %
FEF2575-%Pred-Post: 179 %
FEF2575-%Pred-Pre: 102 %
FEV1-%Change-Post: 12 %
FEV1-%Pred-Post: 70 %
FEV1-%Pred-Pre: 62 %
FEV1-Post: 1.89 L
FEV1-Pre: 1.68 L
FEV1FVC-%Change-Post: 5 %
FEV1FVC-%Pred-Pre: 112 %
FEV6-%Change-Post: 12 %
FEV6-%Pred-Post: 62 %
FEV6-%Pred-Pre: 56 %
FEV6-Post: 2.24 L
FEV6-Pre: 1.99 L
FEV6FVC-%Pred-Post: 107 %
FEV6FVC-%Pred-Pre: 107 %
FVC-%Change-Post: 6 %
FVC-%Pred-Post: 58 %
FVC-%Pred-Pre: 54 %
FVC-Post: 2.24 L
FVC-Pre: 2.1 L
Post FEV1/FVC ratio: 85 %
Post FEV6/FVC ratio: 100 %
Pre FEV1/FVC ratio: 80 %
Pre FEV6/FVC Ratio: 100 %

## 2017-12-26 LAB — CBC
HCT: 35.5 % — ABNORMAL LOW (ref 39.0–52.0)
Hemoglobin: 11.3 g/dL — ABNORMAL LOW (ref 13.0–17.0)
MCH: 30.2 pg (ref 26.0–34.0)
MCHC: 31.8 g/dL (ref 30.0–36.0)
MCV: 94.9 fL (ref 78.0–100.0)
PLATELETS: 167 10*3/uL (ref 150–400)
RBC: 3.74 MIL/uL — AB (ref 4.22–5.81)
RDW: 14.4 % (ref 11.5–15.5)
WBC: 12.6 10*3/uL — ABNORMAL HIGH (ref 4.0–10.5)

## 2017-12-26 LAB — COMPREHENSIVE METABOLIC PANEL
ALT: 18 U/L (ref 0–44)
AST: 18 U/L (ref 15–41)
Albumin: 3.3 g/dL — ABNORMAL LOW (ref 3.5–5.0)
Alkaline Phosphatase: 54 U/L (ref 38–126)
Anion gap: 8 (ref 5–15)
BUN: 28 mg/dL — ABNORMAL HIGH (ref 8–23)
CO2: 23 mmol/L (ref 22–32)
Calcium: 8.4 mg/dL — ABNORMAL LOW (ref 8.9–10.3)
Chloride: 105 mmol/L (ref 98–111)
Creatinine, Ser: 1.77 mg/dL — ABNORMAL HIGH (ref 0.61–1.24)
GFR calc Af Amer: 39 mL/min — ABNORMAL LOW (ref >60)
GFR calc non Af Amer: 34 mL/min — ABNORMAL LOW (ref >60)
Glucose, Bld: 126 mg/dL — ABNORMAL HIGH (ref 70–99)
Potassium: 4.6 mmol/L (ref 3.5–5.1)
Sodium: 136 mmol/L (ref 135–145)
Total Bilirubin: 1 mg/dL (ref 0.3–1.2)
Total Protein: 6.4 g/dL — ABNORMAL LOW (ref 6.5–8.1)

## 2017-12-26 LAB — HEPARIN LEVEL (UNFRACTIONATED): Heparin Unfractionated: 0.42 IU/mL (ref 0.30–0.70)

## 2017-12-26 LAB — GLUCOSE, CAPILLARY: Glucose-Capillary: 116 mg/dL — ABNORMAL HIGH (ref 70–99)

## 2017-12-26 MED ORDER — ASPIRIN EC 81 MG PO TBEC
81.0000 mg | DELAYED_RELEASE_TABLET | Freq: Every day | ORAL | Status: DC
  Administered 2017-12-26 – 2017-12-27 (×2): 81 mg via ORAL
  Filled 2017-12-26 (×2): qty 1

## 2017-12-26 MED ORDER — NITROGLYCERIN IN D5W 200-5 MCG/ML-% IV SOLN
0.0000 ug/min | INTRAVENOUS | Status: DC
  Administered 2017-12-26: 10 ug/min via INTRAVENOUS

## 2017-12-26 MED ORDER — SODIUM CHLORIDE 0.9 % IV BOLUS
250.0000 mL | Freq: Once | INTRAVENOUS | Status: AC
  Administered 2017-12-26: 250 mL via INTRAVENOUS

## 2017-12-26 MED ORDER — ALBUTEROL SULFATE (2.5 MG/3ML) 0.083% IN NEBU
2.5000 mg | INHALATION_SOLUTION | Freq: Once | RESPIRATORY_TRACT | Status: AC
  Administered 2017-12-26: 2.5 mg via RESPIRATORY_TRACT

## 2017-12-26 MED ORDER — NITROGLYCERIN IN D5W 200-5 MCG/ML-% IV SOLN
INTRAVENOUS | Status: AC
  Administered 2017-12-26: 10 ug/min via INTRAVENOUS
  Filled 2017-12-26: qty 250

## 2017-12-26 NOTE — Progress Notes (Signed)
Progress Note  Patient Name: [name] Date of Encounter: 12/26/2017  Primary Cardiologist: TT, follow up in [place] CABG on Wed. Pt states he had chest pain last night after eating salad and suspects it was related to GERD. He is now chest pain free.  Inpatient Medications    Scheduled Meds: . allopurinol  300 mg Oral q morning - 10a  . amLODipine  10 mg Oral q morning - 10a  . atorvastatin  40 mg Oral q1800  . metoprolol tartrate  12.5 mg Oral BID  . multivitamin with minerals  1 tablet Oral Daily  . sertraline  50 mg Oral Daily  . sodium chloride flush  3 mL Intravenous Q12H  . sodium chloride flush  3 mL Intravenous Q12H  . tamsulosin  0.4 mg Oral QPC supper  . vitamin B-12  500 mcg Oral Daily   Continuous Infusions: . sodium chloride    . sodium chloride    . heparin 1,500 Units/hr (12/25/17 2137)  . nitroGLYCERIN 10 mcg/min (12/26/17 0305)   PRN Meds: sodium chloride, sodium chloride, acetaminophen, nitroGLYCERIN, ondansetron (ZOFRAN) IV, oxyCODONE, sodium chloride flush, sodium chloride flush, zolpidem   Vital Signs    Vitals:   12/26/17 0220 12/26/17 0240 12/26/17 0541 12/26/17 0827  BP: 137/67 114/70 133/71 123/74  Pulse:   73 74  Resp:      Temp:   (!) 100.4 F (38 C)   TempSrc:   Oral   SpO2:   97%   Weight:   96.2 kg   Height:        Intake/Output Summary (Last 24 hours) at 12/26/2017 1103 Last data filed at 12/26/2017 0914 Gross per 24 hour  Intake 1132.25 ml  Output 1765 ml  Net -632.75 ml   Filed Weights   12/24/17 0454 12/25/17 0509 12/26/17 0541  Weight: 100.2 kg 96.7 kg 96.2 kg    Telemetry    Sinus  - Personally Reviewed  ECG    No new tracings - Personally Reviewed  Physical Exam   GEN: No acute distress.   Neck: No JVD Cardiac: RRR, no murmurs, rubs, or gallops.  Respiratory: Clear to auscultation bilaterally. GI: Soft, nontender, non-distended  MS: No edema; No deformity. Neuro:  Nonfocal    Psych: Normal affect   Labs    Chemistry Recent Labs  Lab 12/23/17 0445 12/24/17 0317 12/26/17 0323  NA 137 136 136  K 4.8 4.5 4.6  CL 105 106 105  CO2 19* 22 23  GLUCOSE 90 92 126*  BUN 24* 21 28*  CREATININE 1.71* 1.67* 1.77*  CALCIUM 8.5* 8.3* 8.4*  PROT 5.8*  --  6.4*  ALBUMIN 3.2*  --  3.3*  AST 16  --  18  ALT 14  --  18  ALKPHOS 48  --  54  BILITOT 1.1  --  1.0  GFRNONAA 35* 36* 34*  GFRAA 41* 42* 39*  ANIONGAP 13 8 8      Hematology Recent Labs  Lab 12/24/17 0317 12/25/17 0423 12/26/17 0323  WBC 8.8 7.8 12.6*  RBC 3.71* 3.61* 3.74*  HGB 11.3* 10.8* 11.3*  HCT 34.8* 33.7* 35.5*  MCV 93.8 93.4 94.9  MCH 30.5 29.9 30.2  MCHC 32.5 32.0 31.8  RDW 14.5 14.4 14.4  PLT 128* 127* 167    Cardiac EnzymesNo results for input(s): TROPONINI in the last 168 hours. No results for input(s): TROPIPOC in the last 168 hours.   BNPNo results for  input(s): BNP, PROBNP in the last 168 hours.   DDimer No results for input(s): DDIMER in the last 168 hours.   Radiology    No results found.  Cardiac Studies   Left heart cath 12/23/17:  Mid RCA-1 lesion is 80% stenosed.  Mid RCA-2 lesion is 70% stenosed.  Prox RCA lesion is 60% stenosed.  Prox LAD lesion is 90% stenosed.  Ost 1st Mrg to 1st Mrg lesion is 90% stenosed.  Mid Cx lesion is 100% stenosed.   1. Severe 3 vessel obstructive CAD with diffuse coronary calcification    - 90% proximal LAD at bifurcation of moderate sized diagonal branch    - 100% mid LCx occlusion. Left to left collaterals to a large OM branch    - diffuse proximal to mid RCA disease up to 80%.  Plan: will get an Echo to assess for valvular disease. Given complexity of disease I would recommend CVTS consult for potential CABG. Will resume IV heparin.    Echo 12/24/17: Study Conclusions - Left ventricle: The cavity size was normal. There was moderate   focal basal hypertrophy of the septum. Systolic function was   normal. The  estimated ejection fraction was in the range of 50%   to 55%. Wall motion was normal; there were no regional wall   motion abnormalities. Doppler parameters are consistent with   abnormal left ventricular relaxation (grade 1 diastolic   dysfunction). Doppler parameters are consistent with high   ventricular filling pressure. - Aortic valve: There was trivial regurgitation. - Ascending aorta: The ascending aorta was mildly dilated. - Mitral valve: There was mild regurgitation. - Atrial septum: There was increased thickness of the septum,   consistent with lipomatous hypertrophy.  Impressions: - Normal LV systolic function; mild diastolic dysfunction;   sclerotic aortic valve with trace AI; mildly dilated ascending   aorta; mild MR.  Patient Profile     84 y.o. male with a history of BPH, Anemia, HTN, HOH, OA, colon polyps.  He was admitted to [facility] with complaints of chest pain and shoulder pain and chest CT showed no evidence of PE but did show three-vessel coronary artery calcifications.  Nuclear stress test showed inferior lateral ischemia and he is transferred to [facility] for cath. Cath showed 3v disease and TCTS was consulted. Plan for CABG on Wed.   Assessment & Plan    1. Chest pain, heart cath with severe 3v disease: 80/70% mid RCA, prox 60% RCA, 90% pLAD, 90% ostial OM1 and occluded mLCx - plan for CABG on Wed  - pt is chest pain free - heparin and nitro drips running - continue norvasc and lopressor   2. HTN - pressures have been controlled  - continue meds as above   3. Acute on chronic kidney disease stage 3 - creatinine today 1.77 - baseline may be near 1.47-1.52 - patient making urine   4. HLD - 12/23/2017: Cholesterol 112; HDL 37; LDL Cholesterol 65; Triglycerides 52; VLDL 10 - continue statin    For questions or updates, please contact [place] Please consult [email or web address] for contact info under Cardiology/STEMI.        Signed, Tami Lin Milanie Rosenfield, PA  12/26/2017, 11:03 AM

## 2017-12-26 NOTE — Progress Notes (Signed)
Pt called out stating he awaken with sharp chest pain of 5 out of 10, gave nitro x 3, EKG x 2, VS check and temp check. Pt begin to dry heave multi times, breaking in to a bad sweat, shivering, pale looking and breathing with accessory muscles.  Put 2L of O2 on, check CBG 112 and notified rapid response and Cards fellow Akhter who ordered 250 bolus and Nitro drip at 41mcg.  Patient chest pain has decrease to 1 and he is resting, will continue to monitor.

## 2017-12-26 NOTE — Progress Notes (Signed)
ANTICOAGULATION CONSULT NOTE - Follow Up Consult  Pharmacy Consult for Heparin Indication: severe 3 vessel CAD, awating CABG  Allergies  Allergen Reactions  . Adhesive [Tape] Other (See Comments)    Redness   . Ativan [Lorazepam] Other (See Comments)    Hallucinations.  Has an adverse reaction to medication.  Disoriented.  "Mean"    Patient Measurements: Height: 5\' 10"  (177.8 cm) Weight: 212 lb 1.6 oz (96.2 kg) IBW/kg (Calculated) : 73 Heparin Dosing Weight: 96 kg  Vital Signs: Temp: 100.4 F (38 C) (08/12 0541) Temp Source: Oral (08/12 0541) BP: 123/74 (08/12 0827) Pulse Rate: 74 (08/12 0827)  Labs: Recent Labs    12/24/17 0317  12/24/17 1644 12/25/17 0423 12/26/17 0323  HGB 11.3*  --   --  10.8* 11.3*  HCT 34.8*  --   --  33.7* 35.5*  PLT 128*  --   --  127* 167  HEPARINUNFRC  --    < > 0.39 0.47 0.42  CREATININE 1.67*  --   --   --  1.77*   < > = values in this interval not displayed.    Estimated Creatinine Clearance: 36.2 mL/min (A) (by C-G formula based on SCr of 1.77 mg/dL (H)).   Assessment:  84 yo male s/p cath 8/9 which showed severe 3v CAD. Scheduled for CABG 8/14.   Heparin level remains therapeutic (0.42) on 1500 units/hr  Platelet count was low, but now up into normal range.  Goal of Therapy:  Heparin level 0.3-0.7 units/ml Monitor platelets by anticoagulation protocol: Yes   Plan:   Continue heparin drip at 1500 units/hr  Daily heparin level and CBC.  CABG scheduled for 12/28/17/  Claudie Fisherman Pager: [phone] or phone: [phone] 12/26/2017,9:54 AM

## 2017-12-26 NOTE — Significant Event (Signed)
Rapid Response Event Note  Overview:Called d/t chest pain, diaphoresis, dry heaving.  2 SL NTG given, EKG done, and pt placed on 2L [place] PTA RRT. Pt with 3VD and planned for CABG Wednesday. Time Called: 0235 Arrival Time: 0240    Initial Focused Assessment: Pt laying in bed, alert and oriented, diaphoretic, c/o 2/10 chest pain, no SOB.  Pt had episode of dry heaving PTA RRT. EKG- NSR.  HR-62, BP- 137/67, RR-22, SpO2-95 on 2L [place] (was 94% on RA). Lungs CTA. 3rd SL NTG given with pain decreasing to 1/10.  Interventions: 3rd SL NTG given.  FiO2 increased to 4L [place]. 250cc NS bolus started per MD order.  If pain still present after bolus, NTG gtt ordered to be started.  Plan of Care (if not transferred): Finish bolus.  Start NTG gtt if pain still present after bolus.  Continue to monitor pt.  Call RRT if further assistance needed. Event Summary: Name of Physician Notified: Dr Madelon Lips at 0245    at    Outcome: Stayed in room and stabalized     [place], Carren Rang

## 2017-12-27 LAB — BASIC METABOLIC PANEL
Anion gap: 9 (ref 5–15)
BUN: 30 mg/dL — ABNORMAL HIGH (ref 8–23)
CO2: 24 mmol/L (ref 22–32)
Calcium: 8.5 mg/dL — ABNORMAL LOW (ref 8.9–10.3)
Chloride: 105 mmol/L (ref 98–111)
Creatinine, Ser: 2.02 mg/dL — ABNORMAL HIGH (ref 0.61–1.24)
GFR calc Af Amer: 33 mL/min — ABNORMAL LOW (ref >60)
GFR calc non Af Amer: 29 mL/min — ABNORMAL LOW (ref >60)
Glucose, Bld: 113 mg/dL — ABNORMAL HIGH (ref 70–99)
Potassium: 4.2 mmol/L (ref 3.5–5.1)
Sodium: 138 mmol/L (ref 135–145)

## 2017-12-27 LAB — CBC
HCT: 31.6 % — ABNORMAL LOW (ref 39.0–52.0)
HEMOGLOBIN: 10.3 g/dL — AB (ref 13.0–17.0)
MCH: 30.7 pg (ref 26.0–34.0)
MCHC: 32.6 g/dL (ref 30.0–36.0)
MCV: 94.3 fL (ref 78.0–100.0)
Platelets: 153 10*3/uL (ref 150–400)
RBC: 3.35 MIL/uL — AB (ref 4.22–5.81)
RDW: 14.6 % (ref 11.5–15.5)
WBC: 10.8 10*3/uL — ABNORMAL HIGH (ref 4.0–10.5)

## 2017-12-27 LAB — IRON AND TIBC
IRON: 15 ug/dL — AB (ref 45–182)
SATURATION RATIOS: 5 % — AB (ref 17.9–39.5)
TIBC: 314 ug/dL (ref 250–450)
UIBC: 299 ug/dL

## 2017-12-27 LAB — BLOOD GAS, ARTERIAL
Acid-base deficit: 4 mmol/L — ABNORMAL HIGH (ref 0.0–2.0)
BICARBONATE: 19.7 mmol/L — AB (ref 20.0–28.0)
Drawn by: 51155
O2 CONTENT: 21 L/min
O2 Saturation: 91.4 %
PATIENT TEMPERATURE: 98.6
PH ART: 7.426 (ref 7.350–7.450)
pCO2 arterial: 30.5 mmHg — ABNORMAL LOW (ref 32.0–48.0)
pO2, Arterial: 63.3 mmHg — ABNORMAL LOW (ref 83.0–108.0)

## 2017-12-27 LAB — SURGICAL PCR SCREEN
MRSA, PCR: NEGATIVE
Staphylococcus aureus: NEGATIVE

## 2017-12-27 LAB — HEPARIN LEVEL (UNFRACTIONATED): HEPARIN UNFRACTIONATED: 0.34 [IU]/mL (ref 0.30–0.70)

## 2017-12-27 LAB — CREATININE, URINE, RANDOM: Creatinine, Urine: 172.27 mg/dL

## 2017-12-27 LAB — SODIUM, URINE, RANDOM: Sodium, Ur: 49 mmol/L

## 2017-12-27 MED ORDER — PLASMA-LYTE 148 IV SOLN
INTRAVENOUS | Status: AC
  Administered 2017-12-28: 500 mL
  Filled 2017-12-27: qty 2.5

## 2017-12-27 MED ORDER — SODIUM CHLORIDE 0.9 % IV SOLN
1.5000 g | INTRAVENOUS | Status: AC
  Administered 2017-12-28: 1.5 g via INTRAVENOUS
  Filled 2017-12-27: qty 1.5

## 2017-12-27 MED ORDER — NITROGLYCERIN IN D5W 200-5 MCG/ML-% IV SOLN
2.0000 ug/min | INTRAVENOUS | Status: DC
  Filled 2017-12-27: qty 250

## 2017-12-27 MED ORDER — DOPAMINE-DEXTROSE 3.2-5 MG/ML-% IV SOLN
0.0000 ug/kg/min | INTRAVENOUS | Status: DC
  Filled 2017-12-27: qty 250

## 2017-12-27 MED ORDER — SODIUM CHLORIDE 0.9 % IV SOLN
750.0000 mg | INTRAVENOUS | Status: DC
  Filled 2017-12-27: qty 750

## 2017-12-27 MED ORDER — CHLORHEXIDINE GLUCONATE 0.12 % MT SOLN
15.0000 mL | Freq: Once | OROMUCOSAL | Status: AC
  Administered 2017-12-28: 15 mL via OROMUCOSAL
  Filled 2017-12-27: qty 15

## 2017-12-27 MED ORDER — MILRINONE LACTATE IN DEXTROSE 20-5 MG/100ML-% IV SOLN
0.1250 ug/kg/min | INTRAVENOUS | Status: DC
  Filled 2017-12-27: qty 100

## 2017-12-27 MED ORDER — MAGNESIUM SULFATE 50 % IJ SOLN
40.0000 meq | INTRAMUSCULAR | Status: DC
  Filled 2017-12-27: qty 9.85

## 2017-12-27 MED ORDER — VANCOMYCIN HCL 10 G IV SOLR
1500.0000 mg | INTRAVENOUS | Status: AC
  Administered 2017-12-28: 1500 mg via INTRAVENOUS
  Filled 2017-12-27: qty 1500

## 2017-12-27 MED ORDER — AMIODARONE HCL IN DEXTROSE 360-4.14 MG/200ML-% IV SOLN
60.0000 mg/h | INTRAVENOUS | Status: AC
  Administered 2017-12-27: 60 mg/h via INTRAVENOUS

## 2017-12-27 MED ORDER — TRANEXAMIC ACID (OHS) BOLUS VIA INFUSION
15.0000 mg/kg | INTRAVENOUS | Status: AC
Start: 2017-12-28 — End: 2017-12-28
  Administered 2017-12-28: 1452 mg via INTRAVENOUS
  Filled 2017-12-27: qty 1452

## 2017-12-27 MED ORDER — PHENYLEPHRINE HCL 10 MG/ML IJ SOLN
30.0000 ug/min | INTRAMUSCULAR | Status: DC
  Filled 2017-12-27: qty 2

## 2017-12-27 MED ORDER — HEPARIN SODIUM (PORCINE) 1000 UNIT/ML IJ SOLN
INTRAMUSCULAR | Status: DC
  Filled 2017-12-27: qty 30

## 2017-12-27 MED ORDER — SODIUM CHLORIDE 0.9 % IV SOLN
INTRAVENOUS | Status: DC
  Administered 2017-12-27: 14:00:00 via INTRAVENOUS

## 2017-12-27 MED ORDER — TRANEXAMIC ACID 1000 MG/10ML IV SOLN
1.5000 mg/kg/h | INTRAVENOUS | Status: DC
  Filled 2017-12-27: qty 25

## 2017-12-27 MED ORDER — CHLORHEXIDINE GLUCONATE CLOTH 2 % EX PADS: 6.0000 | MEDICATED_PAD | Freq: Once | CUTANEOUS | Status: AC

## 2017-12-27 MED ORDER — EPINEPHRINE PF 1 MG/ML IJ SOLN
0.0000 ug/min | INTRAVENOUS | Status: DC
  Filled 2017-12-27: qty 4

## 2017-12-27 MED ORDER — TRANEXAMIC ACID (OHS) PUMP PRIME SOLUTION
2.0000 mg/kg | INTRAVENOUS | Status: DC
  Filled 2017-12-27: qty 1.94

## 2017-12-27 MED ORDER — AMIODARONE HCL IN DEXTROSE 360-4.14 MG/200ML-% IV SOLN
30.0000 mg/h | INTRAVENOUS | Status: DC
  Administered 2017-12-27: 30 mg/h via INTRAVENOUS
  Filled 2017-12-27 (×2): qty 200

## 2017-12-27 MED ORDER — POTASSIUM CHLORIDE 2 MEQ/ML IV SOLN
80.0000 meq | INTRAVENOUS | Status: DC
  Filled 2017-12-27: qty 40

## 2017-12-27 MED ORDER — BISACODYL 5 MG PO TBEC
5.0000 mg | DELAYED_RELEASE_TABLET | Freq: Once | ORAL | Status: AC
  Administered 2017-12-27: 5 mg via ORAL
  Filled 2017-12-27: qty 1

## 2017-12-27 MED ORDER — CHLORHEXIDINE GLUCONATE CLOTH 2 % EX PADS
6.0000 | MEDICATED_PAD | Freq: Once | CUTANEOUS | Status: AC
  Administered 2017-12-27: 6 via TOPICAL

## 2017-12-27 MED ORDER — SODIUM CHLORIDE 0.9 % IV SOLN
INTRAVENOUS | Status: AC
  Administered 2017-12-28: 1 [IU]/h via INTRAVENOUS
  Filled 2017-12-27: qty 1

## 2017-12-27 MED ORDER — DEXMEDETOMIDINE HCL IN NACL 400 MCG/100ML IV SOLN
0.1000 ug/kg/h | INTRAVENOUS | Status: AC
Start: 2017-12-28 — End: 2017-12-28
  Administered 2017-12-28: .2 ug/kg/h via INTRAVENOUS
  Filled 2017-12-27: qty 100

## 2017-12-27 NOTE — Progress Notes (Signed)
Progress Note  Patient Name: [name] Date of Encounter: 12/27/2017  Primary Cardiologist: TT, will follow up in [place]  Subjective   Pt denies further chest pain, nitro drip has been turned off.  Inpatient Medications    Scheduled Meds: . allopurinol  300 mg Oral q morning - 10a  . amLODipine  10 mg Oral q morning - 10a  . aspirin EC  81 mg Oral Daily  . atorvastatin  40 mg Oral q1800  . metoprolol tartrate  12.5 mg Oral BID  . multivitamin with minerals  1 tablet Oral Daily  . sertraline  50 mg Oral Daily  . sodium chloride flush  3 mL Intravenous Q12H  . sodium chloride flush  3 mL Intravenous Q12H  . tamsulosin  0.4 mg Oral QPC supper  . vitamin B-12  500 mcg Oral Daily   Continuous Infusions: . sodium chloride    . sodium chloride Stopped (12/26/17 1530)  . heparin 1,500 Units/hr (12/26/17 1606)   PRN Meds: sodium chloride, sodium chloride, acetaminophen, nitroGLYCERIN, ondansetron (ZOFRAN) IV, oxyCODONE, sodium chloride flush, sodium chloride flush, zolpidem   Vital Signs    Vitals:   12/26/17 0827 12/26/17 1445 12/26/17 2032 12/27/17 0550  BP: 123/74 120/71 (!) 153/89 106/63  Pulse: 74 71 77 74  Resp:  (!) 21 (!) 21 19  Temp:  99.1 F (37.3 C) 98.6 F (37 C) 99.5 F (37.5 C)  TempSrc:  Oral Oral Oral  SpO2:  98% 93% 91%  Weight:    96.8 kg  Height:        Intake/Output Summary (Last 24 hours) at 12/27/2017 0822 Last data filed at 12/27/2017 0615 Gross per 24 hour  Intake 1653.75 ml  Output 750 ml  Net 903.75 ml   Filed Weights   12/25/17 0509 12/26/17 0541 12/27/17 0550  Weight: 96.7 kg 96.2 kg 96.8 kg    Telemetry    sinus - Personally Reviewed  ECG    No new tracings - Personally Reviewed  Physical Exam   GEN: No acute distress.   Neck: No JVD Cardiac: RRR, no murmurs, rubs, or gallops.  Respiratory: Clear to auscultation bilaterally. GI: Soft, nontender, non-distended  MS: No edema; No deformity. Right radial C/D/I Neuro:   Nonfocal  Psych: Normal affect   Labs    Chemistry Recent Labs  Lab 12/23/17 0445 12/24/17 0317 12/26/17 0323 12/27/17 0232  NA 137 136 136 138  K 4.8 4.5 4.6 4.2  CL 105 106 105 105  CO2 19* 22 23 24   GLUCOSE 90 92 126* 113*  BUN 24* 21 28* 30*  CREATININE 1.71* 1.67* 1.77* 2.02*  CALCIUM 8.5* 8.3* 8.4* 8.5*  PROT 5.8*  --  6.4*  --   ALBUMIN 3.2*  --  3.3*  --   AST 16  --  18  --   ALT 14  --  18  --   ALKPHOS 48  --  54  --   BILITOT 1.1  --  1.0  --   GFRNONAA 35* 36* 34* 29*  GFRAA 41* 42* 39* 33*  ANIONGAP 13 8 8 9      Hematology Recent Labs  Lab 12/25/17 0423 12/26/17 0323 12/27/17 0232  WBC 7.8 12.6* 10.8*  RBC 3.61* 3.74* 3.35*  HGB 10.8* 11.3* 10.3*  HCT 33.7* 35.5* 31.6*  MCV 93.4 94.9 94.3  MCH 29.9 30.2 30.7  MCHC 32.0 31.8 32.6  RDW 14.4 14.4 14.6  PLT 127* 167 153  Cardiac EnzymesNo results for input(s): TROPONINI in the last 168 hours. No results for input(s): TROPIPOC in the last 168 hours.   BNPNo results for input(s): BNP, PROBNP in the last 168 hours.   DDimer No results for input(s): DDIMER in the last 168 hours.   Radiology    US Renal  Result Date: 12/26/2017 CLINICAL DATA:  Renal failure EXAM: RENAL / URINARY TRACT ULTRASOUND COMPLETE COMPARISON:  CT scan 12/21/2017 FINDINGS: Right Kidney: Length: 13.1 cm. No hydronephrosis. Adjacent large cysts noted towards the lower pole. Dominant cyst measures up to 7.8 cm. Imaging features similar to recent CT scan is stable to mildly progressed compared to CT scan of 04/11/2017. Left Kidney: Length: Cortical atrophy without hydronephrosis. Increased echogenicity. Moderately distended. Bladder: Appears normal for degree of bladder distention. IMPRESSION: No evidence for hydronephrosis. Large right renal cysts as noted at on previous studies. Left renal atrophy and increased echogenicity of the renal parenchyma. Electronically Signed   By: Misty Stanley M.D.   On: 12/26/2017 17:01    Cardiac  Studies   Left heart cath 12/23/17:  Mid RCA-1 lesion is 80% stenosed.  Mid RCA-2 lesion is 70% stenosed.  Prox RCA lesion is 60% stenosed.  Prox LAD lesion is 90% stenosed.  Ost 1st Mrg to 1st Mrg lesion is 90% stenosed.  Mid Cx lesion is 100% stenosed.  1. Severe 3 vessel obstructive CAD with diffuse coronary calcification - 90% proximal LAD at bifurcation of moderate sized diagonal branch - 100% mid LCx occlusion. Left to left collaterals to a large OM branch - diffuse proximal to mid RCA disease up to 80%.  Plan: will get an Echo to assess for valvular disease. Given complexity of disease I would recommend CVTS consult for potential CABG. Will resume IV heparin.    Echo 12/24/17: Study Conclusions - Left ventricle: The cavity size was normal. There was moderate focal basal hypertrophy of the septum. Systolic function was normal. The estimated ejection fraction was in the range of 50% to 55%. Wall motion was normal; there were no regional wall motion abnormalities. Doppler parameters are consistent with abnormal left ventricular relaxation (grade 1 diastolic dysfunction). Doppler parameters are consistent with high ventricular filling pressure. - Aortic valve: There was trivial regurgitation. - Ascending aorta: The ascending aorta was mildly dilated. - Mitral valve: There was mild regurgitation. - Atrial septum: There was increased thickness of the septum, consistent with lipomatous hypertrophy.  Impressions: - Normal LV systolic function; mild diastolic dysfunction; sclerotic aortic valve with trace AI; mildly dilated ascending aorta; mild MR.  Patient Profile     84 y.o. male with a history of BPH, Anemia, HTN, HOH, OA, colon polyps. He was admitted to [facility] with complaints of chest pain and shoulder pain and chest CT showed no evidence of PE but did show three-vessel coronary artery calcifications. Nuclear stress test  showed inferior lateral ischemia and he is transferred to [facility] for cath. Cath showed 3v disease and TCTS was consulted. Plan for CABG on Wed.  Assessment & Plan    1. Chest pain, heart cath with severe 3v disease: 80/70% mid RCA, prox 60% RCA, 90% pLAD, 90% ostial OM1 and occluded mLCx - plan for CABG tomorrow - pt denies chest pain - heparin drip running - nitro drip was D/C'ed yesterday - he has not required SL nitro   2. HTN - Pressures have been controlled - continue norvasc and lopressor   3. Acute on chronic kidney disease stage 3 -  serum creatinine today 2.02 (1.77) - baseline may be near 1.47-1.52 - renal function declining - no nephrotoxic medications on med list - renal ultrasound yesterday with known right renal cysts and left renal atrophy and increased echogenicity of renal parenchyma - will discuss with attending utility of nephrology consult prior to CABG scheduled tomorrow    4. HLD - 12/23/2017: Cholesterol 112; HDL 37; LDL Cholesterol 65; Triglycerides 52; VLDL 10 - continue statin   For questions or updates, please contact [place] Please consult [email or web address] for contact info under Cardiology/STEMI.      Signed, Tami Lin [place], PA  12/27/2017, 8:22 AM

## 2017-12-27 NOTE — Consult Note (Signed)
Reason for Consult:CKD, AKI Referring Physician: Dr. Florentina Jenny Soler is an 84 y.o. male.  HPI: 84 yr male with hx HTN >20 yr, hx DJD (2 TKR, 1 Hip)), hx back op , gout, UTIs on Trimethoprim, skin cancers. Now transferred from [facility] with CP and pos scan, now pos cath, for CABG.  Cr baseline 1.6-1.8, now risen to 2.02.   Had CT chest on 8/7, cath on 8/9.  Has bilat effusions on CXR.  Knows about cysts on kidneys but no knowledge of kidney dysfunction.  Was on ARB for HTN. Has prob with slow stream but no stones or hematuria. No NSAIDS.  No FH of renal dz or inherited eye, hearing, ms skel defects. He is HOH and uses HA. Constitutional: negative Eyes: glasses,. had catarracts done Ears, nose, mouth, throat, and face: negative Respiratory: negative Cardiovascular: as above Gastrointestinal: negative Genitourinary:as above Integument/breast: skin Ca Hematologic/lymphatic: known anemia Musculoskeletal:knees, hips, back Neurological: negative Behavioral/Psych: negative Endocrine: negative Allergic/Immunologic: Ativan, behavior rx   Past Medical History:  Diagnosis Date  . Anemia    takes Iron pill daily  . Arthritis   . Cancer ([place]) 02/14/13   skin cancer  . Cataract    right eye  and immature  . Chest pain with high risk for cardiac etiology, abnormal Myoview 12/22/2017  . Constipation   . Depression    no meds required  . Enlarged prostate    takes Flomax daily  . Gout    takes Allopurinol daily  . Hemorrhoids    occasionally  . History of blood transfusion    lap - colon blockage  . History of bronchitis    last time in Dec 2014  . History of colon polyps   . History of UTI    takes Trimethoprim daily  . Hypertension    takes Amlodipine and Avapro daily  . Impaired hearing    wears hearing aids  . Insomnia    takes Restoril nightly  . Joint pain   . Neuromuscular disorder ([place]) 02/14/13   damnage nerves in bil feet  . Neuropathy   . Peripheral neuropathy    . Renal calculus    large  . Shortness of breath    with exertion    Past Surgical History:  Procedure Laterality Date  . APPENDECTOMY  1964  . basal cell removed from back and face   2005  . basal cell removed from cheek and nose  2009  . basal cell removed from left ear  2014  . COLONOSCOPY  2010  . ESOPHAGOGASTRODUODENOSCOPY    . HEMORRHOID SURGERY  1970/2006  . INTRAMEDULLARY (IM) NAIL INTERTROCHANTERIC Right 02/15/2013   Procedure: INTRAMEDULLARY (IM) NAIL INTERTROCHANTRIC;  Surgeon: Rozanna Box, MD;  Location: [place];  Service: Orthopedics;  Laterality: Right;  . JOINT REPLACEMENT Left 09   knee  . laparoscopic surgery   2006   d/t blockage in intestine   . LEFT HEART CATH AND CORONARY ANGIOGRAPHY N/A 12/23/2017   Procedure: LEFT HEART CATH AND CORONARY ANGIOGRAPHY;  Surgeon: Martinique, Peter M, MD;  Location: [facility];  Service: Cardiovascular;  Laterality: N/A;  . LUMBAR LAMINECTOMY/DECOMPRESSION MICRODISCECTOMY Left 06/01/2013   Procedure: LEFT LUMBAR THREE-FOUR DECOMPRESSION LUMBAR LAMINECTOMY/MICRODISCECTOMY;POSSIBLE LUMBAR FOUR-FIVE;  Surgeon: Charlie Pitter, MD;  Location: [facility];  Service: Neurosurgery;  Laterality: Left;  left  . right hip surgery  2014  . TONSILLECTOMY  1970/2006  . TOTAL KNEE ARTHROPLASTY Right 09/03/2013   Procedure:  RIGHT TOTAL KNEE ARTHROPLASTY;  Surgeon: Vickey Huger, MD;  Location: [facility];  Service: Orthopedics;  Laterality: Right;    Family History  Problem Relation Age of Onset  . Emphysema Mother   . Heart attack Father 94  . Rheum arthritis Sister     Social History:  reports that he has never smoked. He has never used smokeless tobacco. He reports that he does not drink alcohol or use drugs.  Allergies:  Allergies  Allergen Reactions  . Ativan [Lorazepam] Other (See Comments)    Hallucinations.  Disoriented.  "Mean"  . Adhesive [Tape] Other (See Comments)    Redness     Medications:  I have reviewed the  patient's current medications. Prior to Admission:  Medications Prior to Admission  Medication Sig Dispense Refill Last Dose  . acetaminophen (TYLENOL) 500 MG tablet Take 1,000 mg by mouth every 6 (six) hours as needed for pain.   Unk at [facility]  . allopurinol (ZYLOPRIM) 300 MG tablet Take 300 mg by mouth every morning.   12/22/2017 at am  . amLODipine (NORVASC) 10 MG tablet Take 10 mg by mouth every morning.   12/21/2017 at am  . diphenhydramine-acetaminophen (TYLENOL PM) 25-500 MG TABS tablet Take 2 tablets by mouth at bedtime as needed (for sleep).   Unk at Unk  . irbesartan (AVAPRO) 150 MG tablet Take 150 mg by mouth daily.   12/21/2017 at am  . Multiple Vitamin (MULTIVITAMIN WITH MINERALS) TABS tablet Take 1 tablet by mouth daily.   12/21/2017 at Unknown time  . sertraline (ZOLOFT) 50 MG tablet Take 50 mg by mouth daily.   Past Month at Unknown time  . tamsulosin (FLOMAX) 0.4 MG CAPS capsule Take 0.4 mg by mouth daily after supper.    12/20/2017 at pm  . temazepam (RESTORIL) 15 MG capsule Take 15 mg by mouth at bedtime as needed for sleep.   Past Week at Unknown time  . vitamin B-12 (CYANOCOBALAMIN) 500 MCG tablet Take 500 mcg by mouth daily.   12/21/2017 at Unknown time  . vitamin C (ASCORBIC ACID) 500 MG tablet Take 500 mg by mouth daily.   Past Week at Unknown time    Results for orders placed or performed during the hospital encounter of 12/22/17 (from the past 48 hour(s))  Glucose, capillary     Status: Abnormal   Collection Time: 12/26/17  2:37 AM  Result Value Ref Range   Glucose-Capillary 116 (H) 70 - 99 mg/dL  Heparin level (unfractionated)     Status: None   Collection Time: 12/26/17  3:23 AM  Result Value Ref Range   Heparin Unfractionated 0.42 0.30 - 0.70 IU/mL    Comment: (NOTE) If heparin results are below expected values, and patient dosage has  been confirmed, suggest follow up testing of antithrombin III levels. Performed at [facility], [place] [address], [place],   [address]   CBC     Status: Abnormal   Collection Time: 12/26/17  3:23 AM  Result Value Ref Range   WBC 12.6 (H) 4.0 - 10.5 K/uL   RBC 3.74 (L) 4.22 - 5.81 MIL/uL   Hemoglobin 11.3 (L) 13.0 - 17.0 g/dL   HCT 35.5 (L) 39.0 - 52.0 %   MCV 94.9 78.0 - 100.0 fL   MCH 30.2 26.0 - 34.0 pg   MCHC 31.8 30.0 - 36.0 g/dL   RDW 14.4 11.5 - 15.5 %   Platelets 167 150 - 400 K/uL    Comment:  Performed at [facility], [place] [address] [place], [place] [address]  Comprehensive metabolic panel     Status: Abnormal   Collection Time: 12/26/17  3:23 AM  Result Value Ref Range   Sodium 136 135 - 145 mmol/L   Potassium 4.6 3.5 - 5.1 mmol/L   Chloride 105 98 - 111 mmol/L   CO2 23 22 - 32 mmol/L   Glucose, Bld 126 (H) 70 - 99 mg/dL   BUN 28 (H) 8 - 23 mg/dL   Creatinine, Ser 1.77 (H) 0.61 - 1.24 mg/dL   Calcium 8.4 (L) 8.9 - 10.3 mg/dL   Total Protein 6.4 (L) 6.5 - 8.1 g/dL   Albumin 3.3 (L) 3.5 - 5.0 g/dL   AST 18 15 - 41 U/L   ALT 18 0 - 44 U/L   Alkaline Phosphatase 54 38 - 126 U/L   Total Bilirubin 1.0 0.3 - 1.2 mg/dL   GFR calc non Af Amer 34 (L) >60 mL/min   GFR calc Af Amer 39 (L) >60 mL/min    Comment: (NOTE) The eGFR has been calculated using the CKD EPI equation. This calculation has not been validated in all clinical situations. eGFR's persistently <60 mL/min signify possible Chronic Kidney Disease.    Anion gap 8 5 - 15    Comment: Performed at [facility] [address], [place], [place] [address]  Heparin level (unfractionated)     Status: None   Collection Time: 12/27/17  2:32 AM  Result Value Ref Range   Heparin Unfractionated 0.34 0.30 - 0.70 IU/mL    Comment: (NOTE) If heparin results are below expected values, and patient dosage has  been confirmed, suggest follow up testing of antithrombin III levels. Performed at [facility], [place] [address] [place], [place] [address]   CBC     Status: Abnormal   Collection Time: 12/27/17  2:32 AM  Result Value  Ref Range   WBC 10.8 (H) 4.0 - 10.5 K/uL   RBC 3.35 (L) 4.22 - 5.81 MIL/uL   Hemoglobin 10.3 (L) 13.0 - 17.0 g/dL   HCT 31.6 (L) 39.0 - 52.0 %   MCV 94.3 78.0 - 100.0 fL   MCH 30.7 26.0 - 34.0 pg   MCHC 32.6 30.0 - 36.0 g/dL   RDW 14.6 11.5 - 15.5 %   Platelets 153 150 - 400 K/uL    Comment: Performed at [facility], [place] [address], [place], [place] [address]  Basic metabolic panel     Status: Abnormal   Collection Time: 12/27/17  2:32 AM  Result Value Ref Range   Sodium 138 135 - 145 mmol/L   Potassium 4.2 3.5 - 5.1 mmol/L   Chloride 105 98 - 111 mmol/L   CO2 24 22 - 32 mmol/L   Glucose, Bld 113 (H) 70 - 99 mg/dL   BUN 30 (H) 8 - 23 mg/dL   Creatinine, Ser 2.02 (H) 0.61 - 1.24 mg/dL   Calcium 8.5 (L) 8.9 - 10.3 mg/dL   GFR calc non Af Amer 29 (L) >60 mL/min   GFR calc Af Amer 33 (L) >60 mL/min    Comment: (NOTE) The eGFR has been calculated using the CKD EPI equation. This calculation has not been validated in all clinical situations. eGFR's persistently <60 mL/min signify possible Chronic Kidney Disease.    Anion gap 9 5 - 15    Comment: Performed at [facility] [place],  [place] [address]  Type and screen [place]     Status: None   Collection Time: 12/27/17  2:32 AM  Result Value Ref Range   ABO/RH(D) B NEG    Antibody Screen NEG    Sample Expiration      12/30/2017 Performed at [facility], [place] [address] [place], [place] [address]     US Renal  Result Date: 12/26/2017 CLINICAL DATA:  Renal failure EXAM: RENAL / URINARY TRACT ULTRASOUND COMPLETE COMPARISON:  CT scan 12/21/2017 FINDINGS: Right Kidney: Length: 13.1 cm. No hydronephrosis. Adjacent large cysts noted towards the lower pole. Dominant cyst measures up to 7.8 cm. Imaging features similar to recent CT scan is stable to mildly progressed compared to CT scan of 04/11/2017. Left Kidney: Length: Cortical atrophy without hydronephrosis. Increased  echogenicity. Moderately distended. Bladder: Appears normal for degree of bladder distention. IMPRESSION: No evidence for hydronephrosis. Large right renal cysts as noted at on previous studies. Left renal atrophy and increased echogenicity of the renal parenchyma. Electronically Signed   By: Misty Stanley M.D.   On: 12/26/2017 17:01    ROS Blood pressure 131/65, pulse 90, temperature 99.5 F (37.5 C), temperature source Oral, resp. rate 19, height _0  (1.778 m), weight 96.8 kg, SpO2 91 %. Physical Exam Physical Examination: General appearance - alert, well appearing, and in no distress, overweight and pale Mental status - alert, oriented to person, place, and time Eyes - pupils equal and reactive, extraocular eye movements intact, funduscopic exam normal, discs flat and sharp Mouth - mucous membranes moist, pharynx normal without lesions Neck - adenopathy noted PCL Lymphatics - posterior cervical nodes Chest - clear to auscultation, no wheezes, rales or rhonchi, symmetric air entry Heart - S1 and S2 normal, systolic murmur Gr 2/6 at 2nd left intercostal space Abdomen - obese, pos bs, soft Extremities - peripheral pulses normal, no pedal edema, no clubbing or cyanosis, no pedal edema noted, prom venous pattern Skin - chronic actinic changes, dry  Assessment/Plan: 1 CKD 3 with mild worsening, GFR at baseline about 40, now about 30. Most likely contrast . Vol ok, acid/base ok.  Should resolve spont.  Needs eval of his CKD and f/u. If stable ok for surgery. Small L kidney vasc vs infx does not need address.  Role of Trimethoprim ? But should not be delayed (inhibits Cr excretion).  2 CAD 3 Hypertension: not an issue 4. Anemia eval 5. Metabolic Bone Disease: eval 6 Hx UTIs 7 DJD P U/S, UA, urine chem, Fe, PTH, avoid nephrotoxins  Jeneen Rinks Dyanne Yorks 12/27/2017, 11:27 AM

## 2017-12-27 NOTE — Progress Notes (Signed)
Called by nursing for new onset atrial fibrillation. Rates in the 100s, patient asymptomatic. I started IV amiodarone without bolus. He is already on heparin drip.

## 2017-12-27 NOTE — Progress Notes (Signed)
CARDIAC REHAB PHASE I   Offered to walk with pt. Pt states he has been ambulating in hallway. Denies CP or SOB. Wife and son at bedside. Explained importance of ambulation after surgery. Wife asking about rehab after discharge. Briefly explained outpt cardiac rehab. No further questions or concern re sx. Will continue to follow throughout his stay.  [phone] Rufina Falco, RN BSN 12/27/2017 2:43 PM

## 2017-12-27 NOTE — Progress Notes (Signed)
[address]       [place] [id number]             [phone]                 4 Days Post-Op Procedure(s) (LRB): LEFT HEART CATH AND CORONARY ANGIOGRAPHY (N/A)  LOS: 5 days   Subjective: Patient feels well today without chest pain  Objective: Vital signs in last 24 hours: Patient Vitals for the past 24 hrs:  BP Temp Temp src Pulse Resp SpO2 Weight  12/27/17 1414 124/75 99.2 F (37.3 C) Oral 98 - 91 % -  12/27/17 0908 131/65 - - 90 - - -  12/27/17 0550 106/63 99.5 F (37.5 C) Oral 74 19 91 % 96.8 kg  12/26/17 2032 (!) 153/89 98.6 F (37 C) Oral 77 (!) 21 93 % -    Filed Weights   12/25/17 0509 12/26/17 0541 12/27/17 0550  Weight: 96.7 kg 96.2 kg 96.8 kg    Hemodynamic parameters for last 24 hours:    Intake/Output from previous day: 08/12 0701 - 08/13 0700 In: 1653.8 [P.O.:1200; I.V.:453.8] Out: 750 [Urine:750] Intake/Output this shift: Total I/O In: 480 [P.O.:480] Out: -   Scheduled Meds: . allopurinol  300 mg Oral q morning - 10a  . amLODipine  10 mg Oral q morning - 10a  . aspirin EC  81 mg Oral Daily  . atorvastatin  40 mg Oral q1800  . [START ON 12/28/2017] heparin-papaverine-plasmalyte irrigation   Irrigation To OR  . [START ON 12/28/2017] magnesium sulfate  40 mEq Other To OR  . metoprolol tartrate  12.5 mg Oral BID  . multivitamin with minerals  1 tablet Oral Daily  . [START ON 12/28/2017] potassium chloride  80 mEq Other To OR  . sertraline  50 mg Oral Daily  . sodium chloride flush  3 mL Intravenous Q12H  . sodium chloride flush  3 mL Intravenous Q12H  . tamsulosin  0.4 mg Oral QPC supper  . [START ON 12/28/2017] tranexamic acid  15 mg/kg Intravenous To OR  . [START ON 12/28/2017] tranexamic acid  2 mg/kg Intracatheter To OR  . vitamin B-12  500 mcg Oral Daily   Continuous Infusions: . sodium chloride    . sodium chloride Stopped (12/26/17 1530)  . sodium chloride 50 mL/hr at 12/27/17 1409  . [START ON 12/28/2017] cefUROXime  (ZINACEF)  IV    . [START ON 12/28/2017] cefUROXime (ZINACEF)  IV    . [START ON 12/28/2017] dexmedetomidine    . [START ON 12/28/2017] DOPamine    . [START ON 12/28/2017] epinephrine    . [START ON 12/28/2017] heparin 30,000 units/NS 1000 mL solution for CELLSAVER    . heparin 1,500 Units/hr (12/27/17 0907)  . [START ON 12/28/2017] insulin (NOVOLIN-R) infusion    . [START ON 12/28/2017] milrinone    . [START ON 12/28/2017] nitroGLYCERIN    . [START ON 12/28/2017] phenylephrine 20mg /243mL NS (0.08mg /ml) infusion    . [START ON 12/28/2017] tranexamic acid (CYKLOKAPRON) infusion ([facility])    . [START ON 12/28/2017] vancomycin     PRN Meds:.sodium chloride, sodium chloride, acetaminophen, nitroGLYCERIN, ondansetron (ZOFRAN) IV, oxyCODONE, sodium chloride flush, sodium chloride flush, zolpidem  General appearance: alert, cooperative and no distress Neurologic: intact Heart: regular rate and rhythm, S1, S2 normal, no murmur, click, rub or gallop Lungs: clear to auscultation bilaterally Abdomen: soft, non-tender; bowel sounds normal; no masses,  no organomegaly Extremities: extremities normal, atraumatic, no cyanosis or  edema  Lab Results: CBC: Recent Labs    12/26/17 0323 12/27/17 0232  WBC 12.6* 10.8*  HGB 11.3* 10.3*  HCT 35.5* 31.6*  PLT 167 153   BMET:  Recent Labs    12/26/17 0323 12/27/17 0232  NA 136 138  K 4.6 4.2  CL 105 105  CO2 23 24  GLUCOSE 126* 113*  BUN 28* 30*  CREATININE 1.77* 2.02*  CALCIUM 8.4* 8.5*    PT/INR: No results for input(s): LABPROT, INR in the last 72 hours.   Radiology US Renal  Result Date: 12/26/2017 CLINICAL DATA:  Renal failure EXAM: RENAL / URINARY TRACT ULTRASOUND COMPLETE COMPARISON:  CT scan 12/21/2017 FINDINGS: Right Kidney: Length: 13.1 cm. No hydronephrosis. Adjacent large cysts noted towards the lower pole. Dominant cyst measures up to 7.8 cm. Imaging features similar to recent CT scan is stable to mildly progressed compared to CT scan of  04/11/2017. Left Kidney: Length: Cortical atrophy without hydronephrosis. Increased echogenicity. Moderately distended. Bladder: Appears normal for degree of bladder distention. IMPRESSION: No evidence for hydronephrosis. Large right renal cysts as noted at on previous studies. Left renal atrophy and increased echogenicity of the renal parenchyma. Electronically Signed   By: Misty Stanley M.D.   On: 12/26/2017 17:01     Assessment/Plan: S/P Procedure(s) (LRB): LEFT HEART CATH AND CORONARY ANGIOGRAPHY (N/A) Plan CABG tomorrow , discussed plan procedure with the patient his wife and son.  He is aware of increased risks of procedure secondary to his age and underlying chronic renal disease.  He has no obstruction of his urinary tract on ultrasound.   The goals risks and alternatives of the planned surgical procedure coronary artery bypass graft have been discussed with the patient in detail. The risks of the procedure including death, infection, stroke, myocardial infarction, bleeding, blood transfusion have all been discussed specifically.  I have quoted [name] a 8 % of perioperative mortality and a complication rate as high as 50%. The patient's questions have been answered.Kayzen Kendzierski is willing  to proceed with the planned procedure.  Grace Isaac MD 12/27/2017 5:29 PM     Patient ID: [name], male   DOB: 13-Dec-1931, 84 y.o.   MRN: [id number]

## 2017-12-27 NOTE — Progress Notes (Signed)
ANTICOAGULATION CONSULT NOTE - Follow Up Consult  Pharmacy Consult for Heparin Indication: severe 3 vessel CAD, awating CABG  Allergies  Allergen Reactions  . Adhesive [Tape] Other (See Comments)    Redness   . Ativan [Lorazepam] Other (See Comments)    Hallucinations.  Has an adverse reaction to medication.  Disoriented.  "Mean"    Patient Measurements: Height: 5\' 10"  (177.8 cm) Weight: 213 lb 8 oz (96.8 kg) IBW/kg (Calculated) : 73 Heparin Dosing Weight: 96 kg  Vital Signs: Temp: 99.5 F (37.5 C) (08/13 0550) Temp Source: Oral (08/13 0550) BP: 131/65 (08/13 0908) Pulse Rate: 90 (08/13 0908)  Labs: Recent Labs    12/25/17 0423 12/26/17 0323 12/27/17 0232  HGB 10.8* 11.3* 10.3*  HCT 33.7* 35.5* 31.6*  PLT 127* 167 153  HEPARINUNFRC 0.47 0.42 0.34  CREATININE  --  1.77* 2.02*    Estimated Creatinine Clearance: 31.8 mL/min (A) (by C-G formula based on SCr of 2.02 mg/dL (H)).   Assessment:  84 yo male s/p cath 8/9 which showed severe 3v CAD. Scheduled for CABG 8/14.   Heparin level remains therapeutic (0.34) on 1500 units/hr  Platelet count was low, but now low normal.  No bleeding reported.  Creatinine has trended up.   Goal of Therapy:  Heparin level 0.3-0.7 units/ml Monitor platelets by anticoagulation protocol: Yes   Plan:   Continue heparin drip at 1500 units/hr  Daily heparin level and CBC.  CABG scheduled for 12/28/17.  Arty Baumgartner, [place] Pager: [phone] or phone: [phone] 12/27/2017,9:54 AM

## 2017-12-27 NOTE — Anesthesia Preprocedure Evaluation (Addendum)
Anesthesia Evaluation  Patient identified by MRN, date of birth, ID band Patient awake    Reviewed: Allergy & Precautions, NPO status , Patient's Chart, lab work & pertinent test results  Airway Mallampati: II  TM Distance: >3 FB Neck ROM: Full    Dental  (+) Dental Advisory Given   Pulmonary shortness of breath,    breath sounds clear to auscultation       Cardiovascular hypertension, Pt. on medications + CAD   Rhythm:Regular Rate:Normal  Impressions: - Normal LV systolic function; mild diastolic dysfunction;   sclerotic aortic valve with trace AI; mildly dilated ascending aorta; mild MR.   Neuro/Psych negative neurological ROS     GI/Hepatic negative GI ROS, Neg liver ROS,   Endo/Other  negative endocrine ROS  Renal/GU CRF and ARFRenal disease     Musculoskeletal  (+) Arthritis ,   Abdominal   Peds  Hematology  (+) anemia ,   Anesthesia Other Findings   Reproductive/Obstetrics                            Lab Results  Component Value Date   WBC 11.6 (H) 12/28/2017   HGB 10.0 (L) 12/28/2017   HCT 30.9 (L) 12/28/2017   MCV 93.6 12/28/2017   PLT 150 12/28/2017   Lab Results  Component Value Date   CREATININE 2.06 (H) 12/28/2017   BUN 32 (H) 12/28/2017   NA 134 (L) 12/28/2017   K 4.8 12/28/2017   CL 104 12/28/2017   CO2 21 (L) 12/28/2017   Lab Results  Component Value Date   INR 1.17 12/23/2017   INR 1.09 08/24/2013   INR 1.0 03/27/2008    Anesthesia Physical Anesthesia Plan  ASA: IV  Anesthesia Plan: General   Post-op Pain Management:    Induction: Intravenous  PONV Risk Score and Plan: 2 and Ondansetron, Dexamethasone and Treatment may vary due to age or medical condition  Airway Management Planned: Oral ETT  Additional Equipment: Arterial line, CVP, PA Cath, TEE and Ultrasound Guidance Line Placement  Intra-op Plan:   Post-operative Plan: Post-operative  intubation/ventilation  Informed Consent: I have reviewed the patients History and Physical, chart, labs and discussed the procedure including the risks, benefits and alternatives for the proposed anesthesia with the patient or authorized representative who has indicated his/her understanding and acceptance.   Dental advisory given  Plan Discussed with: CRNA  Anesthesia Plan Comments:        Anesthesia Quick Evaluation

## 2017-12-28 ENCOUNTER — Inpatient Hospital Stay (HOSPITAL_COMMUNITY): Payer: Medicare Other

## 2017-12-28 ENCOUNTER — Inpatient Hospital Stay (HOSPITAL_COMMUNITY): Payer: Medicare Other | Admitting: Anesthesiology

## 2017-12-28 ENCOUNTER — Inpatient Hospital Stay (HOSPITAL_COMMUNITY)
Admission: AD | Disposition: A | Discharge status: Home Health | Payer: Self-pay | Source: Other Acute Inpatient Hospital | Attending: Cardiothoracic Surgery

## 2017-12-28 DIAGNOSIS — I2510 Atherosclerotic heart disease of native coronary artery without angina pectoris: Secondary | ICD-10-CM | POA: Diagnosis present

## 2017-12-28 DIAGNOSIS — Z951 Presence of aortocoronary bypass graft: Secondary | ICD-10-CM

## 2017-12-28 HISTORY — DX: Atherosclerotic heart disease of native coronary artery without angina pectoris: I25.10

## 2017-12-28 HISTORY — PX: TEE WITHOUT CARDIOVERSION: SHX5443

## 2017-12-28 HISTORY — PX: CORONARY ARTERY BYPASS GRAFT: SHX141

## 2017-12-28 HISTORY — DX: Presence of aortocoronary bypass graft: Z95.1

## 2017-12-28 LAB — POCT I-STAT 3, ART BLOOD GAS (G3+)
ACID-BASE DEFICIT: 7 mmol/L — AB (ref 0.0–2.0)
ACID-BASE DEFICIT: 6 mmol/L — AB (ref 0.0–2.0)
ACID-BASE DEFICIT: 7 mmol/L — AB (ref 0.0–2.0)
Acid-base deficit: 5 mmol/L — ABNORMAL HIGH (ref 0.0–2.0)
Acid-base deficit: 10 mmol/L — ABNORMAL HIGH (ref 0.0–2.0)
Acid-base deficit: 7 mmol/L — ABNORMAL HIGH (ref 0.0–2.0)
Acid-base deficit: 4 mmol/L — ABNORMAL HIGH (ref 0.0–2.0)
BICARBONATE: 15.9 mmol/L — AB (ref 20.0–28.0)
BICARBONATE: 18.8 mmol/L — AB (ref 20.0–28.0)
BICARBONATE: 20.8 mmol/L (ref 20.0–28.0)
Bicarbonate: 20.5 mmol/L (ref 20.0–28.0)
Bicarbonate: 19 mmol/L — ABNORMAL LOW (ref 20.0–28.0)
Bicarbonate: 19.7 mmol/L — ABNORMAL LOW (ref 20.0–28.0)
Bicarbonate: 18 mmol/L — ABNORMAL LOW (ref 20.0–28.0)
O2 SAT: 100 %
O2 SAT: 87 %
O2 SAT: 97 %
O2 SAT: 95 %
O2 Saturation: 100 %
O2 Saturation: 99 %
O2 Saturation: 94 %
PCO2 ART: 38.3 mmHg (ref 32.0–48.0)
PCO2 ART: 33 mmHg (ref 32.0–48.0)
PCO2 ART: 35.9 mmHg (ref 32.0–48.0)
PH ART: 7.338 — AB (ref 7.350–7.450)
PH ART: 7.291 — AB (ref 7.350–7.450)
PH ART: 7.366 (ref 7.350–7.450)
PH ART: 7.328 — AB (ref 7.350–7.450)
PH ART: 7.336 — AB (ref 7.350–7.450)
PO2 ART: 410 mmHg — AB (ref 83.0–108.0)
PO2 ART: 91 mmHg (ref 83.0–108.0)
PO2 ART: 80 mmHg — AB (ref 83.0–108.0)
Patient temperature: 36
Patient temperature: 36.6
TCO2: 22 mmol/L (ref 22–32)
TCO2: 17 mmol/L — ABNORMAL LOW (ref 22–32)
TCO2: 20 mmol/L — AB (ref 22–32)
TCO2: 20 mmol/L — ABNORMAL LOW (ref 22–32)
TCO2: 22 mmol/L (ref 22–32)
TCO2: 21 mmol/L — ABNORMAL LOW (ref 22–32)
TCO2: 19 mmol/L — ABNORMAL LOW (ref 22–32)
pCO2 arterial: 37.6 mmHg (ref 32.0–48.0)
pCO2 arterial: 38.2 mmHg (ref 32.0–48.0)
pCO2 arterial: 37.3 mmHg (ref 32.0–48.0)
pCO2 arterial: 33.6 mmHg (ref 32.0–48.0)
pH, Arterial: 7.307 — ABNORMAL LOW (ref 7.350–7.450)
pH, Arterial: 7.299 — ABNORMAL LOW (ref 7.350–7.450)
pO2, Arterial: 131 mmHg — ABNORMAL HIGH (ref 83.0–108.0)
pO2, Arterial: 187 mmHg — ABNORMAL HIGH (ref 83.0–108.0)
pO2, Arterial: 56 mmHg — ABNORMAL LOW (ref 83.0–108.0)
pO2, Arterial: 74 mmHg — ABNORMAL LOW (ref 83.0–108.0)

## 2017-12-28 LAB — RENAL FUNCTION PANEL
ALBUMIN: 2.9 g/dL — AB (ref 3.5–5.0)
ANION GAP: 9 (ref 5–15)
BUN: 32 mg/dL — ABNORMAL HIGH (ref 8–23)
CALCIUM: 8.5 mg/dL — AB (ref 8.9–10.3)
CO2: 21 mmol/L — ABNORMAL LOW (ref 22–32)
Chloride: 104 mmol/L (ref 98–111)
Creatinine, Ser: 2.06 mg/dL — ABNORMAL HIGH (ref 0.61–1.24)
GFR, EST AFRICAN AMERICAN: 32 mL/min — AB (ref >60)
GFR, EST NON AFRICAN AMERICAN: 28 mL/min — AB (ref >60)
Glucose, Bld: 126 mg/dL — ABNORMAL HIGH (ref 70–99)
PHOSPHORUS: 4.3 mg/dL (ref 2.5–4.6)
POTASSIUM: 4.8 mmol/L (ref 3.5–5.1)
SODIUM: 134 mmol/L — AB (ref 135–145)

## 2017-12-28 LAB — POCT I-STAT, CHEM 8
BUN: 29 mg/dL — ABNORMAL HIGH (ref 8–23)
BUN: 23 mg/dL (ref 8–23)
BUN: 28 mg/dL — ABNORMAL HIGH (ref 8–23)
BUN: 29 mg/dL — ABNORMAL HIGH (ref 8–23)
BUN: 30 mg/dL — AB (ref 8–23)
BUN: 31 mg/dL — ABNORMAL HIGH (ref 8–23)
BUN: 25 mg/dL — ABNORMAL HIGH (ref 8–23)
BUN: 30 mg/dL — AB (ref 8–23)
BUN: 28 mg/dL — ABNORMAL HIGH (ref 8–23)
CALCIUM ION: 0.99 mmol/L — AB (ref 1.15–1.40)
CALCIUM ION: 1.07 mmol/L — AB (ref 1.15–1.40)
CALCIUM ION: 1.18 mmol/L (ref 1.15–1.40)
CHLORIDE: 104 mmol/L (ref 98–111)
CHLORIDE: 103 mmol/L (ref 98–111)
CHLORIDE: 105 mmol/L (ref 98–111)
CHLORIDE: 110 mmol/L (ref 98–111)
CREATININE: 1.9 mg/dL — AB (ref 0.61–1.24)
CREATININE: 1.9 mg/dL — AB (ref 0.61–1.24)
CREATININE: 1.9 mg/dL — AB (ref 0.61–1.24)
Calcium, Ion: 1.18 mmol/L (ref 1.15–1.40)
Calcium, Ion: 1.1 mmol/L — ABNORMAL LOW (ref 1.15–1.40)
Calcium, Ion: 1.11 mmol/L — ABNORMAL LOW (ref 1.15–1.40)
Calcium, Ion: 1.07 mmol/L — ABNORMAL LOW (ref 1.15–1.40)
Calcium, Ion: 0.91 mmol/L — ABNORMAL LOW (ref 1.15–1.40)
Calcium, Ion: 1.09 mmol/L — ABNORMAL LOW (ref 1.15–1.40)
Chloride: 105 mmol/L (ref 98–111)
Chloride: 112 mmol/L — ABNORMAL HIGH (ref 98–111)
Chloride: 103 mmol/L (ref 98–111)
Chloride: 104 mmol/L (ref 98–111)
Chloride: 106 mmol/L (ref 98–111)
Creatinine, Ser: 1.4 mg/dL — ABNORMAL HIGH (ref 0.61–1.24)
Creatinine, Ser: 1.7 mg/dL — ABNORMAL HIGH (ref 0.61–1.24)
Creatinine, Ser: 1.8 mg/dL — ABNORMAL HIGH (ref 0.61–1.24)
Creatinine, Ser: 1.9 mg/dL — ABNORMAL HIGH (ref 0.61–1.24)
Creatinine, Ser: 1.4 mg/dL — ABNORMAL HIGH (ref 0.61–1.24)
Creatinine, Ser: 1.8 mg/dL — ABNORMAL HIGH (ref 0.61–1.24)
GLUCOSE: 110 mg/dL — AB (ref 70–99)
GLUCOSE: 101 mg/dL — AB (ref 70–99)
GLUCOSE: 187 mg/dL — AB (ref 70–99)
GLUCOSE: 172 mg/dL — AB (ref 70–99)
Glucose, Bld: 118 mg/dL — ABNORMAL HIGH (ref 70–99)
Glucose, Bld: 175 mg/dL — ABNORMAL HIGH (ref 70–99)
Glucose, Bld: 189 mg/dL — ABNORMAL HIGH (ref 70–99)
Glucose, Bld: 189 mg/dL — ABNORMAL HIGH (ref 70–99)
Glucose, Bld: 153 mg/dL — ABNORMAL HIGH (ref 70–99)
HCT: 28 % — ABNORMAL LOW (ref 39.0–52.0)
HCT: 26 % — ABNORMAL LOW (ref 39.0–52.0)
HCT: 21 % — ABNORMAL LOW (ref 39.0–52.0)
HCT: 29 % — ABNORMAL LOW (ref 39.0–52.0)
HCT: 25 % — ABNORMAL LOW (ref 39.0–52.0)
HEMATOCRIT: 23 % — AB (ref 39.0–52.0)
HEMATOCRIT: 26 % — AB (ref 39.0–52.0)
HEMATOCRIT: 26 % — AB (ref 39.0–52.0)
HEMATOCRIT: 25 % — AB (ref 39.0–52.0)
HEMOGLOBIN: 8.8 g/dL — AB (ref 13.0–17.0)
HEMOGLOBIN: 7.8 g/dL — AB (ref 13.0–17.0)
Hemoglobin: 9.5 g/dL — ABNORMAL LOW (ref 13.0–17.0)
Hemoglobin: 8.8 g/dL — ABNORMAL LOW (ref 13.0–17.0)
Hemoglobin: 8.8 g/dL — ABNORMAL LOW (ref 13.0–17.0)
Hemoglobin: 8.5 g/dL — ABNORMAL LOW (ref 13.0–17.0)
Hemoglobin: 7.1 g/dL — ABNORMAL LOW (ref 13.0–17.0)
Hemoglobin: 9.9 g/dL — ABNORMAL LOW (ref 13.0–17.0)
Hemoglobin: 8.5 g/dL — ABNORMAL LOW (ref 13.0–17.0)
POTASSIUM: 4.6 mmol/L (ref 3.5–5.1)
POTASSIUM: 4.5 mmol/L (ref 3.5–5.1)
POTASSIUM: 4.6 mmol/L (ref 3.5–5.1)
POTASSIUM: 5 mmol/L (ref 3.5–5.1)
POTASSIUM: 3.5 mmol/L (ref 3.5–5.1)
POTASSIUM: 4.4 mmol/L (ref 3.5–5.1)
Potassium: 3.3 mmol/L — ABNORMAL LOW (ref 3.5–5.1)
Potassium: 4.1 mmol/L (ref 3.5–5.1)
Potassium: 4.5 mmol/L (ref 3.5–5.1)
SODIUM: 141 mmol/L (ref 135–145)
SODIUM: 136 mmol/L (ref 135–145)
SODIUM: 135 mmol/L (ref 135–145)
SODIUM: 135 mmol/L (ref 135–145)
SODIUM: 142 mmol/L (ref 135–145)
SODIUM: 137 mmol/L (ref 135–145)
Sodium: 134 mmol/L — ABNORMAL LOW (ref 135–145)
Sodium: 135 mmol/L (ref 135–145)
Sodium: 137 mmol/L (ref 135–145)
TCO2: 21 mmol/L — AB (ref 22–32)
TCO2: 16 mmol/L — AB (ref 22–32)
TCO2: 23 mmol/L (ref 22–32)
TCO2: 23 mmol/L (ref 22–32)
TCO2: 24 mmol/L (ref 22–32)
TCO2: 24 mmol/L (ref 22–32)
TCO2: 18 mmol/L — ABNORMAL LOW (ref 22–32)
TCO2: 20 mmol/L — ABNORMAL LOW (ref 22–32)
TCO2: 20 mmol/L — AB (ref 22–32)

## 2017-12-28 LAB — CBC
HCT: 31.4 % — ABNORMAL LOW (ref 39.0–52.0)
HCT: 24.7 % — ABNORMAL LOW (ref 39.0–52.0)
HEMATOCRIT: 30.9 % — AB (ref 39.0–52.0)
HEMOGLOBIN: 10 g/dL — AB (ref 13.0–17.0)
Hemoglobin: 10.2 g/dL — ABNORMAL LOW (ref 13.0–17.0)
Hemoglobin: 8 g/dL — ABNORMAL LOW (ref 13.0–17.0)
MCH: 30.3 pg (ref 26.0–34.0)
MCH: 30.6 pg (ref 26.0–34.0)
MCH: 30.8 pg (ref 26.0–34.0)
MCHC: 32.4 g/dL (ref 30.0–36.0)
MCHC: 32.5 g/dL (ref 30.0–36.0)
MCHC: 32.4 g/dL (ref 30.0–36.0)
MCV: 93.6 fL (ref 78.0–100.0)
MCV: 94.3 fL (ref 78.0–100.0)
MCV: 95 fL (ref 78.0–100.0)
PLATELETS: 118 10*3/uL — AB (ref 150–400)
Platelets: 150 10*3/uL (ref 150–400)
Platelets: 122 10*3/uL — ABNORMAL LOW (ref 150–400)
RBC: 3.3 MIL/uL — AB (ref 4.22–5.81)
RBC: 3.33 MIL/uL — AB (ref 4.22–5.81)
RBC: 2.6 MIL/uL — ABNORMAL LOW (ref 4.22–5.81)
RDW: 14.4 % (ref 11.5–15.5)
RDW: 14.4 % (ref 11.5–15.5)
RDW: 14.6 % (ref 11.5–15.5)
WBC: 11.6 10*3/uL — AB (ref 4.0–10.5)
WBC: 15.2 10*3/uL — ABNORMAL HIGH (ref 4.0–10.5)
WBC: 15 10*3/uL — ABNORMAL HIGH (ref 4.0–10.5)

## 2017-12-28 LAB — HEMOGLOBIN AND HEMATOCRIT, BLOOD
HCT: 25.8 % — ABNORMAL LOW (ref 39.0–52.0)
Hemoglobin: 8.3 g/dL — ABNORMAL LOW (ref 13.0–17.0)

## 2017-12-28 LAB — GLUCOSE, CAPILLARY
GLUCOSE-CAPILLARY: 155 mg/dL — AB (ref 70–99)
Glucose-Capillary: 132 mg/dL — ABNORMAL HIGH (ref 70–99)
Glucose-Capillary: 117 mg/dL — ABNORMAL HIGH (ref 70–99)
Glucose-Capillary: 115 mg/dL — ABNORMAL HIGH (ref 70–99)
Glucose-Capillary: 166 mg/dL — ABNORMAL HIGH (ref 70–99)

## 2017-12-28 LAB — POCT I-STAT 4, (NA,K, GLUC, HGB,HCT)
Glucose, Bld: 166 mg/dL — ABNORMAL HIGH (ref 70–99)
HEMATOCRIT: 28 % — AB (ref 39.0–52.0)
HEMOGLOBIN: 9.5 g/dL — AB (ref 13.0–17.0)
POTASSIUM: 4.2 mmol/L (ref 3.5–5.1)
SODIUM: 138 mmol/L (ref 135–145)

## 2017-12-28 LAB — MAGNESIUM: Magnesium: 2.5 mg/dL — ABNORMAL HIGH (ref 1.7–2.4)

## 2017-12-28 LAB — HEPARIN LEVEL (UNFRACTIONATED): HEPARIN UNFRACTIONATED: 0.15 [IU]/mL — AB (ref 0.30–0.70)

## 2017-12-28 LAB — C3 COMPLEMENT: C3 Complement: 138 mg/dL (ref 82–167)

## 2017-12-28 LAB — CREATININE, SERUM
Creatinine, Ser: 1.85 mg/dL — ABNORMAL HIGH (ref 0.61–1.24)
GFR calc Af Amer: 37 mL/min — ABNORMAL LOW (ref >60)
GFR calc non Af Amer: 32 mL/min — ABNORMAL LOW (ref >60)

## 2017-12-28 LAB — APTT
APTT: 73 s — AB (ref 24–36)
aPTT: 42 seconds — ABNORMAL HIGH (ref 24–36)

## 2017-12-28 LAB — PLATELET COUNT: Platelets: 144 10*3/uL — ABNORMAL LOW (ref 150–400)

## 2017-12-28 LAB — C4 COMPLEMENT: COMPLEMENT C4, BODY FLUID: 27 mg/dL (ref 14–44)

## 2017-12-28 LAB — PROTIME-INR
INR: 1.56
Prothrombin Time: 18.5 seconds — ABNORMAL HIGH (ref 11.4–15.2)

## 2017-12-28 LAB — PARATHYROID HORMONE, INTACT (NO CA): PTH: 37 pg/mL (ref 15–65)

## 2017-12-28 LAB — PREPARE RBC (CROSSMATCH)

## 2017-12-28 SURGERY — CORONARY ARTERY BYPASS GRAFTING (CABG)
Anesthesia: General | Site: Chest

## 2017-12-28 MED ORDER — ACETAMINOPHEN 160 MG/5ML PO SOLN: 650.0000 mg | Freq: Once | ORAL | Status: AC

## 2017-12-28 MED ORDER — LIDOCAINE HCL (CARDIAC) PF 100 MG/5ML IV SOSY
PREFILLED_SYRINGE | INTRAVENOUS | Status: DC | PRN
  Administered 2017-12-28: 50 mg via INTRATRACHEAL

## 2017-12-28 MED ORDER — FENTANYL CITRATE (PF) 250 MCG/5ML IJ SOLN
INTRAMUSCULAR | Status: DC | PRN
  Administered 2017-12-28: 450 ug via INTRAVENOUS
  Administered 2017-12-28: 50 ug via INTRAVENOUS
  Administered 2017-12-28 (×2): 250 ug via INTRAVENOUS

## 2017-12-28 MED ORDER — AMIODARONE HCL IN DEXTROSE 360-4.14 MG/200ML-% IV SOLN
30.0000 mg/h | INTRAVENOUS | Status: DC
  Administered 2017-12-28 – 2017-12-30 (×4): 30 mg/h via INTRAVENOUS
  Filled 2017-12-28 (×3): qty 200

## 2017-12-28 MED ORDER — ASPIRIN EC 325 MG PO TBEC
325.0000 mg | DELAYED_RELEASE_TABLET | Freq: Every day | ORAL | Status: DC
Start: 2017-12-29 — End: 2018-01-03
  Administered 2017-12-29 – 2018-01-03 (×6): 325 mg via ORAL
  Filled 2017-12-28 (×6): qty 1

## 2017-12-28 MED ORDER — SODIUM CHLORIDE 0.9 % IV SOLN
1.5000 g | Freq: Two times a day (BID) | INTRAVENOUS | Status: AC
  Administered 2017-12-28 – 2017-12-30 (×4): 1.5 g via INTRAVENOUS
  Filled 2017-12-28 (×4): qty 1.5

## 2017-12-28 MED ORDER — MORPHINE SULFATE (PF) 2 MG/ML IV SOLN: 2.0000 mg | INTRAVENOUS | Status: DC | PRN

## 2017-12-28 MED ORDER — MORPHINE SULFATE (PF) 2 MG/ML IV SOLN: 1.0000 mg | INTRAVENOUS | Status: AC | PRN

## 2017-12-28 MED ORDER — ROCURONIUM 10MG/ML (10ML) SYRINGE FOR MEDFUSION PUMP - OPTIME
INTRAVENOUS | Status: DC | PRN
  Administered 2017-12-28: 30 mg via INTRAVENOUS
  Administered 2017-12-28: 40 mg via INTRAVENOUS
  Administered 2017-12-28 (×2): 50 mg via INTRAVENOUS

## 2017-12-28 MED ORDER — DEXMEDETOMIDINE HCL IN NACL 400 MCG/100ML IV SOLN
0.0000 ug/kg/h | INTRAVENOUS | Status: DC
  Administered 2017-12-28: 0.4 ug/kg/h via INTRAVENOUS
  Filled 2017-12-28: qty 100

## 2017-12-28 MED ORDER — EPHEDRINE SULFATE 50 MG/ML IJ SOLN
INTRAMUSCULAR | Status: DC | PRN
  Administered 2017-12-28 (×4): 10 mg via INTRAVENOUS

## 2017-12-28 MED ORDER — MIDAZOLAM HCL 10 MG/2ML IJ SOLN
INTRAMUSCULAR | Status: AC
  Filled 2017-12-28: qty 2

## 2017-12-28 MED ORDER — MIDAZOLAM HCL 5 MG/5ML IJ SOLN
INTRAMUSCULAR | Status: DC | PRN
  Administered 2017-12-28: 2 mg via INTRAVENOUS
  Administered 2017-12-28: 3 mg via INTRAVENOUS
  Administered 2017-12-28: 1 mg via INTRAVENOUS
  Administered 2017-12-28: 4 mg via INTRAVENOUS

## 2017-12-28 MED ORDER — CALCIUM CHLORIDE 10 % IV SOLN
1.0000 g | Freq: Once | INTRAVENOUS | Status: AC
  Administered 2017-12-28: 1 g via INTRAVENOUS

## 2017-12-28 MED ORDER — BISACODYL 10 MG RE SUPP: 10.0000 mg | Freq: Every day | RECTAL | Status: DC

## 2017-12-28 MED ORDER — SUCCINYLCHOLINE CHLORIDE 20 MG/ML IJ SOLN
INTRAMUSCULAR | Status: DC | PRN
  Administered 2017-12-28: 100 mg via INTRAVENOUS

## 2017-12-28 MED ORDER — LACTATED RINGERS IV SOLN
INTRAVENOUS | Status: DC | PRN
  Administered 2017-12-28: 08:00:00 via INTRAVENOUS

## 2017-12-28 MED ORDER — HEPARIN SODIUM (PORCINE) 1000 UNIT/ML IJ SOLN
INTRAMUSCULAR | Status: AC
  Filled 2017-12-28: qty 1

## 2017-12-28 MED ORDER — METOPROLOL TARTRATE 5 MG/5ML IV SOLN: 2.5000 mg | INTRAVENOUS | Status: DC | PRN

## 2017-12-28 MED ORDER — OXYCODONE HCL 5 MG PO TABS
5.0000 mg | ORAL_TABLET | ORAL | Status: DC | PRN
  Administered 2017-12-29: 10 mg via ORAL
  Administered 2017-12-30 – 2018-01-01 (×3): 5 mg via ORAL
  Filled 2017-12-28 (×2): qty 1
  Filled 2017-12-28: qty 2
  Filled 2017-12-28: qty 1

## 2017-12-28 MED ORDER — SODIUM CHLORIDE 0.9% FLUSH: 3.0000 mL | INTRAVENOUS | Status: DC | PRN

## 2017-12-28 MED ORDER — PHENYLEPHRINE HCL-NACL 20-0.9 MG/250ML-% IV SOLN
0.0000 ug/min | INTRAVENOUS | Status: DC
  Administered 2017-12-28: 10 ug/min via INTRAVENOUS
  Filled 2017-12-28: qty 250

## 2017-12-28 MED ORDER — PROPOFOL 10 MG/ML IV BOLUS
INTRAVENOUS | Status: DC | PRN
  Administered 2017-12-28: 50 mg via INTRAVENOUS

## 2017-12-28 MED ORDER — SODIUM CHLORIDE 0.9 % IV SOLN
INTRAVENOUS | Status: DC | PRN
  Administered 2017-12-28: 750 mg via INTRAVENOUS

## 2017-12-28 MED ORDER — SODIUM CHLORIDE 0.9 % IV SOLN
INTRAVENOUS | Status: DC | PRN
  Administered 2017-12-28: 40 ug/min via INTRAVENOUS

## 2017-12-28 MED ORDER — PROTAMINE SULFATE 10 MG/ML IV SOLN
INTRAVENOUS | Status: AC
  Filled 2017-12-28: qty 5

## 2017-12-28 MED ORDER — MIDAZOLAM HCL 2 MG/2ML IJ SOLN: 2.0000 mg | INTRAMUSCULAR | Status: DC | PRN

## 2017-12-28 MED ORDER — NOREPINEPHRINE 4 MG/250ML-% IV SOLN
0.0000 ug/min | INTRAVENOUS | Status: DC
Start: 2017-12-28 — End: 2017-12-28
  Administered 2017-12-28: 5 ug/min via INTRAVENOUS
  Filled 2017-12-28: qty 250

## 2017-12-28 MED ORDER — NITROGLYCERIN IN D5W 200-5 MCG/ML-% IV SOLN: 0.0000 ug/min | INTRAVENOUS | Status: DC

## 2017-12-28 MED ORDER — ASPIRIN 81 MG PO CHEW: 324.0000 mg | CHEWABLE_TABLET | Freq: Every day | ORAL | Status: DC

## 2017-12-28 MED ORDER — 0.9 % SODIUM CHLORIDE (POUR BTL) OPTIME
TOPICAL | Status: DC | PRN
  Administered 2017-12-28: 6000 mL

## 2017-12-28 MED ORDER — SODIUM CHLORIDE 0.45 % IV SOLN
INTRAVENOUS | Status: DC | PRN
  Administered 2017-12-28 (×2): via INTRAVENOUS

## 2017-12-28 MED ORDER — ACETAMINOPHEN 160 MG/5ML PO SOLN: 1000.0000 mg | Freq: Four times a day (QID) | ORAL | Status: DC

## 2017-12-28 MED ORDER — SODIUM CHLORIDE 0.9 % IV SOLN
INTRAVENOUS | Status: DC
  Administered 2017-12-28: 16:00:00 via INTRAVENOUS

## 2017-12-28 MED ORDER — BISACODYL 5 MG PO TBEC
10.0000 mg | DELAYED_RELEASE_TABLET | Freq: Every day | ORAL | Status: DC
  Administered 2017-12-29 – 2018-01-03 (×6): 10 mg via ORAL
  Filled 2017-12-28 (×6): qty 2

## 2017-12-28 MED ORDER — ALBUMIN HUMAN 5 % IV SOLN
250.0000 mL | INTRAVENOUS | Status: AC | PRN
  Administered 2017-12-28 (×3): 250 mL via INTRAVENOUS
  Filled 2017-12-28 (×2): qty 250

## 2017-12-28 MED ORDER — SODIUM CHLORIDE 0.9 % IV SOLN
1.0000 g | Freq: Once | INTRAVENOUS | Status: DC
  Filled 2017-12-28: qty 10

## 2017-12-28 MED ORDER — AMIODARONE HCL IN DEXTROSE 360-4.14 MG/200ML-% IV SOLN
60.0000 mg/h | INTRAVENOUS | Status: DC
Start: 2017-12-28 — End: 2017-12-28
  Filled 2017-12-28: qty 200

## 2017-12-28 MED ORDER — ROCURONIUM BROMIDE 10 MG/ML (PF) SYRINGE
PREFILLED_SYRINGE | INTRAVENOUS | Status: AC
  Filled 2017-12-28: qty 20

## 2017-12-28 MED ORDER — CHLORHEXIDINE GLUCONATE 0.12 % MT SOLN
15.0000 mL | OROMUCOSAL | Status: AC
  Administered 2017-12-28: 15 mL via OROMUCOSAL

## 2017-12-28 MED ORDER — HEMOSTATIC AGENTS (NO CHARGE) OPTIME
TOPICAL | Status: DC | PRN
  Administered 2017-12-28 (×3): 1 via TOPICAL

## 2017-12-28 MED ORDER — CHLORHEXIDINE GLUCONATE CLOTH 2 % EX PADS
6.0000 | MEDICATED_PAD | Freq: Every day | CUTANEOUS | Status: DC
  Administered 2017-12-28 – 2018-01-01 (×5): 6 via TOPICAL

## 2017-12-28 MED ORDER — HEPARIN SODIUM (PORCINE) 1000 UNIT/ML IJ SOLN
INTRAMUSCULAR | Status: DC | PRN
  Administered 2017-12-28: 30000 [IU] via INTRAVENOUS

## 2017-12-28 MED ORDER — VANCOMYCIN HCL IN DEXTROSE 1-5 GM/200ML-% IV SOLN
1000.0000 mg | Freq: Once | INTRAVENOUS | Status: AC
  Administered 2017-12-28: 1000 mg via INTRAVENOUS
  Filled 2017-12-28: qty 200

## 2017-12-28 MED ORDER — MAGNESIUM SULFATE 4 GM/100ML IV SOLN
2.0000 g | Freq: Once | INTRAVENOUS | Status: AC
  Administered 2017-12-28: 2 g via INTRAVENOUS
  Filled 2017-12-28: qty 100

## 2017-12-28 MED ORDER — ORAL CARE MOUTH RINSE
15.0000 mL | Freq: Two times a day (BID) | OROMUCOSAL | Status: DC
  Administered 2017-12-31 – 2018-01-02 (×5): 15 mL via OROMUCOSAL

## 2017-12-28 MED ORDER — AMIODARONE HCL IN DEXTROSE 360-4.14 MG/200ML-% IV SOLN
60.0000 mg/h | INTRAVENOUS | Status: AC
  Administered 2017-12-28: 60 mg/h via INTRAVENOUS
  Filled 2017-12-28: qty 200

## 2017-12-28 MED ORDER — INSULIN REGULAR BOLUS VIA INFUSION
0.0000 [IU] | Freq: Three times a day (TID) | INTRAVENOUS | Status: DC
  Administered 2017-12-29: 4 [IU] via INTRAVENOUS
  Administered 2017-12-29: 1 [IU] via INTRAVENOUS
  Filled 2017-12-28: qty 10

## 2017-12-28 MED ORDER — ALBUMIN HUMAN 25 % IV SOLN
INTRAVENOUS | Status: DC | PRN
  Administered 2017-12-28: 14:00:00 via INTRAVENOUS

## 2017-12-28 MED ORDER — PROTAMINE SULFATE 10 MG/ML IV SOLN
INTRAVENOUS | Status: DC | PRN
  Administered 2017-12-28: 300 mg via INTRAVENOUS

## 2017-12-28 MED ORDER — ACETAMINOPHEN 650 MG RE SUPP
650.0000 mg | Freq: Once | RECTAL | Status: AC
  Administered 2017-12-28: 650 mg via RECTAL

## 2017-12-28 MED ORDER — PHENYLEPHRINE HCL 10 MG/ML IJ SOLN
INTRAMUSCULAR | Status: DC | PRN
  Administered 2017-12-28: 40 ug via INTRAVENOUS
  Administered 2017-12-28 (×3): 120 ug via INTRAVENOUS

## 2017-12-28 MED ORDER — FAMOTIDINE IN NACL 20-0.9 MG/50ML-% IV SOLN
20.0000 mg | Freq: Two times a day (BID) | INTRAVENOUS | Status: DC
  Administered 2017-12-28: 20 mg via INTRAVENOUS

## 2017-12-28 MED ORDER — DOPAMINE-DEXTROSE 3.2-5 MG/ML-% IV SOLN
INTRAVENOUS | Status: DC | PRN
  Administered 2017-12-28: 3 ug/kg/min via INTRAVENOUS

## 2017-12-28 MED ORDER — ACETAMINOPHEN 500 MG PO TABS
1000.0000 mg | ORAL_TABLET | Freq: Four times a day (QID) | ORAL | Status: AC
  Administered 2017-12-28 – 2018-01-02 (×19): 1000 mg via ORAL
  Filled 2017-12-28 (×20): qty 2

## 2017-12-28 MED ORDER — PANTOPRAZOLE SODIUM 40 MG PO TBEC
40.0000 mg | DELAYED_RELEASE_TABLET | Freq: Every day | ORAL | Status: DC
  Administered 2017-12-30 – 2018-01-03 (×5): 40 mg via ORAL
  Filled 2017-12-28 (×5): qty 1

## 2017-12-28 MED ORDER — SODIUM CHLORIDE 0.9% FLUSH: 10.0000 mL | INTRAVENOUS | Status: DC | PRN

## 2017-12-28 MED ORDER — SODIUM CHLORIDE 0.9% FLUSH
3.0000 mL | Freq: Two times a day (BID) | INTRAVENOUS | Status: DC
  Administered 2017-12-29 – 2018-01-02 (×6): 3 mL via INTRAVENOUS

## 2017-12-28 MED ORDER — LACTATED RINGERS IV SOLN
INTRAVENOUS | Status: DC
  Administered 2017-12-28: 16:00:00 via INTRAVENOUS

## 2017-12-28 MED ORDER — SODIUM CHLORIDE 0.9% FLUSH
10.0000 mL | Freq: Two times a day (BID) | INTRAVENOUS | Status: DC
  Administered 2017-12-29 – 2018-01-01 (×6): 10 mL

## 2017-12-28 MED ORDER — ALBUMIN HUMAN 5 % IV SOLN
12.5000 g | Freq: Once | INTRAVENOUS | Status: AC
  Administered 2017-12-28: 12.5 g via INTRAVENOUS

## 2017-12-28 MED ORDER — DOPAMINE-DEXTROSE 3.2-5 MG/ML-% IV SOLN
3.0000 ug/kg/min | INTRAVENOUS | Status: DC
  Administered 2017-12-30: 3 ug/kg/min via INTRAVENOUS
  Filled 2017-12-28 (×2): qty 250

## 2017-12-28 MED ORDER — LACTATED RINGERS IV SOLN: 500.0000 mL | Freq: Once | INTRAVENOUS | Status: DC | PRN

## 2017-12-28 MED ORDER — METOPROLOL TARTRATE 12.5 MG HALF TABLET
12.5000 mg | ORAL_TABLET | Freq: Two times a day (BID) | ORAL | Status: DC
  Administered 2017-12-29 – 2018-01-01 (×7): 12.5 mg via ORAL
  Filled 2017-12-28 (×7): qty 1

## 2017-12-28 MED ORDER — DOCUSATE SODIUM 100 MG PO CAPS
200.0000 mg | ORAL_CAPSULE | Freq: Every day | ORAL | Status: DC
  Administered 2017-12-29 – 2018-01-03 (×6): 200 mg via ORAL
  Filled 2017-12-28 (×6): qty 2

## 2017-12-28 MED ORDER — METOPROLOL TARTRATE 25 MG/10 ML ORAL SUSPENSION: 12.5000 mg | Freq: Two times a day (BID) | ORAL | Status: DC

## 2017-12-28 MED ORDER — SODIUM BICARBONATE 8.4 % IV SOLN
50.0000 meq | Freq: Once | INTRAVENOUS | Status: AC
  Administered 2017-12-28: 50 meq via INTRAVENOUS

## 2017-12-28 MED ORDER — TRAMADOL HCL 50 MG PO TABS
50.0000 mg | ORAL_TABLET | Freq: Two times a day (BID) | ORAL | Status: DC | PRN
  Administered 2017-12-29 – 2017-12-30 (×2): 50 mg via ORAL
  Filled 2017-12-28 (×3): qty 1

## 2017-12-28 MED ORDER — ONDANSETRON HCL 4 MG/2ML IJ SOLN
4.0000 mg | Freq: Four times a day (QID) | INTRAMUSCULAR | Status: DC | PRN
  Administered 2017-12-30: 4 mg via INTRAVENOUS
  Filled 2017-12-28: qty 2

## 2017-12-28 MED ORDER — LACTATED RINGERS IV SOLN
INTRAVENOUS | Status: DC
  Administered 2017-12-28 – 2017-12-30 (×2): via INTRAVENOUS

## 2017-12-28 MED ORDER — CHLORHEXIDINE GLUCONATE 0.12 % MT SOLN
15.0000 mL | Freq: Two times a day (BID) | OROMUCOSAL | Status: DC
  Administered 2017-12-29 – 2018-01-03 (×8): 15 mL via OROMUCOSAL
  Filled 2017-12-28 (×7): qty 15

## 2017-12-28 MED ORDER — PROPOFOL 10 MG/ML IV BOLUS
INTRAVENOUS | Status: AC
  Filled 2017-12-28: qty 20

## 2017-12-28 MED ORDER — SODIUM CHLORIDE 0.9 % IV SOLN: 250.0000 mL | INTRAVENOUS | Status: DC

## 2017-12-28 MED ORDER — FENTANYL CITRATE (PF) 250 MCG/5ML IJ SOLN
INTRAMUSCULAR | Status: AC
  Filled 2017-12-28: qty 25

## 2017-12-28 MED ORDER — SODIUM CHLORIDE 0.9 % IV SOLN
510.0000 mg | INTRAVENOUS | Status: DC
  Administered 2017-12-29: 510 mg via INTRAVENOUS
  Filled 2017-12-28: qty 17

## 2017-12-28 MED ORDER — SODIUM CHLORIDE 0.9 % IV SOLN
INTRAVENOUS | Status: DC
  Administered 2017-12-28: 7.4 [IU]/h via INTRAVENOUS
  Filled 2017-12-28: qty 1

## 2017-12-28 MED FILL — Verapamil HCl IV Soln 2.5 MG/ML: INTRAVENOUS | Qty: 2 | Status: AC

## 2017-12-28 SURGICAL SUPPLY — 78 items
ADH SKN CLS APL DERMABOND .7 (GAUZE/BANDAGES/DRESSINGS) ×2
BAG DECANTER FOR FLEXI CONT (MISCELLANEOUS) ×3 IMPLANT
BANDAGE ACE 4X5 VEL STRL LF (GAUZE/BANDAGES/DRESSINGS) ×3 IMPLANT
BANDAGE ACE 6X5 VEL STRL LF (GAUZE/BANDAGES/DRESSINGS) ×3 IMPLANT
BANDAGE ELASTIC 4 VELCRO ST LF (GAUZE/BANDAGES/DRESSINGS) ×1 IMPLANT
BANDAGE ELASTIC 6 VELCRO ST LF (GAUZE/BANDAGES/DRESSINGS) ×1 IMPLANT
BLADE STERNUM SYSTEM 6 (BLADE) ×3 IMPLANT
BLOOD HAEMOCONCENTR 700 MIDI (MISCELLANEOUS) ×1 IMPLANT
BNDG GAUZE ELAST 4 BULKY (GAUZE/BANDAGES/DRESSINGS) ×3 IMPLANT
CANISTER SUCT 3000ML PPV (MISCELLANEOUS) ×3 IMPLANT
CATH CPB KIT GERHARDT (MISCELLANEOUS) ×3 IMPLANT
CATH THORACIC 28FR (CATHETERS) ×3 IMPLANT
CLIP VESOCCLUDE SM WIDE 24/CT (CLIP) ×1 IMPLANT
COVER SURGICAL LIGHT HANDLE (MISCELLANEOUS) ×1 IMPLANT
CRADLE DONUT ADULT HEAD (MISCELLANEOUS) ×3 IMPLANT
DERMABOND ADVANCED (GAUZE/BANDAGES/DRESSINGS) ×1
DERMABOND ADVANCED .7 DNX12 (GAUZE/BANDAGES/DRESSINGS) IMPLANT
DRAIN CHANNEL 28F RND 3/8 FF (WOUND CARE) ×3 IMPLANT
DRAPE CARDIOVASCULAR INCISE (DRAPES) ×3
DRAPE SLUSH/WARMER DISC (DRAPES) ×3 IMPLANT
DRAPE SRG 135X102X78XABS (DRAPES) ×2 IMPLANT
DRSG AQUACEL AG ADV 3.5X14 (GAUZE/BANDAGES/DRESSINGS) ×3 IMPLANT
ELECT BLADE 4.0 EZ CLEAN MEGAD (MISCELLANEOUS) ×3
ELECT REM PT RETURN 9FT ADLT (ELECTROSURGICAL) ×6
ELECTRODE BLDE 4.0 EZ CLN MEGD (MISCELLANEOUS) ×2 IMPLANT
ELECTRODE REM PT RTRN 9FT ADLT (ELECTROSURGICAL) ×4 IMPLANT
FELT TEFLON 1X6 (MISCELLANEOUS) ×6 IMPLANT
GAUZE SPONGE 4X4 12PLY STRL (GAUZE/BANDAGES/DRESSINGS) ×6 IMPLANT
GAUZE SPONGE 4X4 12PLY STRL LF (GAUZE/BANDAGES/DRESSINGS) ×2 IMPLANT
GLOVE BIO SURGEON STRL SZ 6.5 (GLOVE) ×9 IMPLANT
GLOVE BIOGEL M STRL SZ7.5 (GLOVE) ×1 IMPLANT
GLOVE BIOGEL PI IND STRL 7.0 (GLOVE) IMPLANT
GLOVE BIOGEL PI INDICATOR 7.0 (GLOVE) ×4
GLOVE SS BIOGEL STRL SZ 6.5 (GLOVE) IMPLANT
GLOVE SUPERSENSE BIOGEL SZ 6.5 (GLOVE) ×2
GOWN STRL REUS W/ TWL LRG LVL3 (GOWN DISPOSABLE) ×8 IMPLANT
GOWN STRL REUS W/TWL LRG LVL3 (GOWN DISPOSABLE) ×12
HEMOSTAT POWDER SURGIFOAM 1G (HEMOSTASIS) ×9 IMPLANT
HEMOSTAT SURGICEL 2X14 (HEMOSTASIS) ×4 IMPLANT
KIT BASIN OR (CUSTOM PROCEDURE TRAY) ×3 IMPLANT
KIT CATH SUCT 8FR (CATHETERS) ×3 IMPLANT
KIT SUCTION CATH 14FR (SUCTIONS) ×6 IMPLANT
KIT TURNOVER KIT B (KITS) ×3 IMPLANT
KIT VASOVIEW HEMOPRO VH 3000 (KITS) ×3 IMPLANT
LEAD PACING MYOCARDI (MISCELLANEOUS) ×3 IMPLANT
MARKER GRAFT CORONARY BYPASS (MISCELLANEOUS) ×9 IMPLANT
NS IRRIG 1000ML POUR BTL (IV SOLUTION) ×15 IMPLANT
PACK E OPEN HEART (SUTURE) ×3 IMPLANT
PACK OPEN HEART (CUSTOM PROCEDURE TRAY) ×3 IMPLANT
PAD ARMBOARD 7.5X6 YLW CONV (MISCELLANEOUS) ×6 IMPLANT
PAD ELECT DEFIB RADIOL ZOLL (MISCELLANEOUS) ×3 IMPLANT
PENCIL BUTTON HOLSTER BLD 10FT (ELECTRODE) ×3 IMPLANT
PUNCH AORTIC ROTATE  4.5MM 8IN (MISCELLANEOUS) ×1 IMPLANT
SET CARDIOPLEGIA MPS 5001102 (MISCELLANEOUS) ×1 IMPLANT
SPONGE LAP 18X18 RF (DISPOSABLE) ×2 IMPLANT
SUT BONE WAX W31G (SUTURE) ×3 IMPLANT
SUT MNCRL AB 4-0 PS2 18 (SUTURE) ×1 IMPLANT
SUT PROLENE 3 0 SH1 36 (SUTURE) ×4 IMPLANT
SUT PROLENE 4 0 TF (SUTURE) ×6 IMPLANT
SUT PROLENE 6 0 CC (SUTURE) ×8 IMPLANT
SUT PROLENE 7 0 BV1 MDA (SUTURE) ×3 IMPLANT
SUT PROLENE 7.0 RB 3 (SUTURE) ×3 IMPLANT
SUT PROLENE 8 0 BV175 6 (SUTURE) ×2 IMPLANT
SUT SILK 2 0 SH CR/8 (SUTURE) ×1 IMPLANT
SUT STEEL 6MS V (SUTURE) ×3 IMPLANT
SUT STEEL SZ 6 DBL 3X14 BALL (SUTURE) ×3 IMPLANT
SUT VIC AB 1 CTX 18 (SUTURE) ×6 IMPLANT
SUT VIC AB 2-0 CT1 27 (SUTURE) ×3
SUT VIC AB 2-0 CT1 TAPERPNT 27 (SUTURE) IMPLANT
SYSTEM SAHARA CHEST DRAIN ATS (WOUND CARE) ×3 IMPLANT
TAPE SURG TRANSPORE 1 IN (GAUZE/BANDAGES/DRESSINGS) IMPLANT
TAPE SURGICAL TRANSPORE 1 IN (GAUZE/BANDAGES/DRESSINGS) ×1
TOWEL GREEN STERILE (TOWEL DISPOSABLE) ×3 IMPLANT
TOWEL GREEN STERILE FF (TOWEL DISPOSABLE) ×3 IMPLANT
TRAY FOLEY SLVR 16FR TEMP STAT (SET/KITS/TRAYS/PACK) ×3 IMPLANT
TUBING INSUFFLATION (TUBING) ×3 IMPLANT
UNDERPAD 30X30 (UNDERPADS AND DIAPERS) ×3 IMPLANT
WATER STERILE IRR 1000ML POUR (IV SOLUTION) ×6 IMPLANT

## 2017-12-28 NOTE — Anesthesia Procedure Notes (Signed)
Central Venous Catheter Insertion Performed by: Duane Boston, MD, anesthesiologist Start/End8/14/2019 7:59 AM, 12/28/2017 8:09 AM Patient location: Pre-op. Preanesthetic checklist: patient identified, IV checked, site marked, risks and benefits discussed, surgical consent, monitors and equipment checked, pre-op evaluation, timeout performed and anesthesia consent Position: Trendelenburg Lidocaine 1% used for infiltration and patient sedated Hand hygiene performed , maximum sterile barriers used  and Seldinger technique used Catheter size: 8.5 Fr Total catheter length 8. PA cath was placed.Sheath introducer Swan type:thermodilution PA Cath depth:50 Procedure performed using ultrasound guided technique. Ultrasound Notes:anatomy identified, needle tip was noted to be adjacent to the nerve/plexus identified, no ultrasound evidence of intravascular and/or intraneural injection and image(s) printed for medical record Attempts: 1 Following insertion, line sutured, dressing applied and Biopatch. Post procedure assessment: free fluid flow, blood return through all ports and no air  Patient tolerated the procedure well with no immediate complications.

## 2017-12-28 NOTE — Progress Notes (Addendum)
Patient converted to SR at 0344.  Md notified.

## 2017-12-28 NOTE — Procedures (Signed)
Extubation Procedure Note  Patient Details:   Name: Jeorge Reister DOB: Feb 10, 1932 MRN: [id number]   Airway Documentation:    Vent end date: 12/28/17 Vent end time: 2202  Breath sounds clear diminished VC 1.8 NIF -29 Positive cuff leak  Evaluation  O2 sats: stable throughout Complications: No apparent complications Patient did tolerate procedure well. Bilateral Breath Sounds: Clear, Diminished   Yes  Milinda Cave 12/28/2017, 10:08 PM

## 2017-12-28 NOTE — Progress Notes (Signed)
Subjective: Interval History: just back from CABG.  Had UF on bypass  Objective: Vital signs in last 24 hours: Temp:  [98 F (36.7 C)-98.5 F (36.9 C)] 98 F (36.7 C) (08/14 0358) Pulse Rate:  [78-99] 80 (08/14 1613) Resp:  [15-25] 15 (08/14 1613) BP: (92-124)/(48-76) 92/48 (08/14 1613) SpO2:  [97 %-99 %] 99 % (08/14 1613) FiO2 (%):  [50 %] 50 % (08/14 1613) Weight:  [98.5 kg] 98.5 kg (08/14 0358) Weight change: 1.678 kg  Intake/Output from previous day: 08/13 0701 - 08/14 0700 In: 2061.6 [P.O.:960; I.V.:1101.6] Out: 525 [Urine:525] Intake/Output this shift: Total I/O In: 5100 [I.V.:3350; Blood:550; IV Piggyback:1200] Out: 1875 [Urine:675; Blood:1200]  General appearance: moderately obese, pale and on vent, sedated, CT, pacer, ET, central lines eval Resp: rhonchi bilaterally and scattered Cardio: S1, S2 normal GI: no bs, mild distension , soft Extremities: edema 1+, cool feet and cyanotic  Lab Results: Recent Labs    12/27/17 0232 12/28/17 0458  12/28/17 1332  12/28/17 1439 12/28/17 1506  WBC 10.8* 11.6*  --   --   --   --   --   HGB 10.3* 10.0*   < > 8.3*   < > 7.1* 9.9*  HCT 31.6* 30.9*   < > 25.8*   < > 21.0* 29.0*  PLT 153 150  --  144*  --   --   --    < > = values in this interval not displayed.   BMET:  Recent Labs    12/27/17 0232 12/28/17 0458  12/28/17 1439 12/28/17 1506  NA 138 134*   < > 142 137  K 4.2 4.8   < > 3.5 4.4  CL 105 104   < > 110 105  CO2 24 21*  --   --   --   GLUCOSE 113* 126*   < > 153* 187*  BUN 30* 32*   < > 25* 30*  CREATININE 2.02* 2.06*   < > 1.40* 1.90*  CALCIUM 8.5* 8.5*  --   --   --    < > = values in this interval not displayed.   Recent Labs    12/27/17 1136  PTH 37   Iron Studies:  Recent Labs    12/27/17 1136  IRON 15*  TIBC 314    Studies/Results: US Renal  Result Date: 12/26/2017 CLINICAL DATA:  Renal failure EXAM: RENAL / URINARY TRACT ULTRASOUND COMPLETE COMPARISON:  CT scan 12/21/2017 FINDINGS:  Right Kidney: Length: 13.1 cm. No hydronephrosis. Adjacent large cysts noted towards the lower pole. Dominant cyst measures up to 7.8 cm. Imaging features similar to recent CT scan is stable to mildly progressed compared to CT scan of 04/11/2017. Left Kidney: Length: Cortical atrophy without hydronephrosis. Increased echogenicity. Moderately distended. Bladder: Appears normal for degree of bladder distention. IMPRESSION: No evidence for hydronephrosis. Large right renal cysts as noted at on previous studies. Left renal atrophy and increased echogenicity of the renal parenchyma. Electronically Signed   By: Misty Stanley M.D.   On: 12/26/2017 17:01    I have reviewed the patient's current medications.  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 K ok. Will follow Cr, vol, urine, acid/base 2 Anemia Fe low bolus 3 HPTH PTH ok 4 CAD per CVS 5 DJD P follow as above, Fe tomorrow.  Postop care    LOS: 6 days   Jeneen Rinks Kalya Troeger 12/28/2017,4:24 PM

## 2017-12-28 NOTE — Transfer of Care (Signed)
Immediate Anesthesia Transfer of Care Note  Patient: [name]  Procedure(s) Performed: CORONARY ARTERY BYPASS GRAFTING (CABG) USING LEFT INTERNAL MAMMARY ARTERY TO LAD  AND ENDOSCOPICALLY HARVESTED RIGHT SAPHENOUS VEIN TO DIAGONAL, CIRC AND PDA (N/A Chest) TRANSESOPHAGEAL ECHOCARDIOGRAM (TEE) (N/A )  Patient Location: SICU  Anesthesia Type:General  Level of Consciousness: sedated and Patient remains intubated per anesthesia plan  Airway & Oxygen Therapy: Patient remains intubated per anesthesia plan and Patient placed on Ventilator (see vital sign flow sheet for setting)  Post-op Assessment: Report given to RN and Post -op Vital signs reviewed and stable  Post vital signs: Reviewed and stable  Last Vitals:  Vitals Value Taken Time  BP 89/61 12/28/2017  4:21 PM  Temp 36.3 C 12/28/2017  4:24 PM  Pulse 80 12/28/2017  4:13 PM  Resp 18 12/28/2017  4:24 PM  SpO2 99 % 12/28/2017  4:13 PM  Vitals shown include unvalidated device data.  Last Pain:  Vitals:   12/28/17 0358  TempSrc: Oral  PainSc:       Patients Stated Pain Goal: 0 (47/65/46 5035)  Complications: No apparent anesthesia complications

## 2017-12-28 NOTE — Progress Notes (Signed)
CT Surgery  84 yo remasins sedated on vent preop creat 1.n renal dopamine woith u/o 30/hr Cardiac index > 2 hct 31

## 2017-12-28 NOTE — Anesthesia Procedure Notes (Signed)
Arterial Line Insertion Start/End8/14/2019 7:40 AM, 12/28/2017 7:45 AM Performed by: Laretta Alstrom, CRNA, CRNA  Patient location: Pre-op. Preanesthetic checklist: patient identified, IV checked, site marked, risks and benefits discussed, surgical consent, monitors and equipment checked, pre-op evaluation, timeout performed and anesthesia consent Lidocaine 1% used for infiltration Left, radial was placed Catheter size: 20 G Maximum sterile barriers used   Attempts: 1 Procedure performed without using ultrasound guided technique. Following insertion, dressing applied and Biopatch. Post procedure assessment: unchanged  Patient tolerated the procedure well with no immediate complications.

## 2017-12-28 NOTE — Progress Notes (Signed)
      [address]       [place] [address]             [phone]     Pre Procedure note for inpatients:   [name] has been scheduled for Procedure(s): LEFT HEART CATH AND CORONARY ANGIOGRAPHY (N/A) today. The various methods of treatment have been discussed with the patient. After consideration of the risks, benefits and treatment options the patient has consented to the planned procedure.   The patient has been seen and labs reviewed. There are no changes in the patient's condition to prevent proceeding with the planned procedure today.  Recent labs:  Lab Results  Component Value Date   WBC 11.6 (H) 12/28/2017   HGB 10.0 (L) 12/28/2017   HCT 30.9 (L) 12/28/2017   PLT 150 12/28/2017   GLUCOSE 126 (H) 12/28/2017   CHOL 112 12/23/2017   TRIG 52 12/23/2017   HDL 37 (L) 12/23/2017   LDLCALC 65 12/23/2017   ALT 18 12/26/2017   AST 18 12/26/2017   NA 134 (L) 12/28/2017   K 4.8 12/28/2017   CL 104 12/28/2017   CREATININE 2.06 (H) 12/28/2017   BUN 32 (H) 12/28/2017   CO2 21 (L) 12/28/2017   INR 1.17 12/23/2017   HGBA1C 5.8 (H) 12/24/2017   Cr remains at 1.7 to 2. Baseline with chronic renal insufficiency, Patient and family aware of increased risk of cabg with chronic renal efficiency   Grace Isaac, MD 12/28/2017 6:54 AM

## 2017-12-28 NOTE — Progress Notes (Signed)
Rapid wean terminated. Temp not in parameters.

## 2017-12-28 NOTE — Brief Op Note (Addendum)
      [address]       [place] [address]             [phone]     12/28/2017  11:52 AM  PATIENT:  [name]  84 y.o. male  PRE-OPERATIVE DIAGNOSIS:  CAD  POST-OPERATIVE DIAGNOSIS: 1. CAD 2. SEVERE PERICARDITIS 3. LARGE BLOODY PERICARDIAL EFFUSION (pending tamponade)  PROCEDURE: TRANSESOPHAGEAL ECHOCARDIOGRAM (TEE), MEDIAN STERNOTOMY for CORONARY ARTERY BYPASS GRAFTING (CABG) x 4  (LIMA to LAD, SVG to DIAGONAL, SVG to CIRCUMFLEX, SVG to PDA) USING LEFT INTERNAL MAMMARY ARTERY TO AND ENDOSCOPICALLY HARVESTED RIGHT GREATER SAPHENOUS VEIN   SURGEON:  Surgeon(s) and Role:    Grace Isaac, MD - Primary  PHYSICIAN ASSISTANT: Lars Pinks PA-C  ASSISTANTS: Vernie Murders RNFA   ANESTHESIA:   general  EBL:  1200 mL   DRAINS: Chest tubes placed in the mediastinal and pleural spaces   COUNTS CORRECT:  NO  DICTATION: .Dragon Dictation  PLAN OF CARE: Admit to inpatient   PATIENT DISPOSITION:  ICU - intubated and hemodynamically stable.   Delay start of Pharmacological VTE agent (>24hrs) due to surgical blood loss or risk of bleeding: yes  BASELINE WEIGHT: 98.5 kg

## 2017-12-28 NOTE — OR Nursing (Signed)
sicu 1st call to tonya at 14:52 sicu 2nd call to [place] at 1528 sicu 3rd call to tiffany at 1557

## 2017-12-28 NOTE — Progress Notes (Signed)
Rapid wean initiated

## 2017-12-29 ENCOUNTER — Inpatient Hospital Stay (HOSPITAL_COMMUNITY): Payer: Medicare Other

## 2017-12-29 ENCOUNTER — Encounter (HOSPITAL_COMMUNITY): Payer: Self-pay | Admitting: Cardiothoracic Surgery

## 2017-12-29 DIAGNOSIS — Z951 Presence of aortocoronary bypass graft: Secondary | ICD-10-CM

## 2017-12-29 LAB — GLUCOSE, CAPILLARY
GLUCOSE-CAPILLARY: 115 mg/dL — AB (ref 70–99)
GLUCOSE-CAPILLARY: 130 mg/dL — AB (ref 70–99)
GLUCOSE-CAPILLARY: 140 mg/dL — AB (ref 70–99)
GLUCOSE-CAPILLARY: 150 mg/dL — AB (ref 70–99)
GLUCOSE-CAPILLARY: 157 mg/dL — AB (ref 70–99)
Glucose-Capillary: 151 mg/dL — ABNORMAL HIGH (ref 70–99)
Glucose-Capillary: 117 mg/dL — ABNORMAL HIGH (ref 70–99)
Glucose-Capillary: 112 mg/dL — ABNORMAL HIGH (ref 70–99)
Glucose-Capillary: 127 mg/dL — ABNORMAL HIGH (ref 70–99)
Glucose-Capillary: 132 mg/dL — ABNORMAL HIGH (ref 70–99)
Glucose-Capillary: 132 mg/dL — ABNORMAL HIGH (ref 70–99)
Glucose-Capillary: 139 mg/dL — ABNORMAL HIGH (ref 70–99)
Glucose-Capillary: 129 mg/dL — ABNORMAL HIGH (ref 70–99)
Glucose-Capillary: 145 mg/dL — ABNORMAL HIGH (ref 70–99)
Glucose-Capillary: 128 mg/dL — ABNORMAL HIGH (ref 70–99)
Glucose-Capillary: 148 mg/dL — ABNORMAL HIGH (ref 70–99)
Glucose-Capillary: 107 mg/dL — ABNORMAL HIGH (ref 70–99)
Glucose-Capillary: 120 mg/dL — ABNORMAL HIGH (ref 70–99)

## 2017-12-29 LAB — BASIC METABOLIC PANEL
ANION GAP: 10 (ref 5–15)
BUN: 28 mg/dL — ABNORMAL HIGH (ref 8–23)
CALCIUM: 8.1 mg/dL — AB (ref 8.9–10.3)
CHLORIDE: 107 mmol/L (ref 98–111)
CO2: 20 mmol/L — ABNORMAL LOW (ref 22–32)
Creatinine, Ser: 1.76 mg/dL — ABNORMAL HIGH (ref 0.61–1.24)
GFR calc non Af Amer: 34 mL/min — ABNORMAL LOW (ref >60)
GFR, EST AFRICAN AMERICAN: 39 mL/min — AB (ref >60)
GLUCOSE: 122 mg/dL — AB (ref 70–99)
POTASSIUM: 4.6 mmol/L (ref 3.5–5.1)
Sodium: 137 mmol/L (ref 135–145)

## 2017-12-29 LAB — CREATININE, SERUM
Creatinine, Ser: 1.77 mg/dL — ABNORMAL HIGH (ref 0.61–1.24)
GFR calc non Af Amer: 34 mL/min — ABNORMAL LOW (ref >60)
GFR, EST AFRICAN AMERICAN: 39 mL/min — AB (ref >60)

## 2017-12-29 LAB — POCT I-STAT, CHEM 8
BUN: 26 mg/dL — AB (ref 8–23)
CALCIUM ION: 1.2 mmol/L (ref 1.15–1.40)
CHLORIDE: 104 mmol/L (ref 98–111)
Creatinine, Ser: 1.8 mg/dL — ABNORMAL HIGH (ref 0.61–1.24)
GLUCOSE: 119 mg/dL — AB (ref 70–99)
HCT: 23 % — ABNORMAL LOW (ref 39.0–52.0)
Hemoglobin: 7.8 g/dL — ABNORMAL LOW (ref 13.0–17.0)
Potassium: 4.7 mmol/L (ref 3.5–5.1)
Sodium: 136 mmol/L (ref 135–145)
TCO2: 20 mmol/L — ABNORMAL LOW (ref 22–32)

## 2017-12-29 LAB — CBC
HEMATOCRIT: 23.9 % — AB (ref 39.0–52.0)
HEMATOCRIT: 23 % — AB (ref 39.0–52.0)
HEMOGLOBIN: 7.8 g/dL — AB (ref 13.0–17.0)
Hemoglobin: 7.5 g/dL — ABNORMAL LOW (ref 13.0–17.0)
MCH: 30.7 pg (ref 26.0–34.0)
MCH: 30.9 pg (ref 26.0–34.0)
MCHC: 32.6 g/dL (ref 30.0–36.0)
MCHC: 32.6 g/dL (ref 30.0–36.0)
MCV: 94.1 fL (ref 78.0–100.0)
MCV: 94.7 fL (ref 78.0–100.0)
Platelets: 126 10*3/uL — ABNORMAL LOW (ref 150–400)
Platelets: 130 10*3/uL — ABNORMAL LOW (ref 150–400)
RBC: 2.54 MIL/uL — AB (ref 4.22–5.81)
RBC: 2.43 MIL/uL — ABNORMAL LOW (ref 4.22–5.81)
RDW: 14.6 % (ref 11.5–15.5)
RDW: 14.6 % (ref 11.5–15.5)
WBC: 11.8 10*3/uL — ABNORMAL HIGH (ref 4.0–10.5)
WBC: 12.7 10*3/uL — AB (ref 4.0–10.5)

## 2017-12-29 LAB — MAGNESIUM
Magnesium: 2.4 mg/dL (ref 1.7–2.4)
Magnesium: 2.3 mg/dL (ref 1.7–2.4)

## 2017-12-29 MED ORDER — INSULIN ASPART 100 UNIT/ML ~~LOC~~ SOLN: 0.0000 [IU] | SUBCUTANEOUS | Status: DC

## 2017-12-29 MED ORDER — INSULIN DETEMIR 100 UNIT/ML ~~LOC~~ SOLN
8.0000 [IU] | Freq: Every day | SUBCUTANEOUS | Status: DC
  Filled 2017-12-29: qty 0.08

## 2017-12-29 MED ORDER — INSULIN DETEMIR 100 UNIT/ML ~~LOC~~ SOLN
8.0000 [IU] | Freq: Once | SUBCUTANEOUS | Status: AC
  Administered 2017-12-29: 8 [IU] via SUBCUTANEOUS
  Filled 2017-12-29: qty 0.08

## 2017-12-29 MED FILL — Potassium Chloride Inj 2 mEq/ML: INTRAVENOUS | Qty: 40 | Status: AC

## 2017-12-29 MED FILL — Heparin Sodium (Porcine) Inj 1000 Unit/ML: INTRAMUSCULAR | Qty: 30 | Status: AC

## 2017-12-29 MED FILL — Magnesium Sulfate Inj 50%: INTRAMUSCULAR | Qty: 10 | Status: AC

## 2017-12-29 NOTE — Progress Notes (Signed)
Progress Note  Patient Name: [name] Date of Encounter: 12/29/2017  Primary Cardiologist: TT, will follow up in [place]   S/p CABG - noted to have Afib with RVR on 8/13, started on amiodarone. Remained intubated until last evening after CABG yesterday. Today he reports feeling well, no chest pain. On IV amiodarone - low dose dopamine. Mean PAP 22.  Inpatient Medications    Scheduled Meds: . acetaminophen  1,000 mg Oral Q6H   Or  . acetaminophen (TYLENOL) oral liquid 160 mg/5 mL  1,000 mg Per Tube Q6H  . allopurinol  300 mg Oral q morning - 10a  . aspirin EC  325 mg Oral Daily   Or  . aspirin  324 mg Per Tube Daily  . atorvastatin  40 mg Oral q1800  . bisacodyl  10 mg Oral Daily   Or  . bisacodyl  10 mg Rectal Daily  . chlorhexidine  15 mL Mouth Rinse BID  . Chlorhexidine Gluconate Cloth  6 each Topical Daily  . docusate sodium  200 mg Oral Daily  . insulin regular  0-10 Units Intravenous TID WC  . mouth rinse  15 mL Mouth Rinse q12n4p  . metoprolol tartrate  12.5 mg Oral BID   Or  . metoprolol tartrate  12.5 mg Per Tube BID  . multivitamin with minerals  1 tablet Oral Daily  . [START ON 12/30/2017] pantoprazole  40 mg Oral Daily  . sertraline  50 mg Oral Daily  . sodium chloride flush  10-40 mL Intracatheter Q12H  . sodium chloride flush  3 mL Intravenous Q12H  . tamsulosin  0.4 mg Oral QPC supper  . vitamin B-12  500 mcg Oral Daily   Continuous Infusions: . sodium chloride Stopped (12/29/17 0956)  . sodium chloride    . sodium chloride 1 mL/hr at 12/28/17 1610  . albumin human 250 mL (12/28/17 2210)  . amiodarone 30 mg/hr (12/29/17 1000)  . cefUROXime (ZINACEF)  IV Stopped (12/29/17 0747)  . dexmedetomidine (PRECEDEX) IV infusion Stopped (12/28/17 1900)  . DOPamine 3 mcg/kg/min (12/29/17 1000)  . ferumoxytol 510 mg (12/29/17 0956)  . insulin (NOVOLIN-R) infusion 1.7 mL/hr at 12/29/17 1000  . lactated ringers    . lactated ringers 10 mL/hr at  12/28/17 1610  . lactated ringers 20 mL/hr at 12/29/17 1000  . nitroGLYCERIN Stopped (12/28/17 1610)  . phenylephrine (NEO-SYNEPHRINE) Adult infusion Stopped (12/29/17 0343)   PRN Meds: sodium chloride, albumin human, lactated ringers, metoprolol tartrate, midazolam, morphine injection, ondansetron (ZOFRAN) IV, oxyCODONE, sodium chloride flush, sodium chloride flush, traMADol   Vital Signs    Vitals:   12/29/17 0720 12/29/17 0800 12/29/17 0900 12/29/17 1000  BP:  (!) 113/56 119/74 117/65  Pulse: 81  89 83  Resp: (!) 23 (!) 23 19 (!) 22  Temp: 97.9 F (36.6 C) 98.1 F (36.7 C) 98.2 F (36.8 C) 98.1 F (36.7 C)  TempSrc: Core Core Core Core  SpO2: 93% 91% 94% 91%  Weight:      Height:        Intake/Output Summary (Last 24 hours) at 12/29/2017 1058 Last data filed at 12/29/2017 1000 Gross per 24 hour  Intake 8220.4 ml  Output 3390 ml  Net 4830.4 ml   Filed Weights   12/27/17 0550 12/28/17 0358 12/29/17 0600  Weight: 96.8 kg 98.5 kg 102.7 kg    Telemetry    Sinus rhythm - Personally Reviewed  ECG    Sinus with 1st degree AVB at  81 - Personally Reviewed  Physical Exam   General appearance: alert, no distress and post-CABG Neck: no carotid bruit, no JVD and thyroid not enlarged, symmetric, no tenderness/mass/nodules Lungs: clear to auscultation bilaterally Heart: regular rate and rhythm Abdomen: soft, non-tender; bowel sounds normal; no masses,  no organomegaly Extremities: extremities normal, atraumatic, no cyanosis or edema Pulses: 2+ and symmetric Skin: Skin color, texture, turgor normal. No rashes or lesions Neurologic: Grossly normal Psych: Pleasant  Labs    Chemistry Recent Labs  Lab 12/23/17 0445  12/26/17 0323 12/27/17 0232 12/28/17 0458  12/28/17 1506 12/28/17 1622 12/28/17 2159 12/28/17 2209 12/29/17 0343  NA 137   < > 136 138 134*   < > 137 138 137  --  137  K 4.8   < > 4.6 4.2 4.8   < > 4.4 4.2 4.5  --  4.6  CL 105   < > 105 105 104   < >  105  --  106  --  107  CO2 19*   < > 23 24 21*  --   --   --   --   --  20*  GLUCOSE 90   < > 126* 113* 126*   < > 187* 166* 172*  --  122*  BUN 24*   < > 28* 30* 32*   < > 30*  --  28*  --  28*  CREATININE 1.71*   < > 1.77* 2.02* 2.06*   < > 1.90*  --  1.80* 1.85* 1.76*  CALCIUM 8.5*   < > 8.4* 8.5* 8.5*  --   --   --   --   --  8.1*  PROT 5.8*  --  6.4*  --   --   --   --   --   --   --   --   ALBUMIN 3.2*  --  3.3*  --  2.9*  --   --   --   --   --   --   AST 16  --  18  --   --   --   --   --   --   --   --   ALT 14  --  18  --   --   --   --   --   --   --   --   ALKPHOS 48  --  54  --   --   --   --   --   --   --   --   BILITOT 1.1  --  1.0  --   --   --   --   --   --   --   --   GFRNONAA 35*   < > 34* 29* 28*  --   --   --   --  32* 34*  GFRAA 41*   < > 39* 33* 32*  --   --   --   --  37* 39*  ANIONGAP 13   < > 8 9 9   --   --   --   --   --  10   < > = values in this interval not displayed.     Hematology Recent Labs  Lab 12/28/17 1633 12/28/17 2159 12/28/17 2209 12/29/17 0343  WBC 15.2*  --  15.0* 11.8*  RBC 3.33*  --  2.60* 2.54*  HGB 10.2* 8.5* 8.0* 7.8*  HCT  31.4* 25.0* 24.7* 23.9*  MCV 94.3  --  95.0 94.1  MCH 30.6  --  30.8 30.7  MCHC 32.5  --  32.4 32.6  RDW 14.4  --  14.6 14.6  PLT 118*  --  122* 126*    Cardiac EnzymesNo results for input(s): TROPONINI in the last 168 hours. No results for input(s): TROPIPOC in the last 168 hours.   BNPNo results for input(s): BNP, PROBNP in the last 168 hours.   DDimer No results for input(s): DDIMER in the last 168 hours.   Radiology    Dg Chest Port 1 View  Result Date: 12/29/2017 CLINICAL DATA:  Status post CABG. Chest tube remains in place. EXAM: PORTABLE CHEST 1 VIEW COMPARISON:  Yesterday. FINDINGS: The endotracheal and nasogastric tubes have been removed. Mediastinal and left chest tubes remain in place without pneumothorax. The right jugular Swan-Ganz catheter tip is in the right main pulmonary artery. Mildly  increased patchy opacity in the left lower lobe and interval minimal right basilar atelectasis. Possible small left pleural effusion. Stable enlarged cardiac silhouette and post CABG changes. Lower thoracic spine degenerative changes. IMPRESSION: 1. Increased left lower lobe atelectasis or pneumonia. 2. Interval mild right basilar atelectasis. 3. Possible small left pleural effusion. 4. No pneumothorax. Electronically Signed   By: Claudie Revering M.D.   On: 12/29/2017 09:16   Dg Chest Port 1 View  Result Date: 12/28/2017 CLINICAL DATA:  Intubated EXAM: PORTABLE CHEST 1 VIEW COMPARISON:  12/24/2017, 12/21/2017 FINDINGS: Endotracheal tube tip is about 5.5 cm superior to the carina. Esophageal tube tip is below the diaphragm but incompletely imaged. Right IJ Swan-Ganz catheter tip overlies the pulmonary outflow tract. Interval sternotomy. Placement mediastinal and left chest drainage catheters, tip of the left catheter overlies the left lung apex. No definitive pneumothorax seen. Small left effusion and basilar atelectasis. Cardiomegaly with aortic atherosclerosis. IMPRESSION: 1. Placement of support lines and tubes as above. No definitive pneumothorax is seen 2. Interval sternotomy changes. Small left pleural effusion and left basilar atelectasis 3. Mild cardiomegaly Electronically Signed   By: Donavan Foil M.D.   On: 12/28/2017 16:58    Cardiac Studies   Left heart cath 12/23/17:  Mid RCA-1 lesion is 80% stenosed.  Mid RCA-2 lesion is 70% stenosed.  Prox RCA lesion is 60% stenosed.  Prox LAD lesion is 90% stenosed.  Ost 1st Mrg to 1st Mrg lesion is 90% stenosed.  Mid Cx lesion is 100% stenosed.  1. Severe 3 vessel obstructive CAD with diffuse coronary calcification - 90% proximal LAD at bifurcation of moderate sized diagonal branch - 100% mid LCx occlusion. Left to left collaterals to a large OM branch - diffuse proximal to mid RCA disease up to 80%.  Plan: will get an Echo to  assess for valvular disease. Given complexity of disease I would recommend CVTS consult for potential CABG. Will resume IV heparin.    Echo 12/24/17: Study Conclusions - Left ventricle: The cavity size was normal. There was moderate focal basal hypertrophy of the septum. Systolic function was normal. The estimated ejection fraction was in the range of 50% to 55%. Wall motion was normal; there were no regional wall motion abnormalities. Doppler parameters are consistent with abnormal left ventricular relaxation (grade 1 diastolic dysfunction). Doppler parameters are consistent with high ventricular filling pressure. - Aortic valve: There was trivial regurgitation. - Ascending aorta: The ascending aorta was mildly dilated. - Mitral valve: There was mild regurgitation. - Atrial septum: There was increased thickness of  the septum, consistent with lipomatous hypertrophy.  Impressions: - Normal LV systolic function; mild diastolic dysfunction; sclerotic aortic valve with trace AI; mildly dilated ascending aorta; mild MR.  Patient Profile     84 y.o. male with a history of BPH, Anemia, HTN, HOH, OA, colon polyps. He was [facility] with complaints of chest pain and shoulder pain and chest CT showed no evidence of PE but did show three-vessel coronary artery calcifications. Nuclear stress test showed inferior lateral ischemia and he is transferred to [facility] for cath. Cath showed 3v disease and TCTS was consulted. Plan for CABG on Wed.  Assessment & Plan    1. Chest pain, heart cath with severe 3v disease: 80/70% mid RCA, prox 60% RCA, 90% pLAD, 90% ostial OM1 and occluded mLCx - s/p CABG - on low dose dopamine. Mean PAP 22.  2. HTN - on low dose dopamine. BP has been normal.  3. Acute on chronic kidney disease stage 3 - per nephrology  4. HLD - 12/23/2017: Cholesterol 112; HDL 37; LDL Cholesterol 65; Triglycerides 52; VLDL 10 -  continue statin  5. Atrial fibrillation - started on IV amiodarone - maintaining sinus. Would continue today and can likely switch to po amiodarone 400 mg daily tomorrow.  For questions or updates, please contact [place] Please consult [email or web address] for contact info under Cardiology/STEMI.   Pixie Casino, MD, [facility], [place] Director of the Advanced Lipid Disorders &  Cardiovascular Risk Reduction Clinic Diplomate of the American Board of Clinical Lipidology Attending Cardiologist  Direct Dial: [phone]  Fax: [phone]  Website:  [email or web address]  Pixie Casino, MD  12/29/2017, 10:58 AM

## 2017-12-29 NOTE — Progress Notes (Signed)
Subjective: Interval History: has complaints sore.  Objective: Vital signs in last 24 hours: Temp:  [96.3 F (35.7 C)-98.1 F (36.7 C)] 98.1 F (36.7 C) (08/15 0700) Pulse Rate:  [76-90] 76 (08/15 0700) Resp:  [0-25] 21 (08/15 0700) BP: (88-142)/(48-72) 111/70 (08/15 0700) SpO2:  [86 %-100 %] 90 % (08/15 0700) Arterial Line BP: (91-131)/(43-70) 121/46 (08/15 0700) FiO2 (%):  [40 %-60 %] 40 % (08/14 2139) Weight:  [102.7 kg] 102.7 kg (08/15 0600) Weight change: 4.179 kg  Intake/Output from previous day: 08/14 0701 - 08/15 0700 In: 7097.2 [I.V.:4946.8; Blood:550; IV Piggyback:1600.5] Out: 2585 [Urine:1620; Blood:1200; Chest Tube:340] Intake/Output this shift: No intake/output data recorded.  General appearance: alert, cooperative, mild distress and pale Resp: rales bibasilar and rhonchi bibasilar Cardio: S1, S2 normal and systolic murmur: systolic ejection 2/6, decrescendo at 2nd left intercostal space, Pacer, mediastinal tubes GI: soft, non-tender; bowel sounds normal; no masses,  no organomegaly Extremities: vein harvest sites  Lab Results: Recent Labs    12/28/17 2209 12/29/17 0343  WBC 15.0* 11.8*  HGB 8.0* 7.8*  HCT 24.7* 23.9*  PLT 122* 126*   BMET:  Recent Labs    12/28/17 0458  12/28/17 2159 12/28/17 2209 12/29/17 0343  NA 134*   < > 137  --  137  K 4.8   < > 4.5  --  4.6  CL 104   < > 106  --  107  CO2 21*  --   --   --  20*  GLUCOSE 126*   < > 172*  --  122*  BUN 32*   < > 28*  --  28*  CREATININE 2.06*   < > 1.80* 1.85* 1.76*  CALCIUM 8.5*  --   --   --  8.1*   < > = values in this interval not displayed.   Recent Labs    12/27/17 1136  PTH 37   Iron Studies:  Recent Labs    12/27/17 1136  IRON 15*  TIBC 314    Studies/Results: Dg Chest Port 1 View  Result Date: 12/28/2017 CLINICAL DATA:  Intubated EXAM: PORTABLE CHEST 1 VIEW COMPARISON:  12/24/2017, 12/21/2017 FINDINGS: Endotracheal tube tip is about 5.5 cm superior to the carina.  Esophageal tube tip is below the diaphragm but incompletely imaged. Right IJ Swan-Ganz catheter tip overlies the pulmonary outflow tract. Interval sternotomy. Placement mediastinal and left chest drainage catheters, tip of the left catheter overlies the left lung apex. No definitive pneumothorax seen. Small left effusion and basilar atelectasis. Cardiomegaly with aortic atherosclerosis. IMPRESSION: 1. Placement of support lines and tubes as above. No definitive pneumothorax is seen 2. Interval sternotomy changes. Small left pleural effusion and left basilar atelectasis 3. Mild cardiomegaly Electronically Signed   By: Donavan Foil M.D.   On: 12/28/2017 16:58    I have reviewed the patient's current medications.  Assessment/Plan: 1 CKD 3b.  Stable.  Mild acidemia , follow may need Na bicarb 2 Anemia postop , also Fe defic 3 HTN not an issue 4 HPTH ok 5 CAD post CABG  Seems doing well, per Dr. Roxy Horseman 6 Obesity P follow chem , iv Fe, post op care   LOS: 7 days   Jeneen Rinks Chantrell Apsey 12/29/2017,7:24 AM

## 2017-12-29 NOTE — Progress Notes (Signed)
      [address]       [place] [address]             [phone]     POD # 1 CABG  Up in chair  BP 140/60   Pulse 69   Temp 98.1 F (36.7 C)   Resp 15   Ht 5\' 10"  (1.778 m)   Wt 102.7 kg   SpO2 92%   BMI 32.49 kg/m    Intake/Output Summary (Last 24 hours) at 12/29/2017 1814 Last data filed at 12/29/2017 1800 Gross per 24 hour  Intake 3664.87 ml  Output 1885 ml  Net 1779.87 ml   Creatinine 1.8 - stable Hct= 23 stable  Continue current Rx  Carlita Whitcomb C. Roxan Hockey, MD [facility] [phone]

## 2017-12-29 NOTE — Anesthesia Postprocedure Evaluation (Signed)
Anesthesia Post Note  Patient: [name]  Procedure(s) Performed: CORONARY ARTERY BYPASS GRAFTING (CABG) USING LEFT INTERNAL MAMMARY ARTERY TO LAD  AND ENDOSCOPICALLY HARVESTED RIGHT SAPHENOUS VEIN TO DIAGONAL, CIRC AND PDA (N/A Chest) TRANSESOPHAGEAL ECHOCARDIOGRAM (TEE) (N/A )     Patient location during evaluation: SICU Anesthesia Type: General Level of consciousness: sedated Pain management: pain level controlled Vital Signs Assessment: post-procedure vital signs reviewed and stable Respiratory status: patient remains intubated per anesthesia plan Cardiovascular status: stable Postop Assessment: no apparent nausea or vomiting Anesthetic complications: no    Last Vitals:  Vitals:   12/29/17 1000 12/29/17 1100  BP: 117/65 120/64  Pulse: 83 81  Resp: (!) 22 20  Temp: 36.7 C 36.8 C  SpO2: 91% 92%    Last Pain:  Vitals:   12/29/17 1100  TempSrc: Core  PainSc:                  Tiajuana Amass

## 2017-12-29 NOTE — Discharge Summary (Addendum)
Physician Discharge Summary       [address]       [place] [address]             [phone]    Patient ID: [name] MRN: [id number] DOB/AGE: [birth year] 84 y.o.  Admit date: 12/22/2017 Discharge date: 01/03/2018  Admission Diagnoses: 1. Unstable angina 2. Coronary artery disease  Discharge Diagnoses:  1. S/P CABG x 4 2. Large pericardial effusion 3. Pericarditis 4. Atrial fibrillation 5. Anemia 6. History of hypertension 7. History of hyperlipidemia 8. Acute renal failure with acute tubular necrosis superimposed on stage 3 chronic kidney disease [name]       Consults: Nephrology  Procedure (s):   Referring Physician Case Authorizing Physician  Martinique, Peter M, MD (Primary)    Procedures   LEFT HEART CATH AND CORONARY ANGIOGRAPHY on 12/23/2017:  Conclusion     Mid RCA-1 lesion is 80% stenosed.  Mid RCA-2 lesion is 70% stenosed.  Prox RCA lesion is 60% stenosed.  Prox LAD lesion is 90% stenosed.  Ost 1st Mrg to 1st Mrg lesion is 90% stenosed.  Mid Cx lesion is 100% stenosed.   1. Severe 3 vessel obstructive CAD with diffuse coronary calcification    - 90% proximal LAD at bifurcation of moderate sized diagonal branch    - 100% mid LCx occlusion. Left to left collaterals to a large OM branch    - diffuse proximal to mid RCA disease up to 80%.    Coronary artery bypass grafting x4 with left internal mammary artery to left anterior descending coronary artery, sequential reverse saphenous vein graft to the diagonal, sequential reverse saphenous vein graft to the circumflex, reverse saphenous vein graft to the posterior descending coronary artery with right leg greater saphenous endo vein harvesting, drainage of pericardial effusion by Dr. Servando Snare on 01/01/2018.  History of Presenting Illness: This is an 84 year old Caucasian male with a past medical history of chronic kidney disease, hypertension, anemia, and BPH who was in his usual state  of health until yesterday. He states he was sitting in a chair and developed discomfort/pain lin his left shoulder. He used Aspercream because he thought it was positional/musculoskeletal. He then developed mid sternal and back pain, along with shortness of breath. He also said it was difficult to take in a deep breath. He denied nausea, vomiting, diaphoresis, or syncope. He initially presented to [facility] was normal. A D dimer was done along with a CT scan. He ruled out for a PE but had coronary calcifications that were worrisome for coronary artery disease. In addition, a nuclear stress test showed moderate stress induced perfusion defect in the inferolateral area. At the periphery, the perfusion defect was fixed and the EF was 56%. He was transferred to [facility] for further evaluation and management.  A cardiac catheterization was done by Dr. Martinique earlier today which revealed 3 vessel disease. A cardiothoracic consultation was obtained with Dr. Servando Snare for the consideration of coronary artery bypass grafting surgery. Currently, the patient is on a Heparin drip and his vital signs are stable. He denies chest pain, back pain, or shortness of breath. Dr. Servando Snare saw the patient, reviewed the studies and discussed with the patient and his wife the option of CABG to treat his 3 vessel CAD with unstable, rest preinfarct angina. He was tentative scheduled for Wednesday 08/14, depending on renal function. His creatinine went up to 2.02 on 08/13. A consultation was obtained with Dr. Jimmy Footman.  Patient with known CKD (stage 3-4). A renal US showed large right renal cysts and left renal atrophy. Creatinine remained around 2. Pre operative carotid duplex US showed no significant internal carotid artery stenosis bilaterally. Patient underwent a CABG x 4 on 12/28/2017.  Brief Hospital Course:  The patient was extubated the evening of surgery without difficulty. He remained afebrile and  hemodynamically stable. He was weaned off Dopamine drip on 08/16. He was also transitioned from Amiodarone drip to oral Amiodarone on 08/16. Gordy Councilman, a line, chest tubes, and foley were removed early in the post operative course. Lopressor was started and titrated accordingly. He has CKD (stage III). Nephrology has been following him. He was volume over loaded and diuresed per nephrology recommendations. He will need to follow up with nephrology after discharge. Creatinine was elevated post op up to 1.95. Creatine as day of discharge was 1.78 .  He had ABL anemia.  He was given 2 units of PRBCs on 08/17. Last H and H was 9.3 and 29. He was put on Trinsicon. He was weaned off the insulin drip.    The patient's glucose remained well controlled.The patient's HGA1C pre op was 5.8. The patient was felt surgically stable for transfer from the ICU to [facility] for further convalescence on 12/31/2017. He continues to progress with cardiac rehab. He initially required HFNC but was later weaned to room air.  He had mild leukocytosis on 08/19. He has no wound infection and urinalysis was negative. Could be related to pericarditis found at surgery. He has been tolerating a diet and has had a bowel movement. Epicardial pacing wires were removed on 01/02/2018. Chest tube sutures will be removed the day of discharge. As discussed with Dr. Servando Snare, the patient is felt surgically stable for discharge today.   Latest Vital Signs: Blood pressure (!) 147/73, pulse 68, temperature 98.5 F (36.9 C), temperature source Oral, resp. rate (!) 28, height 5\' 10"  (1.778 m), weight 97.8 kg, SpO2 95 %.  Physical Exam: Cardiovascular: RRR Pulmonary: Clear bilaterally Abdomen: Soft, non tender, bowel sounds present. Extremities: Trace bilateral lower extremity edema. Wounds: Clean and dry.  No erythema or signs of infection.  Discharge Condition: Stable and discharged to home.  Recent laboratory studies:  Lab Results  Component  Value Date   WBC 11.3 (H) 01/03/2018   HGB 9.3 (L) 01/03/2018   HCT 29.0 (L) 01/03/2018   MCV 92.7 01/03/2018   PLT 240 01/03/2018   Lab Results  Component Value Date   NA 137 01/03/2018   K 4.0 01/03/2018   CL 104 01/03/2018   CO2 27 01/03/2018   CREATININE 1.78 (H) 01/03/2018   GLUCOSE 93 01/03/2018    Diagnostic Studies: Dg Chest 2 View  Result Date: 01/01/2018 CLINICAL DATA:  Atelectasis EXAM: CHEST - 2 VIEW COMPARISON:  12/31/2017 FINDINGS: Postoperative changes in the mediastinum. Cardiac enlargement. Linear atelectasis or infiltration in the left lung base, similar to previous study. Developing small left pleural effusion. Changes may indicate pneumonia. Mild linear atelectasis in the right lung base. No pneumothorax. Mediastinal contours appear intact. Right central venous catheter with tip over the upper SVC region. Calcification of the aorta. Degenerative changes in the spine and shoulders. IMPRESSION: Cardiac enlargement. Linear atelectasis or infiltration in the left lung base with developing small left pleural effusion. This may indicate pneumonia. Electronically Signed   By: Lucienne Capers M.D.   On: 01/01/2018 06:40    US Renal  Result Date: 12/26/2017 CLINICAL DATA:  Renal failure  EXAM: RENAL / URINARY TRACT ULTRASOUND COMPLETE COMPARISON:  CT scan 12/21/2017 FINDINGS: Right Kidney: Length: 13.1 cm. No hydronephrosis. Adjacent large cysts noted towards the lower pole. Dominant cyst measures up to 7.8 cm. Imaging features similar to recent CT scan is stable to mildly progressed compared to CT scan of 04/11/2017. Left Kidney: Length: Cortical atrophy without hydronephrosis. Increased echogenicity. Moderately distended. Bladder: Appears normal for degree of bladder distention. IMPRESSION: No evidence for hydronephrosis. Large right renal cysts as noted at on previous studies. Left renal atrophy and increased echogenicity of the renal parenchyma. Electronically Signed   By: Misty Stanley M.D.   On: 12/26/2017 17:01    Discharge Instructions    Amb Referral to Cardiac Rehabilitation   Complete by:  As directed    Will send Cardiac Rehab Phase 2 referral to [facility].   Diagnosis:  CABG   CABG X ___:  4      Discharge Medications: Allergies as of 01/03/2018      Reactions   Ativan [lorazepam] Other (See Comments)   Hallucinations.  Disoriented.  "Mean"   Adhesive [tape] Other (See Comments)   Redness      Medication List    STOP taking these medications   irbesartan 150 MG tablet Commonly known as:  AVAPRO     TAKE these medications   acetaminophen 500 MG tablet Commonly known as:  TYLENOL Take 1,000 mg by mouth every 6 (six) hours as needed for pain.   allopurinol 300 MG tablet Commonly known as:  ZYLOPRIM Take 300 mg by mouth every morning.   amiodarone 200 MG tablet Commonly known as:  PACERONE Take 1 tablet (200 mg total) by mouth daily.   amLODipine 10 MG tablet Commonly known as:  NORVASC Take 10 mg by mouth every morning.   aspirin 325 MG EC tablet Take 1 tablet (325 mg total) by mouth daily.   atorvastatin 40 MG tablet Commonly known as:  LIPITOR Take 1 tablet (40 mg total) by mouth daily at 6 PM.   diphenhydramine-acetaminophen 25-500 MG Tabs tablet Commonly known as:  TYLENOL PM Take 2 tablets by mouth at bedtime as needed (for sleep).   metoprolol tartrate 25 MG tablet Commonly known as:  LOPRESSOR Take 1 tablet (25 mg total) by mouth 2 (two) times daily.   multivitamin with minerals Tabs tablet Take 1 tablet by mouth daily.   sertraline 50 MG tablet Commonly known as:  ZOLOFT Take 50 mg by mouth daily.   tamsulosin 0.4 MG Caps capsule Commonly known as:  FLOMAX Take 0.4 mg by mouth daily after supper.   temazepam 15 MG capsule Commonly known as:  RESTORIL Take 15 mg by mouth at bedtime as needed for sleep.   traMADol 50 MG tablet Commonly known as:  ULTRAM Take 1 tablet (50 mg total) by mouth every 12  (twelve) hours as needed for moderate pain.   vitamin B-12 500 MCG tablet Commonly known as:  CYANOCOBALAMIN Take 500 mcg by mouth daily.   vitamin C 500 MG tablet Commonly known as:  ASCORBIC ACID Take 500 mg by mouth daily.      The patient has been discharged on:   1.Beta Blocker:  Yes [  x ]                              No   [   ]  If No, reason:  2.Ace Inhibitor/ARB: Yes [   ]                                     No  [ x   ]                                     If No, reason:CKD  3.Statin:   Yes [ x  ]                  No  [   ]                  If No, reason:  4.Ecasa:  Yes  [ x  ]                  No   [   ]                  If No, reason:  Follow Up Appointments: Follow-up Information    Grace Isaac, MD. Go on 02/02/2018.   Specialty:  Cardiothoracic Surgery Why:  PA/LAT CXR to be taken (at [facility] which is in the same building as Dr. Everrett Coombe office) on 02/06/2018 at 2:00 pm;Appointment time is at 2:30 pm Contact information: [place] [address] [phone]        Revankar, Reita Cliche, MD. Go on 01/23/2018.   Specialty:  Cardiology Why:  Appointment time is at 11:20 am Contact information: [address] [place] [address] [phone]        Mauricia Area, MD. Call.   Specialty:  Nephrology Why:  for a follow up appointment regarding further evaluation of CKD Contact information: [place] [address] [phone]           Signed: Sharalyn Ink [facility] 01/03/2018, 9:32 AM

## 2017-12-29 NOTE — Progress Notes (Addendum)
TCTS DAILY ICU PROGRESS NOTE                   [address]            [place] [address]          [phone]   1 Day Post-Op Procedure(s) (LRB): CORONARY ARTERY BYPASS GRAFTING (CABG) USING LEFT INTERNAL MAMMARY ARTERY TO LAD  AND ENDOSCOPICALLY HARVESTED RIGHT SAPHENOUS VEIN TO DIAGONAL, CIRC AND PDA (N/A) TRANSESOPHAGEAL ECHOCARDIOGRAM (TEE) (N/A)  Total Length of Stay:  LOS: 7 days   Subjective: Patient awake, alert this am.  Objective: Vital signs in last 24 hours: Temp:  [96.3 F (35.7 C)-98.2 F (36.8 C)] 98.2 F (36.8 C) (08/15 1100) Pulse Rate:  [76-90] 81 (08/15 1100) Cardiac Rhythm: Normal sinus rhythm (08/15 0800) Resp:  [0-25] 20 (08/15 1100) BP: (88-142)/(48-74) 120/64 (08/15 1100) SpO2:  [86 %-100 %] 92 % (08/15 1100) Arterial Line BP: (91-136)/(43-70) 119/46 (08/15 1100) FiO2 (%):  [40 %-60 %] 40 % (08/14 2139) Weight:  [102.7 kg] 102.7 kg (08/15 0600)  Filed Weights   12/27/17 0550 12/28/17 0358 12/29/17 0600  Weight: 96.8 kg 98.5 kg 102.7 kg    Weight change: 4.179 kg   Hemodynamic parameters for last 24 hours: PAP: (19-43)/(9-30) 27/9 CO:  [4 L/min-7.6 L/min] 7.6 L/min CI:  [1.9 L/min/m2-3.5 L/min/m2] 3.5 L/min/m2  Intake/Output from previous day: 08/14 0701 - 08/15 0700 In: 7097.2 [I.V.:4946.8; Blood:550; IV Piggyback:1600.5] Out: 8676 [Urine:1620; Blood:1200; Chest Tube:340]  Intake/Output this shift: Total I/O In: 1183.8 [P.O.:840; I.V.:243.8; IV Piggyback:100] Out: 315 [Urine:255; Chest Tube:60]  Current Meds: Scheduled Meds: . acetaminophen  1,000 mg Oral Q6H   Or  . acetaminophen (TYLENOL) oral liquid 160 mg/5 mL  1,000 mg Per Tube Q6H  . allopurinol  300 mg Oral q morning - 10a  . aspirin EC  325 mg Oral Daily   Or  . aspirin  324 mg Per Tube Daily  . atorvastatin  40 mg Oral q1800  . bisacodyl  10 mg Oral Daily   Or  . bisacodyl  10 mg Rectal Daily  . chlorhexidine  15 mL Mouth Rinse BID  . Chlorhexidine  Gluconate Cloth  6 each Topical Daily  . docusate sodium  200 mg Oral Daily  . insulin regular  0-10 Units Intravenous TID WC  . mouth rinse  15 mL Mouth Rinse q12n4p  . metoprolol tartrate  12.5 mg Oral BID   Or  . metoprolol tartrate  12.5 mg Per Tube BID  . multivitamin with minerals  1 tablet Oral Daily  . [START ON 12/30/2017] pantoprazole  40 mg Oral Daily  . sertraline  50 mg Oral Daily  . sodium chloride flush  10-40 mL Intracatheter Q12H  . sodium chloride flush  3 mL Intravenous Q12H  . tamsulosin  0.4 mg Oral QPC supper  . vitamin B-12  500 mcg Oral Daily   Continuous Infusions: . sodium chloride 20 mL/hr at 12/29/17 1100  . sodium chloride    . sodium chloride 1 mL/hr at 12/28/17 1610  . albumin human 250 mL (12/28/17 2210)  . amiodarone 30 mg/hr (12/29/17 1100)  . cefUROXime (ZINACEF)  IV Stopped (12/29/17 0747)  . dexmedetomidine (PRECEDEX) IV infusion Stopped (12/28/17 1900)  . DOPamine 3 mcg/kg/min (12/29/17 1100)  . ferumoxytol 510 mg (12/29/17 0956)  . insulin (NOVOLIN-R) infusion 2.7 mL/hr at 12/29/17 1100  . lactated ringers    . lactated ringers 10 mL/hr at  12/28/17 1610  . lactated ringers 20 mL/hr at 12/29/17 1100  . nitroGLYCERIN Stopped (12/28/17 1610)  . phenylephrine (NEO-SYNEPHRINE) Adult infusion Stopped (12/29/17 0343)   PRN Meds:.sodium chloride, albumin human, lactated ringers, metoprolol tartrate, midazolam, morphine injection, ondansetron (ZOFRAN) IV, oxyCODONE, sodium chloride flush, sodium chloride flush, traMADol  General appearance: alert, cooperative and no distress Neurologic: intact Heart: RRR Lungs: Diminished at bases Abdomen: Soft, protuberant, non tender, bowel sounds present Extremities: SCD LLE;RLE with mild edema Wound: Aquacel intact;RLE wounds clean and dry  Lab Results: CBC: Recent Labs    12/28/17 2209 12/29/17 0343  WBC 15.0* 11.8*  HGB 8.0* 7.8*  HCT 24.7* 23.9*  PLT 122* 126*   BMET:  Recent Labs     12/28/17 0458  12/28/17 2159 12/28/17 2209 12/29/17 0343  NA 134*   < > 137  --  137  K 4.8   < > 4.5  --  4.6  CL 104   < > 106  --  107  CO2 21*  --   --   --  20*  GLUCOSE 126*   < > 172*  --  122*  BUN 32*   < > 28*  --  28*  CREATININE 2.06*   < > 1.80* 1.85* 1.76*  CALCIUM 8.5*  --   --   --  8.1*   < > = values in this interval not displayed.    CMET: Lab Results  Component Value Date   WBC 11.8 (H) 12/29/2017   HGB 7.8 (L) 12/29/2017   HCT 23.9 (L) 12/29/2017   PLT 126 (L) 12/29/2017   GLUCOSE 122 (H) 12/29/2017   CHOL 112 12/23/2017   TRIG 52 12/23/2017   HDL 37 (L) 12/23/2017   LDLCALC 65 12/23/2017   ALT 18 12/26/2017   AST 18 12/26/2017   NA 137 12/29/2017   K 4.6 12/29/2017   CL 107 12/29/2017   CREATININE 1.76 (H) 12/29/2017   BUN 28 (H) 12/29/2017   CO2 20 (L) 12/29/2017   INR 1.56 12/28/2017   HGBA1C 5.8 (H) 12/24/2017      PT/INR:  Recent Labs    12/28/17 1633  LABPROT 18.5*  INR 1.56   Radiology: Dg Chest Port 1 View  Result Date: 12/29/2017 CLINICAL DATA:  Status post CABG. Chest tube remains in place. EXAM: PORTABLE CHEST 1 VIEW COMPARISON:  Yesterday. FINDINGS: The endotracheal and nasogastric tubes have been removed. Mediastinal and left chest tubes remain in place without pneumothorax. The right jugular Swan-Ganz catheter tip is in the right main pulmonary artery. Mildly increased patchy opacity in the left lower lobe and interval minimal right basilar atelectasis. Possible small left pleural effusion. Stable enlarged cardiac silhouette and post CABG changes. Lower thoracic spine degenerative changes. IMPRESSION: 1. Increased left lower lobe atelectasis or pneumonia. 2. Interval mild right basilar atelectasis. 3. Possible small left pleural effusion. 4. No pneumothorax. Electronically Signed   By: Claudie Revering M.D.   On: 12/29/2017 09:16   Dg Chest Port 1 View  Result Date: 12/28/2017 CLINICAL DATA:  Intubated EXAM: PORTABLE CHEST 1 VIEW  COMPARISON:  12/24/2017, 12/21/2017 FINDINGS: Endotracheal tube tip is about 5.5 cm superior to the carina. Esophageal tube tip is below the diaphragm but incompletely imaged. Right IJ Swan-Ganz catheter tip overlies the pulmonary outflow tract. Interval sternotomy. Placement mediastinal and left chest drainage catheters, tip of the left catheter overlies the left lung apex. No definitive pneumothorax seen. Small left effusion and basilar atelectasis. Cardiomegaly with  aortic atherosclerosis. IMPRESSION: 1. Placement of support lines and tubes as above. No definitive pneumothorax is seen 2. Interval sternotomy changes. Small left pleural effusion and left basilar atelectasis 3. Mild cardiomegaly Electronically Signed   By: Donavan Foil M.D.   On: 12/28/2017 16:58     Assessment/Plan: S/P Procedure(s) (LRB): CORONARY ARTERY BYPASS GRAFTING (CABG) USING LEFT INTERNAL MAMMARY ARTERY TO LAD  AND ENDOSCOPICALLY HARVESTED RIGHT SAPHENOUS VEIN TO DIAGONAL, CIRC AND PDA (N/A) TRANSESOPHAGEAL ECHOCARDIOGRAM (TEE) (N/A)  1. CV-CO/CI 7.6/3.5. Previous a fib. SR in the 70's. On Amiodarone and Dopamine drips. Also, on Lopressor 12.5 mg bid. Will likely transition to oral Amiodarone in the am. 2. Pulmonary-Chest tubes with 340 cc of output since surgery. He is on 6 liters of HFNC. CXR this am shows no pneumothorax, bibasilar atelectasis. Will remove mediastinal tube. Encourage incentive spirometer. 3. CKD (stage III)-creatinine slightly decreased to 1.76 this am. Nephrology following. 4. ABL anemia-H and H slightly decreased to 7.8 and 23.9. Monitor need for transfusion. Already started on  IV iron. 5. Hyperparathyroidism 6. Mild thrombocytopenia-platelets stable at 126,000 7. CBGs 139/145/150. Transition off of Insulin drip. His pre op HGA1C is 5.8. He likely has pre diabetes. Will provide diet information and he will need to follow up with his medical doctor after discharge. 8. Please see progression  orders    Donielle Liston Alba PA-C 12/29/2017 11:50 AM   I have seen and examined Guy Begin and agree with the above assessment  and plan.  Grace Isaac MD [facility] [phone] Office [phone] 12/29/2017 1:16 PM

## 2017-12-29 NOTE — Discharge Instructions (Signed)
Coronary Artery Bypass Grafting, Care After °These instructions give you information on caring for yourself after your procedure. Your doctor may also give you more specific instructions. Call your doctor if you have any problems or questions after your procedure. °Follow these instructions at home: °· Only take medicine as told by your doctor. Take medicines exactly as told. Do not stop taking medicines or start any new medicines without talking to your doctor first. °· Take your pulse as told by your doctor. °· Do deep breathing as told by your doctor. Use your breathing device (incentive spirometer), if given, to practice deep breathing several times a day. Support your chest with a pillow or your arms when you take deep breaths or cough. °· Keep the area clean, dry, and protected where the surgery cuts (incisions) were made. Remove bandages (dressings) only as told by your doctor. If strips were applied to surgical area, do not take them off. They fall off on their own. °· Check the surgery area daily for puffiness (swelling), redness, or leaking fluid. °· If surgery cuts were made in your legs: °? Avoid crossing your legs. °? Avoid sitting for long periods of time. Change positions every 30 minutes. °? Raise your legs when you are sitting. Place them on pillows. °· Wear stockings that help keep blood clots from forming in your legs (compression stockings). °· Only take sponge baths until your doctor says it is okay to take showers. Pat the surgery area dry. Do not rub the surgery area with a washcloth or towel. Do not bathe, swim, or use a hot tub until your doctor says it is okay. °· Eat foods that are high in fiber. These include raw fruits and vegetables, whole grains, beans, and nuts. Choose lean meats. Avoid canned, processed, and fried foods. °· Drink enough fluids to keep your pee (urine) clear or pale yellow. °· Weigh yourself every day. °· Rest and limit activity as told by your doctor. You may be told  to: °? Stop any activity if you have chest pain, shortness of breath, changes in heartbeat, or dizziness. Get help right away if this happens. °? Move around often for short amounts of time or take short walks as told by your doctor. Gradually become more active. You may need help to strengthen your muscles and build endurance. °? Avoid lifting, pushing, or pulling anything heavier than 10 pounds (4.5 kg) for at least 6 weeks after surgery. °· Do not drive until your doctor says it is okay. °· Ask your doctor when you can go back to work. °· Ask your doctor when you can begin sexual activity again. °· Follow up with your doctor as told. °Contact a doctor if: °· You have puffiness, redness, more pain, or fluid draining from the incision site. °· You have a fever. °· You have puffiness in your ankles or legs. °· You have pain in your legs. °· You gain 2 or more pounds (0.9 kg) a day. °· You feel sick to your stomach (nauseous) or throw up (vomit). °· You have watery poop (diarrhea). °Get help right away if: °· You have chest pain that goes to your jaw or arms. °· You have shortness of breath. °· You have a fast or irregular heartbeat. °· You notice a "clicking" in your breastbone when you move. °· You have numbness or weakness in your arms or legs. °· You feel dizzy or light-headed. °This information is not intended to replace advice given to you by   your health care provider. Make sure you discuss any questions you have with your health care provider. °Document Released: 05/08/2013 Document Revised: 10/09/2015 Document Reviewed: 10/10/2012 °Elsevier Interactive Patient Education © 2017 [employer] ° °

## 2017-12-29 NOTE — Care Management Note (Signed)
Case Management Note Marvetta Gibbons RN,BSN Unit 2H 1-22 - RN Care Coordinator (Case Management) [phone]  Patient Details  Name: [name] MRN: [id number] Date of Birth: 1931/08/31  Subjective/Objective:  Pt presented to [facility] with chest pain, tx with  abnormal Myoview- cath reveled 3VD, pt now s/p CABG 8/14                 Action/Plan: PTA pt lived at home with wife, PCP-John Lysbeth Galas, CM to follow for transition of care needs  Expected Discharge Date:                  Expected Discharge Plan:     In-House Referral:     Discharge planning Services  CM Consult  Post Acute Care Choice:    Choice offered to:     DME Arranged:    DME Agency:     HH Arranged:    [place] Agency:     Status of Service:  In process, will continue to follow  If discussed at Long Length of Stay Meetings, dates discussed:    Discharge Disposition:   Additional Comments:  Dawayne Patricia, RN 12/29/2017, 11:36 AM

## 2017-12-29 NOTE — Plan of Care (Signed)
  Problem: Clinical Measurements: Goal: Respiratory complications will improve Outcome: Not Progressing Note:  Patient required increase in HFNC oxygen rate.  When patient ambulated, patient oxygen sats dropped in to the 50's.  Patient became lightheaded and dizzy but kept telling RN he was fine.  RN  had patient sit down and wheeled patient back to room.    Problem: Activity: Goal: Risk for activity intolerance will decrease Outcome: Not Progressing Note:  Patient only ambulated approximately 20 feet.  Patient tried to walk at a fast pace and became unsteady on his feet and his oxygen sats dropped in to the 50's.  Patient became lightheaded and dizzy but kept telling RN he was fine.  RN  had patient sit down and wheeled patient back to room.  RN discussed with patient that on the next walk patient needed to walk very slowly focusing on taking breaths in through his nose and out through his mouth, patient verbalized understanding but will need reinforcement.

## 2017-12-30 ENCOUNTER — Inpatient Hospital Stay (HOSPITAL_COMMUNITY): Payer: Medicare Other

## 2017-12-30 LAB — RENAL FUNCTION PANEL
ALBUMIN: 2.8 g/dL — AB (ref 3.5–5.0)
ANION GAP: 6 (ref 5–15)
BUN: 26 mg/dL — ABNORMAL HIGH (ref 8–23)
CALCIUM: 8 mg/dL — AB (ref 8.9–10.3)
CO2: 22 mmol/L (ref 22–32)
Chloride: 105 mmol/L (ref 98–111)
Creatinine, Ser: 1.81 mg/dL — ABNORMAL HIGH (ref 0.61–1.24)
GFR calc Af Amer: 38 mL/min — ABNORMAL LOW (ref >60)
GFR, EST NON AFRICAN AMERICAN: 33 mL/min — AB (ref >60)
GLUCOSE: 119 mg/dL — AB (ref 70–99)
PHOSPHORUS: 4.2 mg/dL (ref 2.5–4.6)
Potassium: 4.4 mmol/L (ref 3.5–5.1)
SODIUM: 133 mmol/L — AB (ref 135–145)

## 2017-12-30 LAB — CBC
HCT: 23.7 % — ABNORMAL LOW (ref 39.0–52.0)
Hemoglobin: 7.6 g/dL — ABNORMAL LOW (ref 13.0–17.0)
MCH: 30.3 pg (ref 26.0–34.0)
MCHC: 32.1 g/dL (ref 30.0–36.0)
MCV: 94.4 fL (ref 78.0–100.0)
Platelets: 140 10*3/uL — ABNORMAL LOW (ref 150–400)
RBC: 2.51 MIL/uL — ABNORMAL LOW (ref 4.22–5.81)
RDW: 14.5 % (ref 11.5–15.5)
WBC: 12.1 10*3/uL — AB (ref 4.0–10.5)

## 2017-12-30 MED ORDER — SORBITOL 70 % PO SOLN
30.0000 mL | Freq: Once | ORAL | Status: DC
  Filled 2017-12-30 (×2): qty 30

## 2017-12-30 MED ORDER — FOLIC ACID 1 MG PO TABS
1.0000 mg | ORAL_TABLET | Freq: Every day | ORAL | Status: DC
  Administered 2017-12-30 – 2018-01-01 (×3): 1 mg via ORAL
  Filled 2017-12-30 (×3): qty 1

## 2017-12-30 MED ORDER — SORBITOL 70 % SOLN
30.0000 mL | Freq: Once | Status: AC
  Administered 2017-12-30: 30 mL via ORAL
  Filled 2017-12-30: qty 30

## 2017-12-30 MED ORDER — INSULIN DETEMIR 100 UNIT/ML ~~LOC~~ SOLN
6.0000 [IU] | Freq: Every day | SUBCUTANEOUS | Status: DC
  Administered 2017-12-30: 6 [IU] via SUBCUTANEOUS
  Filled 2017-12-30 (×3): qty 0.06

## 2017-12-30 MED ORDER — AMIODARONE HCL 200 MG PO TABS
200.0000 mg | ORAL_TABLET | Freq: Two times a day (BID) | ORAL | Status: DC
  Administered 2017-12-30 – 2018-01-03 (×9): 200 mg via ORAL
  Filled 2017-12-30 (×9): qty 1

## 2017-12-30 MED FILL — Heparin Sodium (Porcine) Inj 1000 Unit/ML: INTRAMUSCULAR | Qty: 30 | Status: AC

## 2017-12-30 MED FILL — Sodium Bicarbonate IV Soln 8.4%: INTRAVENOUS | Qty: 100 | Status: AC

## 2017-12-30 MED FILL — Sodium Chloride IV Soln 0.9%: INTRAVENOUS | Qty: 2000 | Status: AC

## 2017-12-30 MED FILL — Lidocaine HCl(Cardiac) IV PF Soln Pref Syr 100 MG/5ML (2%): INTRAVENOUS | Qty: 5 | Status: AC

## 2017-12-30 MED FILL — Mannitol IV Soln 20%: INTRAVENOUS | Qty: 500 | Status: AC

## 2017-12-30 MED FILL — Electrolyte-R (PH 7.4) Solution: INTRAVENOUS | Qty: 4000 | Status: AC

## 2017-12-30 NOTE — Progress Notes (Signed)
Patient was instructed on the flutter valve but declined to demonstrate at this time. The patient stated that he just took some medication.

## 2017-12-30 NOTE — Progress Notes (Signed)
Subjective: Interval History: has complaints constip.  Objective: Vital signs in last 24 hours: Temp:  [97.6 F (36.4 C)-98.2 F (36.8 C)] 97.6 F (36.4 C) (08/16 0005) Pulse Rate:  [59-100] 59 (08/16 0700) Resp:  [14-29] 16 (08/16 0700) BP: (97-140)/(51-99) 114/61 (08/16 0700) SpO2:  [87 %-95 %] 95 % (08/16 0700) Arterial Line BP: (85-131)/(43-72) 109/43 (08/15 1600) Weight:  [105.5 kg] 105.5 kg (08/16 0500) Weight change: 2.8 kg  Intake/Output from previous day: 08/15 0701 - 08/16 0700 In: 2659.6 [P.O.:1320; I.V.:1139.8; IV Piggyback:199.9] Out: 1485 [Urine:1165; Chest Tube:320] Intake/Output this shift: No intake/output data recorded.  General appearance: alert, cooperative, no distress, moderately obese and pale Resp: rales bibasilar and rhonchi bibasilar Cardio: S1, S2 normal and systolic murmur: systolic ejection 2/6, decrescendo at 2nd left intercostal space GI: obese, pos bs, liver down 5 cm, soft. Extremities: edema 2+  Lab Results: Recent Labs    12/29/17 1634 12/29/17 1641 12/30/17 0500  WBC 12.7*  --  12.1*  HGB 7.5* 7.8* 7.6*  HCT 23.0* 23.0* 23.7*  PLT 130*  --  140*   BMET:  Recent Labs    12/29/17 0343  12/29/17 1641 12/30/17 0500  NA 137  --  136 133*  K 4.6  --  4.7 4.4  CL 107  --  104 105  CO2 20*  --   --  22  GLUCOSE 122*  --  119* 119*  BUN 28*  --  26* 26*  CREATININE 1.76*   < > 1.80* 1.81*  CALCIUM 8.1*  --   --  8.0*   < > = values in this interval not displayed.   Recent Labs    12/27/17 1136  PTH 37   Iron Studies:  Recent Labs    12/27/17 1136  IRON 15*  TIBC 314    Studies/Results: Dg Chest Port 1 View  Result Date: 12/29/2017 CLINICAL DATA:  Status post CABG. Chest tube remains in place. EXAM: PORTABLE CHEST 1 VIEW COMPARISON:  Yesterday. FINDINGS: The endotracheal and nasogastric tubes have been removed. Mediastinal and left chest tubes remain in place without pneumothorax. The right jugular Swan-Ganz catheter  tip is in the right main pulmonary artery. Mildly increased patchy opacity in the left lower lobe and interval minimal right basilar atelectasis. Possible small left pleural effusion. Stable enlarged cardiac silhouette and post CABG changes. Lower thoracic spine degenerative changes. IMPRESSION: 1. Increased left lower lobe atelectasis or pneumonia. 2. Interval mild right basilar atelectasis. 3. Possible small left pleural effusion. 4. No pneumothorax. Electronically Signed   By: Claudie Revering M.D.   On: 12/29/2017 09:16   Dg Chest Port 1 View  Result Date: 12/28/2017 CLINICAL DATA:  Intubated EXAM: PORTABLE CHEST 1 VIEW COMPARISON:  12/24/2017, 12/21/2017 FINDINGS: Endotracheal tube tip is about 5.5 cm superior to the carina. Esophageal tube tip is below the diaphragm but incompletely imaged. Right IJ Swan-Ganz catheter tip overlies the pulmonary outflow tract. Interval sternotomy. Placement mediastinal and left chest drainage catheters, tip of the left catheter overlies the left lung apex. No definitive pneumothorax seen. Small left effusion and basilar atelectasis. Cardiomegaly with aortic atherosclerosis. IMPRESSION: 1. Placement of support lines and tubes as above. No definitive pneumothorax is seen 2. Interval sternotomy changes. Small left pleural effusion and left basilar atelectasis 3. Mild cardiomegaly Electronically Signed   By: Donavan Foil M.D.   On: 12/28/2017 16:58    I have reviewed the patient's current medications.  Assessment/Plan: 1 CKD 3 Cr stable, vol xs.  Slow dliuresis, limit vol 2 Anemia will give one dose esa, as has chronic issue 3 CAD Per [facility] 4 Obesity P allow to diurese, f/u outpatient ,   LOS: 8 days   Jeneen Rinks Cedra Villalon 12/30/2017,7:21 AM

## 2017-12-30 NOTE — Plan of Care (Signed)
  Problem: Activity: Goal: Risk for activity intolerance will decrease Outcome: Progressing   Problem: Cardiac: Goal: Will achieve and/or maintain hemodynamic stability Outcome: Progressing   Problem: Respiratory: Goal: Respiratory status will improve Outcome: Progressing   Problem: Skin Integrity: Goal: Risk for impaired skin integrity will decrease Outcome: Progressing   Problem: Urinary Elimination: Goal: Ability to achieve and maintain adequate renal perfusion and functioning will improve Outcome: Progressing

## 2017-12-30 NOTE — Progress Notes (Addendum)
[facility] DAILY ICU PROGRESS NOTE                   [address]            [place] [address]          [phone]   2 Days Post-Op Procedure(s) (LRB): CORONARY ARTERY BYPASS GRAFTING (CABG) USING LEFT INTERNAL MAMMARY ARTERY TO LAD  AND ENDOSCOPICALLY HARVESTED RIGHT SAPHENOUS VEIN TO DIAGONAL, CIRC AND PDA (N/A) TRANSESOPHAGEAL ECHOCARDIOGRAM (TEE) (N/A)  Total Length of Stay:  LOS: 8 days   Subjective: Patient sitting in chair. He has complaints of constipation.  Objective: Vital signs in last 24 hours: Temp:  [97.5 F (36.4 C)-98.2 F (36.8 C)] 97.5 F (36.4 C) (08/16 0745) Pulse Rate:  [59-100] 59 (08/16 0700) Cardiac Rhythm: Normal sinus rhythm (08/16 0400) Resp:  [14-29] 16 (08/16 0700) BP: (97-140)/(51-99) 114/61 (08/16 0700) SpO2:  [87 %-95 %] 95 % (08/16 0700) Arterial Line BP: (85-131)/(43-72) 109/43 (08/15 1600) Weight:  [105.5 kg] 105.5 kg (08/16 0500)  Filed Weights   12/28/17 0358 12/29/17 0600 12/30/17 0500  Weight: 98.5 kg 102.7 kg 105.5 kg    Weight change: 2.8 kg   Hemodynamic parameters for last 24 hours: PAP: (25-34)/(9-17) 25/10  Intake/Output from previous day: 08/15 0701 - 08/16 0700 In: 2659.6 [P.O.:1320; I.V.:1139.8; IV Piggyback:199.9] Out: 1485 [Urine:1165; Chest Tube:320]  Intake/Output this shift: No intake/output data recorded.  Current Meds: Scheduled Meds: . acetaminophen  1,000 mg Oral Q6H   Or  . acetaminophen (TYLENOL) oral liquid 160 mg/5 mL  1,000 mg Per Tube Q6H  . allopurinol  300 mg Oral q morning - 10a  . aspirin EC  325 mg Oral Daily   Or  . aspirin  324 mg Per Tube Daily  . atorvastatin  40 mg Oral q1800  . bisacodyl  10 mg Oral Daily   Or  . bisacodyl  10 mg Rectal Daily  . chlorhexidine  15 mL Mouth Rinse BID  . Chlorhexidine Gluconate Cloth  6 each Topical Daily  . docusate sodium  200 mg Oral Daily  . insulin detemir  8 Units Subcutaneous Daily  . mouth rinse  15 mL Mouth Rinse q12n4p  .  metoprolol tartrate  12.5 mg Oral BID   Or  . metoprolol tartrate  12.5 mg Per Tube BID  . multivitamin with minerals  1 tablet Oral Daily  . pantoprazole  40 mg Oral Daily  . sertraline  50 mg Oral Daily  . sodium chloride flush  10-40 mL Intracatheter Q12H  . sodium chloride flush  3 mL Intravenous Q12H  . tamsulosin  0.4 mg Oral QPC supper  . vitamin B-12  500 mcg Oral Daily   Continuous Infusions: . amiodarone 30 mg/hr (12/30/17 0700)  . cefUROXime (ZINACEF)  IV Stopped (12/29/17 2023)  . dexmedetomidine (PRECEDEX) IV infusion Stopped (12/28/17 1900)  . DOPamine 3 mcg/kg/min (12/30/17 0700)  . ferumoxytol 510 mg (12/29/17 0956)  . lactated ringers    . lactated ringers 10 mL/hr at 12/28/17 1610  . lactated ringers 20 mL/hr at 12/30/17 0700  . nitroGLYCERIN Stopped (12/28/17 1610)  . phenylephrine (NEO-SYNEPHRINE) Adult infusion Stopped (12/29/17 0343)   PRN Meds:.lactated ringers, metoprolol tartrate, morphine injection, ondansetron (ZOFRAN) IV, oxyCODONE, sodium chloride flush, sodium chloride flush, traMADol  General appearance: alert, cooperative and no distress Neurologic: intact Heart: RRR Lungs: Diminished at bases Abdomen: Soft, protuberant, non tender, bowel sounds present Extremities: Mild LE edemaz Wound: Aquacel  intact;RLE wounds clean and dry  Lab Results: CBC: Recent Labs    12/29/17 1634 12/29/17 1641 12/30/17 0500  WBC 12.7*  --  12.1*  HGB 7.5* 7.8* 7.6*  HCT 23.0* 23.0* 23.7*  PLT 130*  --  140*   BMET:  Recent Labs    12/29/17 0343  12/29/17 1641 12/30/17 0500  NA 137  --  136 133*  K 4.6  --  4.7 4.4  CL 107  --  104 105  CO2 20*  --   --  22  GLUCOSE 122*  --  119* 119*  BUN 28*  --  26* 26*  CREATININE 1.76*   < > 1.80* 1.81*  CALCIUM 8.1*  --   --  8.0*   < > = values in this interval not displayed.    CMET: Lab Results  Component Value Date   WBC 12.1 (H) 12/30/2017   HGB 7.6 (L) 12/30/2017   HCT 23.7 (L) 12/30/2017   PLT  140 (L) 12/30/2017   GLUCOSE 119 (H) 12/30/2017   CHOL 112 12/23/2017   TRIG 52 12/23/2017   HDL 37 (L) 12/23/2017   LDLCALC 65 12/23/2017   ALT 18 12/26/2017   AST 18 12/26/2017   NA 133 (L) 12/30/2017   K 4.4 12/30/2017   CL 105 12/30/2017   CREATININE 1.81 (H) 12/30/2017   BUN 26 (H) 12/30/2017   CO2 22 12/30/2017   INR 1.56 12/28/2017   HGBA1C 5.8 (H) 12/24/2017      PT/INR:  Recent Labs    12/28/17 1633  LABPROT 18.5*  INR 1.56   Radiology: No results found.   Assessment/Plan: S/P Procedure(s) (LRB): CORONARY ARTERY BYPASS GRAFTING (CABG) USING LEFT INTERNAL MAMMARY ARTERY TO LAD  AND ENDOSCOPICALLY HARVESTED RIGHT SAPHENOUS VEIN TO DIAGONAL, CIRC AND PDA (N/A) TRANSESOPHAGEAL ECHOCARDIOGRAM (TEE) (N/A)  1. CV- Previous a fib. SR in the 60's. On Amiodarone and Dopamine drips. Also, on Lopressor 12.5 mg bid. Will  transition to oral Amiodarone 220 mg bid this am. Per Dr. Servando Snare, stop Dopamine drip. 2. Pulmonary-Chest tube with 240 cc of output since surgery. He is on 8 liters of HFNC. CXR this am shows no pneumothorax, bibasilar atelectasis. Will remove remaining tube. Encourage incentive spirometer. 3. CKD (stage III)-creatinine slightly increased to 1.81 this am. Nephrology following. 4. ABL anemia-H and H slightly decreased to 7.6 and 23.7. Monitor need for transfusion. Already started on  IV iron. 5. Hyperparathyroidism 6. Mild thrombocytopenia-platelets increased to 140,000 7. CBGs 130/120/107. Transition off of Insulin drip. His pre op HGA1C is 5.8. He likely has pre diabetes. Will provide diet information and he will need to follow up with his medical doctor after discharge. 8. Sorbitol of constipation 9. Remove foley-bladder scan Q 8  Donielle Liston Alba PA-C 12/30/2017 8:02 AM

## 2017-12-31 ENCOUNTER — Inpatient Hospital Stay (HOSPITAL_COMMUNITY): Payer: Medicare Other

## 2017-12-31 LAB — BPAM RBC
Blood Product Expiration Date: 201909072359
Blood Product Expiration Date: 201909072359
Blood Product Expiration Date: 201909072359
Blood Product Expiration Date: 201909082359
ISSUE DATE / TIME: 201908060928
ISSUE DATE / TIME: 201908060928
ISSUE DATE / TIME: 201908140908
ISSUE DATE / TIME: 201908140908
Unit Type and Rh: 1700
Unit Type and Rh: 1700
Unit Type and Rh: 1700
Unit Type and Rh: 1700

## 2017-12-31 LAB — TYPE AND SCREEN
ABO/RH(D): B NEG
Antibody Screen: NEGATIVE
Unit division: 0
Unit division: 0
Unit division: 0
Unit division: 0

## 2017-12-31 LAB — RENAL FUNCTION PANEL
Albumin: 2.5 g/dL — ABNORMAL LOW (ref 3.5–5.0)
Anion gap: 8 (ref 5–15)
BUN: 32 mg/dL — AB (ref 8–23)
CALCIUM: 8 mg/dL — AB (ref 8.9–10.3)
CHLORIDE: 103 mmol/L (ref 98–111)
CO2: 22 mmol/L (ref 22–32)
CREATININE: 1.85 mg/dL — AB (ref 0.61–1.24)
GFR calc non Af Amer: 32 mL/min — ABNORMAL LOW (ref >60)
GFR, EST AFRICAN AMERICAN: 37 mL/min — AB (ref >60)
Glucose, Bld: 96 mg/dL (ref 70–99)
Phosphorus: 4 mg/dL (ref 2.5–4.6)
Potassium: 4.2 mmol/L (ref 3.5–5.1)
SODIUM: 133 mmol/L — AB (ref 135–145)

## 2017-12-31 LAB — CBC
HCT: 22 % — ABNORMAL LOW (ref 39.0–52.0)
Hemoglobin: 6.9 g/dL — CL (ref 13.0–17.0)
MCH: 29.9 pg (ref 26.0–34.0)
MCHC: 31.4 g/dL (ref 30.0–36.0)
MCV: 95.2 fL (ref 78.0–100.0)
Platelets: 147 10*3/uL — ABNORMAL LOW (ref 150–400)
RBC: 2.31 MIL/uL — ABNORMAL LOW (ref 4.22–5.81)
RDW: 14.8 % (ref 11.5–15.5)
WBC: 10.9 10*3/uL — ABNORMAL HIGH (ref 4.0–10.5)

## 2017-12-31 LAB — PREPARE RBC (CROSSMATCH)

## 2017-12-31 MED ORDER — SODIUM CHLORIDE 0.9% IV SOLUTION
Freq: Once | INTRAVENOUS | Status: AC
  Administered 2017-12-31: 13:00:00 via INTRAVENOUS

## 2017-12-31 MED ORDER — SODIUM CHLORIDE 0.9 % IV SOLN: 250.0000 mL | INTRAVENOUS | Status: DC | PRN

## 2017-12-31 MED ORDER — SODIUM CHLORIDE 0.9% FLUSH: 3.0000 mL | INTRAVENOUS | Status: DC | PRN

## 2017-12-31 MED ORDER — FUROSEMIDE 10 MG/ML IJ SOLN
40.0000 mg | Freq: Once | INTRAMUSCULAR | Status: AC
  Administered 2017-12-31: 40 mg via INTRAVENOUS
  Filled 2017-12-31: qty 4

## 2017-12-31 MED ORDER — MOVING RIGHT ALONG BOOK
Freq: Once | Status: AC
  Administered 2017-12-31: 11:00:00
  Filled 2017-12-31: qty 1

## 2017-12-31 MED ORDER — SODIUM CHLORIDE 0.9% FLUSH
3.0000 mL | Freq: Two times a day (BID) | INTRAVENOUS | Status: DC
  Administered 2017-12-31 – 2018-01-03 (×5): 3 mL via INTRAVENOUS

## 2017-12-31 NOTE — Progress Notes (Signed)
[address]       [place] [address]             [phone]        CARDIOTHORACIC SURGERY PROGRESS NOTE   R3 Days Post-Op Procedure(s) (LRB): CORONARY ARTERY BYPASS GRAFTING (CABG) USING LEFT INTERNAL MAMMARY ARTERY TO LAD  AND ENDOSCOPICALLY HARVESTED RIGHT SAPHENOUS VEIN TO DIAGONAL, CIRC AND PDA (N/A) TRANSESOPHAGEAL ECHOCARDIOGRAM (TEE) (N/A)  Subjective: Grumpy.  Minimal pain.  Wants blankets and to get in bed.  Reportedly very dyspneic with any activity.  Very weak  Objective: Vital signs: BP Readings from Last 1 Encounters:  12/31/17 (!) 146/81   Pulse Readings from Last 1 Encounters:  12/31/17 78   Resp Readings from Last 1 Encounters:  12/31/17 (!) 24   Temp Readings from Last 1 Encounters:  12/31/17 97.9 F (36.6 C) (Oral)    Hemodynamics:    Physical Exam:  Rhythm:   sinus  Breath sounds: clear  Heart sounds:  RRR  Incisions:  Dressing dry, intact  Abdomen:  Soft, non-distended, non-tender  Extremities:  Warm, well-perfused    Intake/Output from previous day: 08/16 0701 - 08/17 0700 In: 1430.2 [P.O.:840; I.V.:490.2; IV Piggyback:100] Out: 925 [Urine:925] Intake/Output this shift: Total I/O In: 260.1 [P.O.:240; I.V.:20.1] Out: 450 [Urine:450]  Lab Results:  CBC: Recent Labs    12/30/17 0500 12/31/17 0519  WBC 12.1* 10.9*  HGB 7.6* 6.9*  HCT 23.7* 22.0*  PLT 140* 147*    BMET:  Recent Labs    12/30/17 0500 12/31/17 0519  NA 133* 133*  K 4.4 4.2  CL 105 103  CO2 22 22  GLUCOSE 119* 96  BUN 26* 32*  CREATININE 1.81* 1.85*  CALCIUM 8.0* 8.0*     PT/INR:   Recent Labs    12/28/17 1633  LABPROT 18.5*  INR 1.56    CBG (last 3)  Recent Labs    12/29/17 1316 12/29/17 1420 12/29/17 1528  GLUCAP 130* 120* 107*    ABG    Component Value Date/Time   PHART 7.336 (L) 12/28/2017 2313   PCO2ART 33.6 12/28/2017 2313   PO2ART 74.0 (L) 12/28/2017 2313   HCO3 18.0 (L) 12/28/2017 2313   TCO2 20 (L)  12/29/2017 1641   ACIDBASEDEF 7.0 (H) 12/28/2017 2313   O2SAT 94.0 12/28/2017 2313    CXR: PORTABLE CHEST 1 VIEW  COMPARISON:  12/30/2016  FINDINGS: Sternotomy wires unchanged. Right IJ central venous sheath unchanged with tip over the SVC. Lungs are adequately inflated with minimal left retrocardiac opacification likely atelectasis. No definite effusion. Stable cardiomegaly. Remainder the exam is unchanged.  IMPRESSION: Minimal left retrocardiac opacification likely atelectasis.  Stable cardiomegaly.  Right IJ central venous sheath unchanged.   Electronically Signed   By: Marin Olp M.D.   On: 12/31/2017 07:53   Assessment/Plan: S/P Procedure(s) (LRB): CORONARY ARTERY BYPASS GRAFTING (CABG) USING LEFT INTERNAL MAMMARY ARTERY TO LAD  AND ENDOSCOPICALLY HARVESTED RIGHT SAPHENOUS VEIN TO DIAGONAL, CIRC AND PDA (N/A) TRANSESOPHAGEAL ECHOCARDIOGRAM (TEE) (N/A)  Overall stable POD3 Maintaining NSR w/ stable BP Marginal oxygenation w/ decreased O2 sats w/ activity CXR looks good w/ only mild atelectasis Expected post op acute blood loss anemia, Hgb down further 6.9 Expected post op volume excess, weight reportedly 9 lbs > preop, UOP adequate CKD stage III - creatinine stable and close to preop   Transfuse 2 units PRBCs  Diuresis  Mobilize  Pulm toilet  PT Generalize deconditioning    Braulio Conte  Keturah Barre, MD 12/31/2017 10:12 AM

## 2017-12-31 NOTE — Progress Notes (Signed)
Pt ambulated in hallway with minimal assistance after setup. Initially started on 6 liters [place] pt desatted to mid 60s and oxygen increased to 10 liters for the remainder of ambulation with sats improved to 87-90%. Pt breathing primarily through his mouth and promotes some mild DOE. Pt repositioned to chair once returned to room and sats improve to 96-100% when back at rest on 5liters HFNC. Will continue to monitor.

## 2017-12-31 NOTE — Progress Notes (Signed)
[facility] BRIEF SICU PROGRESS NOTE  3 Days Post-Op  S/P Procedure(s) (LRB): CORONARY ARTERY BYPASS GRAFTING (CABG) USING LEFT INTERNAL MAMMARY ARTERY TO LAD  AND ENDOSCOPICALLY HARVESTED RIGHT SAPHENOUS VEIN TO DIAGONAL, CIRC AND PDA (N/A) TRANSESOPHAGEAL ECHOCARDIOGRAM (TEE) (N/A)   Stable day Diuresing well after transfusion  Plan: Continue current plan  Rexene Alberts, MD 12/31/2017 5:47 PM

## 2017-12-31 NOTE — Plan of Care (Signed)
  Problem: Safety: Goal: Ability to remain free from injury will improve Outcome: Progressing   Problem: Education: Goal: Will demonstrate proper wound care and an understanding of methods to prevent future damage Outcome: Progressing Goal: Knowledge of disease or condition will improve Outcome: Progressing Goal: Knowledge of the prescribed therapeutic regimen will improve Outcome: Progressing   Problem: Activity: Goal: Risk for activity intolerance will decrease Outcome: Progressing

## 2018-01-01 ENCOUNTER — Inpatient Hospital Stay (HOSPITAL_COMMUNITY): Payer: Medicare Other

## 2018-01-01 LAB — RENAL FUNCTION PANEL
ALBUMIN: 2.5 g/dL — AB (ref 3.5–5.0)
ANION GAP: 4 — AB (ref 5–15)
BUN: 33 mg/dL — ABNORMAL HIGH (ref 8–23)
CO2: 24 mmol/L (ref 22–32)
Calcium: 7.8 mg/dL — ABNORMAL LOW (ref 8.9–10.3)
Chloride: 108 mmol/L (ref 98–111)
Creatinine, Ser: 1.95 mg/dL — ABNORMAL HIGH (ref 0.61–1.24)
GFR, EST AFRICAN AMERICAN: 35 mL/min — AB (ref >60)
GFR, EST NON AFRICAN AMERICAN: 30 mL/min — AB (ref >60)
Glucose, Bld: 93 mg/dL (ref 70–99)
PHOSPHORUS: 4 mg/dL (ref 2.5–4.6)
Potassium: 4.3 mmol/L (ref 3.5–5.1)
Sodium: 136 mmol/L (ref 135–145)

## 2018-01-01 LAB — CBC
HEMATOCRIT: 27 % — AB (ref 39.0–52.0)
HEMOGLOBIN: 8.9 g/dL — AB (ref 13.0–17.0)
MCH: 30.4 pg (ref 26.0–34.0)
MCHC: 33 g/dL (ref 30.0–36.0)
MCV: 92.2 fL (ref 78.0–100.0)
Platelets: 174 10*3/uL (ref 150–400)
RBC: 2.93 MIL/uL — AB (ref 4.22–5.81)
RDW: 15.5 % (ref 11.5–15.5)
WBC: 11.5 10*3/uL — AB (ref 4.0–10.5)

## 2018-01-01 LAB — TYPE AND SCREEN
ABO/RH(D): B NEG
Antibody Screen: NEGATIVE
Unit division: 0
Unit division: 0

## 2018-01-01 LAB — BPAM RBC
BLOOD PRODUCT EXPIRATION DATE: 201909032359
BLOOD PRODUCT EXPIRATION DATE: 201909032359
ISSUE DATE / TIME: 201908171255
ISSUE DATE / TIME: 201908171442
UNIT TYPE AND RH: 1700
Unit Type and Rh: 1700

## 2018-01-01 MED ORDER — MOVING RIGHT ALONG BOOK
Freq: Once | Status: AC
  Administered 2018-01-01: 12:00:00
  Filled 2018-01-01: qty 1

## 2018-01-01 MED ORDER — FE FUMARATE-B12-VIT C-FA-IFC PO CAPS
1.0000 | ORAL_CAPSULE | Freq: Three times a day (TID) | ORAL | Status: DC
  Administered 2018-01-01 – 2018-01-03 (×7): 1 via ORAL
  Filled 2018-01-01 (×7): qty 1

## 2018-01-01 MED ORDER — METOPROLOL TARTRATE 25 MG PO TABS
25.0000 mg | ORAL_TABLET | Freq: Two times a day (BID) | ORAL | Status: DC
  Administered 2018-01-01 – 2018-01-03 (×4): 25 mg via ORAL
  Filled 2018-01-01 (×4): qty 1

## 2018-01-01 MED ORDER — FUROSEMIDE 10 MG/ML IJ SOLN
40.0000 mg | Freq: Two times a day (BID) | INTRAMUSCULAR | Status: AC
  Administered 2018-01-01 – 2018-01-02 (×2): 40 mg via INTRAVENOUS
  Filled 2018-01-01 (×2): qty 4

## 2018-01-01 NOTE — Op Note (Signed)
[name], [name] MEDICAL RECORD [id number] ACCOUNT [id number] DATE OF BIRTH:[birth year] FACILITY: [facility] LOCATION: [place], [place]  OPERATIVE REPORT  DATE OF PROCEDURE:  12/28/2017  PREOPERATIVE DIAGNOSES:  Recent non-ST elevation myocardial infarction, 3-vessel coronary artery disease.  POSTOPERATIVE DIAGNOSES:  In addition to coronary artery disease, severe diffuse pericarditis with large pericardial effusion and near tamponade.  PROCEDURE PERFORMED:  Coronary artery bypass grafting x4 with left internal mammary artery to left anterior descending coronary artery, sequential reverse saphenous vein graft to the diagonal, sequential reverse saphenous vein graft to the circumflex,  reverse saphenous vein graft to the posterior descending coronary artery with right leg greater saphenous endo vein harvesting, drainage of pericardial effusion.  SURGEON:  Lanelle Bal, MD  FIRST ASSISTANT:  Lars Pinks, PA  BRIEF HISTORY:  The patient is an 84 year old male who presents with new onset of chest pain and mildly elevated troponin consistent with non-STEMI myocardial infarction.  Preoperative echo showed preserved LV function.  It was reported as no pericardial  effusion.  The patient has known renal insufficiency with chronic creatinine of 1.7.  Because of the patient's left main obstruction and 3-vessel coronary artery disease, coronary artery bypass grafting was recommended.  The patient agreed and signed  informed consent.  DESCRIPTION OF PROCEDURE:  The Swan-Ganz and arterial line monitors were placed.  The patient underwent general endotracheal anesthesia.  Dr. Ola Spurr placed a TEE probe.  With examination it became apparent the patient had a very significant  pericardial effusion.  The skin of chest and legs was prepped with Betadine and draped in the usual sterile manner.  Appropriate timeout was performed.  We proceeded with endoscopic right greater  saphenous vein harvested from the upper calf.  Median  sternotomy was performed.  Left internal mammary artery was dissected down as a pedicle graft.  On opening the pericardium, it became obvious that the patient had severe diffuse pericarditis that was in the acute phase with moderate-sized pericardial  effusion under significant tension.  This was drained.  The patient was systemically heparinized.  The ascending aorta was cannulated.  The right atrium was cannulated.  An aortic root vent cardioplegia needle was introduced into the ascending aorta.   The patient was placed on cardiopulmonary bypass at 2.4 L/min per sq m.  Sites of anastomosis were selected and dissected out of the epicardium with some difficulty due to the acute pericarditis.  The patient's body temperature was cooled to 32 degrees.   Aortic crossclamp was applied, and 500 mL of cold blood potassium cardioplegia was administered with diastolic arrest of the heart.  Myocardial septal temperatures were monitored throughout the crossclamp.  Attention was turned first to the posterior  descending coronary artery, which was in the proximal third.  The vessel was opened and was a reasonable size vessel.  It admitted a 1.5 mm probe.  Using a running 7-0 Prolene, distal anastomosis was performed.  The heart was then elevated, and the  circumflex coronary artery was identified.  This vessel was opened with a 1.5 mm probe.  Using a running 7-0 Prolene, distal anastomosis was performed.  The diagonal coronary artery was 1.2 mm in size.  This vessel was opened.  It admitted a 1 mm probe.   Using a running 7-0 Prolene, distal anastomosis was performed.  Between the mid and distal LAD, the LAD was dissected out of the epicardium.  The LAD was a reasonable size vessel.  It admitted a 1.5 mm probe.  Using a  running 8-0 Prolene, the left  internal mammary artery was anastomosed to the left anterior descending coronary artery. Intermittently, cold blood  cardioplegia was administered down the vein grafts.  With crossclamp still in place, 3 punch aortotomies were performed, and each of the 3  vein grafts were anastomosed to this.  The bulldog was removed from the mammary artery with rise in myocardial septal temperature.  Aortic crossclamp was removed.  The total crossclamp time was 120 minutes.  The patient spontaneously converted to a  sinus rhythm.  Sites of anastomoses were inspected for any bleeding.  The patient was then ventilated and weaned from cardiopulmonary bypass without difficulty.  He remained hemodynamically stable.  He was decannulated in the usual fashion.  Protamine  sulfate was administered with operative field hemostatic.  Atrial and ventricular pacing wires were applied.  Graft markers were applied.  A left pleural tube and Blake mediastinal drain were left in place.  The pericardium was not closed due to the  patient's significant pericardial inflammation.  The sternum was closed with #6 stainless steel wire.  The fascia was closed with interrupted 0 Vicryl, running 3-0 Vicryl subcutaneous tissue, 4-0 subcuticular stitch in skin edges.  Dry dressings were  applied.  Sponge and needle count was reported as correct at completion of the procedure.  The patient tolerated the procedure without obvious complication and was transferred to the surgical intensive care unit for further postoperative care.  During  the procedure, a hemoconcentrator was used to take the volume off and maintained a reasonable urine output throughout the procedure.    The first assistant on the case was a 18 assistant who helped by providing exposure, conduit harvest, cannulation decannulation and wound closure.  LN/NUANCE  D:01/01/2018 T:01/01/2018 JOB:[id number]

## 2018-01-01 NOTE — Progress Notes (Signed)
Got report from [place], RN to transfer Pt to room 4E-26. Pt arrived at 12:06 pm, alert, oriented, vital sings stable. CCM verified EKG is SR with 1st degree AV Block on monitor. Will continue to monitor.  Kennyth Lose, RN

## 2018-01-01 NOTE — Progress Notes (Signed)
[address]       [place] [address]             [phone]        CARDIOTHORACIC SURGERY PROGRESS NOTE   R4 Days Post-Op Procedure(s) (LRB): CORONARY ARTERY BYPASS GRAFTING (CABG) USING LEFT INTERNAL MAMMARY ARTERY TO LAD  AND ENDOSCOPICALLY HARVESTED RIGHT SAPHENOUS VEIN TO DIAGONAL, CIRC AND PDA (N/A) TRANSESOPHAGEAL ECHOCARDIOGRAM (TEE) (N/A)  Subjective: Feels much better today. Ambulated w/ minimal assistance.  Mild soreness in chest.  Slept some.  Breathing much improved  Objective: Vital signs: BP Readings from Last 1 Encounters:  01/01/18 140/67   Pulse Readings from Last 1 Encounters:  01/01/18 73   Resp Readings from Last 1 Encounters:  01/01/18 19   Temp Readings from Last 1 Encounters:  01/01/18 98.1 F (36.7 C) (Oral)    Hemodynamics:    Physical Exam:  Rhythm:   sinus  Breath sounds: clear  Heart sounds:  RRR  Incisions:  Clean and dry  Abdomen:  Soft, non-distended, non-tender  Extremities:  Warm, well-perfused    Intake/Output from previous day: 08/17 0701 - 08/18 0700 In: 1648 [P.O.:900; I.V.:156; Blood:592] Out: 4500 [Urine:4500] Intake/Output this shift: Total I/O In: 240 [P.O.:240] Out: -   Lab Results:  CBC: Recent Labs    12/31/17 0519 01/01/18 0502  WBC 10.9* 11.5*  HGB 6.9* 8.9*  HCT 22.0* 27.0*  PLT 147* 174    BMET:  Recent Labs    12/31/17 0519 01/01/18 0502  NA 133* 136  K 4.2 4.3  CL 103 108  CO2 22 24  GLUCOSE 96 93  BUN 32* 33*  CREATININE 1.85* 1.95*  CALCIUM 8.0* 7.8*     PT/INR:  No results for input(s): LABPROT, INR in the last 72 hours.  CBG (last 3)  Recent Labs    12/29/17 1316 12/29/17 1420 12/29/17 1528  GLUCAP 130* 120* 107*    ABG    Component Value Date/Time   PHART 7.336 (L) 12/28/2017 2313   PCO2ART 33.6 12/28/2017 2313   PO2ART 74.0 (L) 12/28/2017 2313   HCO3 18.0 (L) 12/28/2017 2313   TCO2 20 (L) 12/29/2017 1641   ACIDBASEDEF 7.0 (H) 12/28/2017  2313   O2SAT 94.0 12/28/2017 2313    CXR: CHEST - 2 VIEW  COMPARISON:  12/31/2017  FINDINGS: Postoperative changes in the mediastinum. Cardiac enlargement. Linear atelectasis or infiltration in the left lung base, similar to previous study. Developing small left pleural effusion. Changes may indicate pneumonia. Mild linear atelectasis in the right lung base. No pneumothorax. Mediastinal contours appear intact. Right central venous catheter with tip over the upper SVC region. Calcification of the aorta. Degenerative changes in the spine and shoulders.  IMPRESSION: Cardiac enlargement. Linear atelectasis or infiltration in the left lung base with developing small left pleural effusion. This may indicate pneumonia.   Electronically Signed   By: Lucienne Capers M.D.   On: 01/01/2018 06:40  Assessment/Plan: S/P Procedure(s) (LRB): CORONARY ARTERY BYPASS GRAFTING (CABG) USING LEFT INTERNAL MAMMARY ARTERY TO LAD  AND ENDOSCOPICALLY HARVESTED RIGHT SAPHENOUS VEIN TO DIAGONAL, CIRC AND PDA (N/A) TRANSESOPHAGEAL ECHOCARDIOGRAM (TEE) (N/A)  Overall stable POD4 Maintaining NSR w/ stable BP Breathing comfortably w/ much less O2 requirement, now 98% on 2 L/min Expected post op acute blood loss anemia, improved after transfusion Post op elevated serum creatinine - acute exacerbation of baseline CKD, likely due to prerenal azotemia +/- acute kidney injury caused by ATN, creatinine up  slightly 1.9 but UOP much improved Expected post op volume excess, weight down 3 kg but reportedly still 7.5 kg > preop, I/O's negative 2.8 liters yesterday   Mobilize  Diuresis  Watch renal function  Transfer step down    Rexene Alberts, MD 01/01/2018 10:13 AM

## 2018-01-02 LAB — URINALYSIS, ROUTINE W REFLEX MICROSCOPIC
Bilirubin Urine: NEGATIVE
Glucose, UA: NEGATIVE mg/dL
HGB URINE DIPSTICK: NEGATIVE
KETONES UR: NEGATIVE mg/dL
LEUKOCYTES UA: NEGATIVE
Nitrite: NEGATIVE
PROTEIN: NEGATIVE mg/dL
Specific Gravity, Urine: 1.005 (ref 1.005–1.030)
pH: 5 (ref 5.0–8.0)

## 2018-01-02 LAB — RENAL FUNCTION PANEL
Albumin: 2.6 g/dL — ABNORMAL LOW (ref 3.5–5.0)
Anion gap: 7 (ref 5–15)
BUN: 34 mg/dL — AB (ref 8–23)
CHLORIDE: 105 mmol/L (ref 98–111)
CO2: 28 mmol/L (ref 22–32)
CREATININE: 1.83 mg/dL — AB (ref 0.61–1.24)
Calcium: 8.3 mg/dL — ABNORMAL LOW (ref 8.9–10.3)
GFR calc Af Amer: 37 mL/min — ABNORMAL LOW (ref >60)
GFR calc non Af Amer: 32 mL/min — ABNORMAL LOW (ref >60)
Glucose, Bld: 104 mg/dL — ABNORMAL HIGH (ref 70–99)
POTASSIUM: 4.1 mmol/L (ref 3.5–5.1)
Phosphorus: 3.8 mg/dL (ref 2.5–4.6)
Sodium: 140 mmol/L (ref 135–145)

## 2018-01-02 LAB — CBC
HEMATOCRIT: 27.7 % — AB (ref 39.0–52.0)
Hemoglobin: 9.1 g/dL — ABNORMAL LOW (ref 13.0–17.0)
MCH: 30 pg (ref 26.0–34.0)
MCHC: 32.9 g/dL (ref 30.0–36.0)
MCV: 91.4 fL (ref 78.0–100.0)
PLATELETS: 218 10*3/uL (ref 150–400)
RBC: 3.03 MIL/uL — ABNORMAL LOW (ref 4.22–5.81)
RDW: 15.5 % (ref 11.5–15.5)
WBC: 12.6 10*3/uL — AB (ref 4.0–10.5)

## 2018-01-02 MED ORDER — AMLODIPINE BESYLATE 10 MG PO TABS
10.0000 mg | ORAL_TABLET | Freq: Every day | ORAL | Status: DC
  Administered 2018-01-02 – 2018-01-03 (×2): 10 mg via ORAL
  Filled 2018-01-02 (×2): qty 1

## 2018-01-02 NOTE — Progress Notes (Addendum)
      [address]       [place], [address]             [phone]        5 Days Post-Op Procedure(s) (LRB): CORONARY ARTERY BYPASS GRAFTING (CABG) USING LEFT INTERNAL MAMMARY ARTERY TO LAD  AND ENDOSCOPICALLY HARVESTED RIGHT SAPHENOUS VEIN TO DIAGONAL, CIRC AND PDA (N/A) TRANSESOPHAGEAL ECHOCARDIOGRAM (TEE) (N/A)  Subjective: Patient not sleeping much. Had large bowel movement last evening.  Objective: Vital signs in last 24 hours: Temp:  [97.5 F (36.4 C)-97.8 F (36.6 C)] 97.8 F (36.6 C) (08/18 1622) Pulse Rate:  [64-74] 71 (08/18 1622) Cardiac Rhythm: Heart block (08/18 2035) Resp:  [14-20] 20 (08/18 1622) BP: (130-161)/(64-83) 161/80 (08/18 1622) SpO2:  [96 %-100 %] 98 % (08/18 1622) Weight:  [100.1 kg] (P) 100.1 kg (08/19 0500)  Pre op weight 98.5 kg Current Weight  01/02/18 (P) 100.1 kg      Intake/Output from previous day: 08/18 0701 - 08/19 0700 In: 1450 [P.O.:1450] Out: 3350 [Urine:3350]   Physical Exam:  Cardiovascular: RRR Pulmonary: Mostly clear bilaterally Abdomen: Soft, non tender, bowel sounds present. Extremities: Trace bilateral lower extremity edema. Wounds: Clean and dry.  No erythema or signs of infection.  Lab Results: CBC: Recent Labs    01/01/18 0502 01/02/18 0432  WBC 11.5* 12.6*  HGB 8.9* 9.1*  HCT 27.0* 27.7*  PLT 174 218   BMET:  Recent Labs    01/01/18 0502 01/02/18 0432  NA 136 140  K 4.3 4.1  CL 108 105  CO2 24 28  GLUCOSE 93 104*  BUN 33* 34*  CREATININE 1.95* 1.83*  CALCIUM 7.8* 8.3*    PT/INR:  Lab Results  Component Value Date   INR 1.56 12/28/2017   INR 1.17 12/23/2017   INR 1.09 08/24/2013   ABG:  INR: Will add last result for INR, ABG once components are confirmed Will add last 4 CBG results once components are confirmed  Assessment/Plan:  1. CV - S/p NSTEMI. SR, first degree heart block. More hypertensive. On Amiodarone 200 mg bid, Lopressor 25 mg bid. Will restart  Amlodipine for better BP control. No ACE secondary to elevated creatinine. 2.  Pulmonary - On room air this am. CXR yesterday showed left base infiltration vs atelectasis and small pleural effusion. Encourage incentive spirometer. 3. CKD (stahge III)=Creatinine decreased to 1.83 this am. 4.  Acute blood loss anemia - H and H stable at 9.1 and 27.7. Continue Trinsicon. 5. Leukocytosis-WBC slightly increased to 12,600. Remains afebrile. Could be related to pericarditis found intra op. Will check UA 6. Volume overload-on Lasix 40 mg IV bid 7. Remove EPW 8. Hopefully, home mid week  Donielle M ZimmermanPA-C 01/02/2018,7:30 AM [phone] Slowly progressing, cr stabilized at preop levels  I have seen and examined Guy Begin and agree with the above assessment  and plan.  Grace Isaac MD Beeper [phone] Office [phone] 01/02/2018 2:04 PM

## 2018-01-02 NOTE — Progress Notes (Signed)
EPW pulled per protocol and as ordered. All ends intact. Patient reminded to lie supine approximately one hour. bp 122/65 heart rate 66. Malayah Demuro, [name] RN

## 2018-01-02 NOTE — Progress Notes (Signed)
Informed by pts RN pt would like to attend [facility] [place]. Will send referral there.  Rufina Falco, RN BSN 01/02/2018 2:43 PM

## 2018-01-02 NOTE — Plan of Care (Signed)
  Problem: Health Behavior/Discharge Planning: Goal: Ability to manage health-related needs will improve Outcome: Progressing   Problem: Clinical Measurements: Goal: Will remain free from infection Outcome: Progressing Goal: Diagnostic test results will improve Outcome: Progressing   

## 2018-01-02 NOTE — Progress Notes (Signed)
Patient ambulated in hallway with nursing staff. [name], [name] RN

## 2018-01-02 NOTE — Progress Notes (Signed)
CARDIAC REHAB PHASE I   PRE:  Rate/Rhythm: 84 SR  BP:  Supine:   Sitting: 141/69  Standing:    SaO2: 98%RA  MODE:  Ambulation: 470 ft   POST:  Rate/Rhythm: 99 SR  BP:  Supine:   Sitting: 162/78  Standing:    SaO2: 99%RA [id number] Pt walked 470 ft on RA with rolling walker with steady gait.Marland Kitchentolerated well. Stated he has walker at home to use. To recliner after walk. NT in room.   Graylon Good, RN BSN  01/02/2018 9:37 AM

## 2018-01-02 NOTE — Progress Notes (Signed)
Patient ambulated in hallway with nursing staff and walker. Will monitor patient. Genowefa Morga Jessup RN  

## 2018-01-03 LAB — RENAL FUNCTION PANEL
ALBUMIN: 2.7 g/dL — AB (ref 3.5–5.0)
ANION GAP: 6 (ref 5–15)
BUN: 32 mg/dL — ABNORMAL HIGH (ref 8–23)
CALCIUM: 8.1 mg/dL — AB (ref 8.9–10.3)
CO2: 27 mmol/L (ref 22–32)
Chloride: 104 mmol/L (ref 98–111)
Creatinine, Ser: 1.78 mg/dL — ABNORMAL HIGH (ref 0.61–1.24)
GFR calc non Af Amer: 33 mL/min — ABNORMAL LOW (ref >60)
GFR, EST AFRICAN AMERICAN: 39 mL/min — AB (ref >60)
Glucose, Bld: 93 mg/dL (ref 70–99)
POTASSIUM: 4 mmol/L (ref 3.5–5.1)
Phosphorus: 4 mg/dL (ref 2.5–4.6)
SODIUM: 137 mmol/L (ref 135–145)

## 2018-01-03 LAB — CBC
HEMATOCRIT: 29 % — AB (ref 39.0–52.0)
HEMOGLOBIN: 9.3 g/dL — AB (ref 13.0–17.0)
MCH: 29.7 pg (ref 26.0–34.0)
MCHC: 32.1 g/dL (ref 30.0–36.0)
MCV: 92.7 fL (ref 78.0–100.0)
Platelets: 240 10*3/uL (ref 150–400)
RBC: 3.13 MIL/uL — AB (ref 4.22–5.81)
RDW: 15.4 % (ref 11.5–15.5)
WBC: 11.3 10*3/uL — ABNORMAL HIGH (ref 4.0–10.5)

## 2018-01-03 LAB — URINALYSIS, ROUTINE W REFLEX MICROSCOPIC
Bilirubin Urine: NEGATIVE
Glucose, UA: NEGATIVE mg/dL
Hgb urine dipstick: NEGATIVE
Ketones, ur: NEGATIVE mg/dL
LEUKOCYTES UA: NEGATIVE
Nitrite: NEGATIVE
Protein, ur: NEGATIVE mg/dL
SPECIFIC GRAVITY, URINE: 1.013 (ref 1.005–1.030)
pH: 7 (ref 5.0–8.0)

## 2018-01-03 MED ORDER — METOPROLOL TARTRATE 25 MG PO TABS: 25.0000 mg | ORAL_TABLET | Freq: Two times a day (BID) | ORAL | 1 refills | Status: DC

## 2018-01-03 MED ORDER — ATORVASTATIN CALCIUM 40 MG PO TABS: 40.0000 mg | ORAL_TABLET | Freq: Every day | ORAL | 1 refills | Status: DC

## 2018-01-03 MED ORDER — TRAMADOL HCL 50 MG PO TABS: 50.0000 mg | ORAL_TABLET | Freq: Two times a day (BID) | ORAL | 0 refills | Status: DC | PRN

## 2018-01-03 MED ORDER — ASPIRIN 325 MG PO TBEC: 325.0000 mg | DELAYED_RELEASE_TABLET | Freq: Every day | ORAL | 0 refills | Status: DC

## 2018-01-03 MED ORDER — FUROSEMIDE 40 MG PO TABS
40.0000 mg | ORAL_TABLET | Freq: Once | ORAL | Status: AC
  Administered 2018-01-03: 40 mg via ORAL
  Filled 2018-01-03: qty 1

## 2018-01-03 MED ORDER — AMIODARONE HCL 200 MG PO TABS: 200.0000 mg | ORAL_TABLET | Freq: Every day | ORAL | 1 refills | Status: DC

## 2018-01-03 NOTE — Progress Notes (Signed)
[id number] Education completed with pt and wife who voiced understanding. Gave heart healthy diet. Discussed staying in the tube,sternal precautions, and walking for ex. Encouraged IS and flutter valve at home. Encouraged pt to watch carbs as A1C at 5.8. Wife is diabetic and will help pt with this. Pt being referred to [facility] Phase 2. Graylon Good RN BSN 01/03/2018 1:42 PM

## 2018-01-03 NOTE — Progress Notes (Signed)
Sutures removed and benzoin and 1/2" steri-strips applied to chest tube sites. Patient tolerated well. Sites approximated.

## 2018-01-03 NOTE — Progress Notes (Signed)
Discharge instructions reviewed with patient and his wife and prescriptions given. They deny questions and agree to all follow-up instructions and appts. He is discharged from unit in stable condition via wheelchair to family vehicle.

## 2018-01-03 NOTE — Plan of Care (Signed)
  Problem: Health Behavior/Discharge Planning: Goal: Ability to manage health-related needs will improve Outcome: Adequate for Discharge   Problem: Clinical Measurements: Goal: Will remain free from infection Outcome: Adequate for Discharge Goal: Diagnostic test results will improve Outcome: Adequate for Discharge Goal: Respiratory complications will improve Outcome: Adequate for Discharge Goal: Cardiovascular complication will be avoided Outcome: Adequate for Discharge   Problem: Cardiac: Goal: Will achieve and/or maintain hemodynamic stability Outcome: Adequate for Discharge   Problem: Clinical Measurements: Goal: Postoperative complications will be avoided or minimized Outcome: Adequate for Discharge   Problem: Respiratory: Goal: Respiratory status will improve Outcome: Adequate for Discharge   Problem: Skin Integrity: Goal: Wound healing without signs and symptoms of infection Outcome: Adequate for Discharge Goal: Risk for impaired skin integrity will decrease Outcome: Adequate for Discharge

## 2018-01-03 NOTE — Consult Note (Signed)
[facility] CM Primary Care Navigator  01/03/2018  [name] December 08, 1931 [id number]   Met withpatientand wife [name]) at the bedside toidentify possible discharge needs.  Patient reportsthat he had "shortness of breath and soreness to chest and back when taking deep breaths" that had led to this admission/ surgery. (Unstable angina, coronary artery disease status post cardiac cath and CABG x 4- coronary artery bypass grafting)  Patientendorses DISH with [name] as hisprimary care provider.   [facility] [place] [facility] without[facility]y.  Patientreports managinghisownmedicationsat homewith use of "pill box" system filled weekly.  Patient's wife hasbeen driving and providing transportation to hisdoctors' appointments after discharge.  Patientliveswithwife who serves as his primary caregiver at home.   Anticipated plan for dischargeis homeaccording to patient.  Patientand wife voiced understandingto callprimarycareprovider's office when he returnshome,for a post discharge follow-upvisitwithin1- 2 weeksor sooner if needs arise.Patient letter (with PCP's contact number) was provided asareminder.  Discussed with patientand wife regarding [facility] CM services available for health managementandresourcesat homebutboth denied any needs or concerns at this point.Patient states being able to manage his health issues with wife's assistance so far.  Patient and wife voicedunderstandingof needto seek referral from primary care provider to [facility] care management ifdeemed necessary and appropriate for any services in thefuture.  [facility] care management information was provided for futureneeds that patient may have.  Patienthadverbally agreedand optedforEMMIcalls tofollow-uphis recoveryat home.   Referral made for [facility] General calls after  discharge.   For additional questions please contact:  Edwena Felty A. Kyrstan Gotwalt, BSN, RN-BC [facility] PRIMARY CARE Navigator Cell: [phone]

## 2018-01-03 NOTE — Progress Notes (Signed)
    Pt is stable for DC DC on amio 200 mg BID for several weeks. He will follow up with [place] in several weeks - anticipate lowering dose of amio to 200 mg a day at that time  He would like to follow up in [place] Previously saw Dr. Radford Pax in [place].      Mertie Moores, MD  01/03/2018 12:50 PM    [address],  [address] [place], [place]  [phone] Pager [phone] Phone: [phone]; Fax: [phone]

## 2018-01-03 NOTE — Progress Notes (Addendum)
      [address]       [place] [address]             [phone]        6 Days Post-Op Procedure(s) (LRB): CORONARY ARTERY BYPASS GRAFTING (CABG) USING LEFT INTERNAL MAMMARY ARTERY TO LAD  AND ENDOSCOPICALLY HARVESTED RIGHT SAPHENOUS VEIN TO DIAGONAL, CIRC AND PDA (N/A) TRANSESOPHAGEAL ECHOCARDIOGRAM (TEE) (N/A)  Subjective: Patient not sleeping much and he really wants to go home.  Objective: Vital signs in last 24 hours: Temp:  [97.6 F (36.4 C)-98.7 F (37.1 C)] 98.6 F (37 C) (08/20 0308) Pulse Rate:  [45-74] 68 (08/20 0004) Cardiac Rhythm: Heart block (08/20 0313) Resp:  [12-22] 15 (08/20 0308) BP: (115-155)/(56-79) 135/75 (08/20 0308) SpO2:  [92 %-99 %] 95 % (08/20 0308) Weight:  [97.8 kg] 97.8 kg (08/20 0308)  Pre op weight 98.5 kg Current Weight  01/03/18 97.8 kg      Intake/Output from previous day: 08/19 0701 - 08/20 0700 In: 720 [P.O.:720] Out: 1925 [Urine:1925]   Physical Exam:  Cardiovascular: RRR Pulmonary: Clear bilaterally Abdomen: Soft, non tender, bowel sounds present. Extremities: Trace bilateral lower extremity edema. Wounds: Clean and dry.  No erythema or signs of infection.  Lab Results: CBC: Recent Labs    01/02/18 0432 01/03/18 0306  WBC 12.6* 11.3*  HGB 9.1* 9.3*  HCT 27.7* 29.0*  PLT 218 240   BMET:  Recent Labs    01/02/18 0432 01/03/18 0306  NA 140 137  K 4.1 4.0  CL 105 104  CO2 28 27  GLUCOSE 104* 93  BUN 34* 32*  CREATININE 1.83* 1.78*  CALCIUM 8.3* 8.1*    PT/INR:  Lab Results  Component Value Date   INR 1.56 12/28/2017   INR 1.17 12/23/2017   INR 1.09 08/24/2013   ABG:  INR: Will add last result for INR, ABG once components are confirmed Will add last 4 CBG results once components are confirmed  Assessment/Plan:  1. CV - S/p NSTEMI. SR, first degree heart block.  On Amiodarone 200 mg bid, Lopressor 25 mg bid, and Amlodipine 10 mg daily. No ACE secondary to elevated  creatinine. 2.  Pulmonary - On room air this am. . Encourage incentive spirometer. 3. CKD (stahge III)=Creatinine slightly decreased to 1.78 this am (baseline 1.7) 4.  Acute blood loss anemia - H and H stable at 9.3 and 29. Continue Trinsicon. 5. Leukocytosis-WBC slightly decreased to 11,300. Remains afebrile. Could be related to pericarditis found intra op. Await UA 6. Home soon  Sharalyn Ink ZimmermanPA-C 01/03/2018,7:20 AM [phone]  Wires out Wound inatct Renal function at baseline Check urine, home later today  I have seen and examined Guy Begin and agree with the above assessment  and plan.  Grace Isaac MD Beeper [phone] Office [phone] 01/03/2018 9:07 AM

## 2018-01-05 DIAGNOSIS — I25118 Atherosclerotic heart disease of native coronary artery with other forms of angina pectoris: Secondary | ICD-10-CM | POA: Diagnosis not present

## 2018-01-05 DIAGNOSIS — N183 Chronic kidney disease, stage 3 (moderate): Secondary | ICD-10-CM | POA: Diagnosis not present

## 2018-01-05 DIAGNOSIS — Z951 Presence of aortocoronary bypass graft: Secondary | ICD-10-CM | POA: Diagnosis not present

## 2018-01-05 DIAGNOSIS — I214 Non-ST elevation (NSTEMI) myocardial infarction: Secondary | ICD-10-CM | POA: Diagnosis not present

## 2018-01-05 DIAGNOSIS — I129 Hypertensive chronic kidney disease with stage 1 through stage 4 chronic kidney disease, or unspecified chronic kidney disease: Secondary | ICD-10-CM | POA: Diagnosis not present

## 2018-01-05 DIAGNOSIS — Z48812 Encounter for surgical aftercare following surgery on the circulatory system: Secondary | ICD-10-CM | POA: Diagnosis not present

## 2018-01-09 DIAGNOSIS — N183 Chronic kidney disease, stage 3 (moderate): Secondary | ICD-10-CM | POA: Diagnosis not present

## 2018-01-09 DIAGNOSIS — Z951 Presence of aortocoronary bypass graft: Secondary | ICD-10-CM | POA: Diagnosis not present

## 2018-01-09 DIAGNOSIS — Z6831 Body mass index (BMI) 31.0-31.9, adult: Secondary | ICD-10-CM | POA: Diagnosis not present

## 2018-01-09 DIAGNOSIS — Z9889 Other specified postprocedural states: Secondary | ICD-10-CM | POA: Diagnosis not present

## 2018-01-09 DIAGNOSIS — I2510 Atherosclerotic heart disease of native coronary artery without angina pectoris: Secondary | ICD-10-CM | POA: Diagnosis not present

## 2018-01-10 ENCOUNTER — Other Ambulatory Visit: Payer: Self-pay

## 2018-01-10 DIAGNOSIS — I214 Non-ST elevation (NSTEMI) myocardial infarction: Secondary | ICD-10-CM | POA: Diagnosis not present

## 2018-01-10 DIAGNOSIS — I129 Hypertensive chronic kidney disease with stage 1 through stage 4 chronic kidney disease, or unspecified chronic kidney disease: Secondary | ICD-10-CM | POA: Diagnosis not present

## 2018-01-10 DIAGNOSIS — Z951 Presence of aortocoronary bypass graft: Secondary | ICD-10-CM | POA: Diagnosis not present

## 2018-01-10 DIAGNOSIS — I25118 Atherosclerotic heart disease of native coronary artery with other forms of angina pectoris: Secondary | ICD-10-CM | POA: Diagnosis not present

## 2018-01-10 DIAGNOSIS — Z48812 Encounter for surgical aftercare following surgery on the circulatory system: Secondary | ICD-10-CM | POA: Diagnosis not present

## 2018-01-10 DIAGNOSIS — N183 Chronic kidney disease, stage 3 (moderate): Secondary | ICD-10-CM | POA: Diagnosis not present

## 2018-01-10 NOTE — Patient Outreach (Signed)
[facility]) Care Management  01/10/2018  [name] [birth year] [id number]   EMMI: general discharge RED alert Referral date: 01/10/18 Referral reason: Lost interest in things: YES Insurance: Medicare Day # 4  Telephone call to patient regarding EMMI general discharge RED alert.  HIPAA verified with patient.  Patient gave permission to speak with his wife, [name].  Explained reason for call. Wife states he is not having any problems with lost of interest in things. Wife states patient has been doing great. She states he is actually trying to get ahead of himself and do more than he should because he has felt better.  Wife states patient has seen the primary MD since discharge from the hospital. Wife states patient has a small area on his incision and leg that has not healed completely. Wife states the primary MD ordered an ointment that she will pick up today for patient.  Wife states patient is scheduled to see the surgeon on 01/23/18. Wife states patient has the rest of his medications and takes has prescribed. Wife states she provides patients transportation to his appointments. Wife states home health is seeing patient 2 times per week.  Wife denies patient having any further needs / concerns.  RNCM discussed signs of infection with wife for patient. Advised to call doctor for these symptoms. RNCM discussed heart attack symptoms. Advised to call 911. RNCM advised patient to notify MD of any changes in condition prior to scheduled appointment. RNCM provided contact name and number: [phone] or main office number [phone] and 24 hour nurse advise line [phone].  RNCM verified patient aware of 911 services for urgent/ emergent needs.   PLAN: RNCM will close patient due to patient being assessed and having no further needs.  RNCM will send patient [facility] care management brochure/ magnet RNCm will send closure notification to patients primary MD.   Quinn Plowman RN,BSN,CCM [facility] Telephonic  [phone]

## 2018-01-12 DIAGNOSIS — I214 Non-ST elevation (NSTEMI) myocardial infarction: Secondary | ICD-10-CM | POA: Diagnosis not present

## 2018-01-12 DIAGNOSIS — Z48812 Encounter for surgical aftercare following surgery on the circulatory system: Secondary | ICD-10-CM | POA: Diagnosis not present

## 2018-01-12 DIAGNOSIS — N183 Chronic kidney disease, stage 3 (moderate): Secondary | ICD-10-CM | POA: Diagnosis not present

## 2018-01-12 DIAGNOSIS — Z951 Presence of aortocoronary bypass graft: Secondary | ICD-10-CM | POA: Diagnosis not present

## 2018-01-12 DIAGNOSIS — I25118 Atherosclerotic heart disease of native coronary artery with other forms of angina pectoris: Secondary | ICD-10-CM | POA: Diagnosis not present

## 2018-01-12 DIAGNOSIS — I129 Hypertensive chronic kidney disease with stage 1 through stage 4 chronic kidney disease, or unspecified chronic kidney disease: Secondary | ICD-10-CM | POA: Diagnosis not present

## 2018-01-18 DIAGNOSIS — I25118 Atherosclerotic heart disease of native coronary artery with other forms of angina pectoris: Secondary | ICD-10-CM | POA: Diagnosis not present

## 2018-01-18 DIAGNOSIS — I214 Non-ST elevation (NSTEMI) myocardial infarction: Secondary | ICD-10-CM | POA: Diagnosis not present

## 2018-01-18 DIAGNOSIS — Z48812 Encounter for surgical aftercare following surgery on the circulatory system: Secondary | ICD-10-CM | POA: Diagnosis not present

## 2018-01-18 DIAGNOSIS — N183 Chronic kidney disease, stage 3 (moderate): Secondary | ICD-10-CM | POA: Diagnosis not present

## 2018-01-18 DIAGNOSIS — Z951 Presence of aortocoronary bypass graft: Secondary | ICD-10-CM | POA: Diagnosis not present

## 2018-01-18 DIAGNOSIS — I129 Hypertensive chronic kidney disease with stage 1 through stage 4 chronic kidney disease, or unspecified chronic kidney disease: Secondary | ICD-10-CM | POA: Diagnosis not present

## 2018-01-20 DIAGNOSIS — Z951 Presence of aortocoronary bypass graft: Secondary | ICD-10-CM | POA: Diagnosis not present

## 2018-01-20 DIAGNOSIS — I25118 Atherosclerotic heart disease of native coronary artery with other forms of angina pectoris: Secondary | ICD-10-CM | POA: Diagnosis not present

## 2018-01-20 DIAGNOSIS — I214 Non-ST elevation (NSTEMI) myocardial infarction: Secondary | ICD-10-CM | POA: Diagnosis not present

## 2018-01-20 DIAGNOSIS — Z48812 Encounter for surgical aftercare following surgery on the circulatory system: Secondary | ICD-10-CM | POA: Diagnosis not present

## 2018-01-20 DIAGNOSIS — I129 Hypertensive chronic kidney disease with stage 1 through stage 4 chronic kidney disease, or unspecified chronic kidney disease: Secondary | ICD-10-CM | POA: Diagnosis not present

## 2018-01-20 DIAGNOSIS — N183 Chronic kidney disease, stage 3 (moderate): Secondary | ICD-10-CM | POA: Diagnosis not present

## 2018-01-23 ENCOUNTER — Ambulatory Visit: Payer: 59 | Admitting: Cardiology

## 2018-01-23 DIAGNOSIS — I214 Non-ST elevation (NSTEMI) myocardial infarction: Secondary | ICD-10-CM | POA: Diagnosis not present

## 2018-01-23 DIAGNOSIS — N183 Chronic kidney disease, stage 3 (moderate): Secondary | ICD-10-CM | POA: Diagnosis not present

## 2018-01-23 DIAGNOSIS — I129 Hypertensive chronic kidney disease with stage 1 through stage 4 chronic kidney disease, or unspecified chronic kidney disease: Secondary | ICD-10-CM | POA: Diagnosis not present

## 2018-01-23 DIAGNOSIS — Z48812 Encounter for surgical aftercare following surgery on the circulatory system: Secondary | ICD-10-CM | POA: Diagnosis not present

## 2018-01-23 DIAGNOSIS — I25118 Atherosclerotic heart disease of native coronary artery with other forms of angina pectoris: Secondary | ICD-10-CM | POA: Diagnosis not present

## 2018-01-23 DIAGNOSIS — Z951 Presence of aortocoronary bypass graft: Secondary | ICD-10-CM | POA: Diagnosis not present

## 2018-01-25 DIAGNOSIS — I214 Non-ST elevation (NSTEMI) myocardial infarction: Secondary | ICD-10-CM | POA: Diagnosis not present

## 2018-01-25 DIAGNOSIS — I129 Hypertensive chronic kidney disease with stage 1 through stage 4 chronic kidney disease, or unspecified chronic kidney disease: Secondary | ICD-10-CM | POA: Diagnosis not present

## 2018-01-25 DIAGNOSIS — Z951 Presence of aortocoronary bypass graft: Secondary | ICD-10-CM | POA: Diagnosis not present

## 2018-01-25 DIAGNOSIS — N183 Chronic kidney disease, stage 3 (moderate): Secondary | ICD-10-CM | POA: Diagnosis not present

## 2018-01-25 DIAGNOSIS — Z48812 Encounter for surgical aftercare following surgery on the circulatory system: Secondary | ICD-10-CM | POA: Diagnosis not present

## 2018-01-25 DIAGNOSIS — I25118 Atherosclerotic heart disease of native coronary artery with other forms of angina pectoris: Secondary | ICD-10-CM | POA: Diagnosis not present

## 2018-01-31 ENCOUNTER — Ambulatory Visit (INDEPENDENT_AMBULATORY_CARE_PROVIDER_SITE_OTHER): Payer: Medicare Other | Admitting: Cardiology

## 2018-01-31 ENCOUNTER — Encounter: Payer: Self-pay | Admitting: Cardiology

## 2018-01-31 ENCOUNTER — Other Ambulatory Visit: Payer: Self-pay | Admitting: Cardiology

## 2018-01-31 VITALS — BP 118/60 | HR 57 | Ht 70.0 in | Wt 212.4 lb

## 2018-01-31 DIAGNOSIS — Z951 Presence of aortocoronary bypass graft: Secondary | ICD-10-CM | POA: Diagnosis not present

## 2018-01-31 DIAGNOSIS — N289 Disorder of kidney and ureter, unspecified: Secondary | ICD-10-CM

## 2018-01-31 DIAGNOSIS — I10 Essential (primary) hypertension: Secondary | ICD-10-CM

## 2018-01-31 DIAGNOSIS — I2510 Atherosclerotic heart disease of native coronary artery without angina pectoris: Secondary | ICD-10-CM | POA: Diagnosis not present

## 2018-01-31 HISTORY — DX: Disorder of kidney and ureter, unspecified: N28.9

## 2018-01-31 NOTE — Patient Instructions (Signed)
Medication Instructions:  Your physician recommends that you continue on your current medications as directed. Please refer to the Current Medication list given to you today.  Labwork: Your physician recommends that you have the following labs drawn: BMP, CBC, TSH, liver and lipid panel. Please return to the office fasting for these labs, no appointment necessary.   Testing/Procedures: None  Follow-Up: Your physician recommends that you schedule a follow-up appointment in: 2 months  Any Other Special Instructions Will Be Listed Below (If Applicable).     If you need a refill on your cardiac medications before your next appointment, please call your pharmacy.   [place], RN, BSN

## 2018-01-31 NOTE — Progress Notes (Addendum)
Cardiology Office Note:    Date:  01/31/2018   ID:  Guy Begin, DOB [birth year], MRN [id number]  PCP:  Street, Sharon Mt, MD  Cardiologist:  Jenean Lindau, MD   Referring MD: Carlus Pavlov, MD    ASSESSMENT:    1. Coronary artery disease involving native coronary artery of native heart without angina pectoris   2. Essential hypertension   3. S/P CABG x 4   4. Renal insufficiency    PLAN:    In order of problems listed above:  1. Secondary prevention stressed with the patient.  Importance of compliance with diet and medication stressed and he vocalized understanding. 2. I reviewed records from [facility] extensively.  I obtained a quick look into his heart with ultrasound.  Echocardiogram reveals no pericardial effusion, and aortic root which is 4 cm, mild aortic regurgitation and moderate mitral regurgitation.  The study was not recorded on the machine and this was a quick view  study 3. Importance of regular exercise stressed.  He needs blood work and will be back fasting in the next few days.  I will check his lipids and consider initiating him on statin. 4. He is on amiodarone therapy and this will be tapered and discontinued by his cardiac surgeon who he is seen in the next couple of days. 5. Patient will be seen in follow-up appointment in 6 months or earlier if the patient has any concerns 6. The patient has significant renal insufficiency and this is followed by his nephrologist who he plans to see on October 1.   Medication Adjustments/Labs and Tests Ordered: Current medicines are reviewed at length with the patient today.  Concerns regarding medicines are outlined above.  No orders of the defined types were placed in this encounter.  No orders of the defined types were placed in this encounter.    History of Present Illness:    [name] is a 84 y.o. male who is being seen today for the evaluation of coronary artery disease post CABG surgery to be  established at the request of Carlus Pavlov, MD.  Patient is a pleasant 84 year old male.  He came to [place] recently with shortness of breath and was transferred to [facility] where he underwent coronary angiography and bypass surgery.  Subsequently he has done fine.  No chest pain orthopnea or PND.  He mentions to me that he is very active and walks without any problems.  At the time of my evaluation, the patient is alert awake oriented and in no distress.  His wife accompanies him for this visit and she is very happy with his progress.  Past Medical History:  Diagnosis Date  . Anemia    takes Iron pill daily  . Arthritis   . Cancer ([place]) 02/14/13   skin cancer  . Cataract    right eye  and immature  . Chest pain with high risk for cardiac etiology, abnormal Myoview 12/22/2017  . Constipation   . Depression    no meds required  . Enlarged prostate    takes Flomax daily  . Gout    takes Allopurinol daily  . Hemorrhoids    occasionally  . History of blood transfusion    lap - colon blockage  . History of bronchitis    last time in Dec 2014  . History of colon polyps   . History of UTI    takes Trimethoprim daily  . Hypertension    takes Amlodipine  and Avapro daily  . Impaired hearing    wears hearing aids  . Insomnia    takes Restoril nightly  . Joint pain   . Neuromuscular disorder ([place]) 02/14/13   damnage nerves in bil feet  . Neuropathy   . Peripheral neuropathy   . Renal calculus    large  . Shortness of breath    with exertion    Past Surgical History:  Procedure Laterality Date  . APPENDECTOMY  1964  . basal cell removed from back and face   2005  . basal cell removed from cheek and nose  2009  . basal cell removed from left ear  2014  . COLONOSCOPY  2010  . CORONARY ARTERY BYPASS GRAFT N/A 12/28/2017   Procedure: CORONARY ARTERY BYPASS GRAFTING (CABG) USING LEFT INTERNAL MAMMARY ARTERY TO LAD  AND ENDOSCOPICALLY HARVESTED RIGHT SAPHENOUS VEIN  TO DIAGONAL, CIRC AND PDA;  Surgeon: Grace Isaac, MD;  Location: [facility];  Service: Open Heart Surgery;  Laterality: N/A;  . ESOPHAGOGASTRODUODENOSCOPY    . HEMORRHOID SURGERY  1970/2006  . INTRAMEDULLARY (IM) NAIL INTERTROCHANTERIC Right 02/15/2013   Procedure: INTRAMEDULLARY (IM) NAIL INTERTROCHANTRIC;  Surgeon: Rozanna Box, MD;  Location: [place];  Service: Orthopedics;  Laterality: Right;  . JOINT REPLACEMENT Left 09   knee  . laparoscopic surgery   2006   d/t blockage in intestine   . LEFT HEART CATH AND CORONARY ANGIOGRAPHY N/A 12/23/2017   Procedure: LEFT HEART CATH AND CORONARY ANGIOGRAPHY;  Surgeon: Martinique, Peter M, MD;  Location: [facility];  Service: Cardiovascular;  Laterality: N/A;  . LUMBAR LAMINECTOMY/DECOMPRESSION MICRODISCECTOMY Left 06/01/2013   Procedure: LEFT LUMBAR THREE-FOUR DECOMPRESSION LUMBAR LAMINECTOMY/MICRODISCECTOMY;POSSIBLE LUMBAR FOUR-FIVE;  Surgeon: Charlie Pitter, MD;  Location: [place] [facility];  Service: Neurosurgery;  Laterality: Left;  left  . right hip surgery  2014  . TEE WITHOUT CARDIOVERSION N/A 12/28/2017   Procedure: TRANSESOPHAGEAL ECHOCARDIOGRAM (TEE);  Surgeon: Grace Isaac, MD;  Location: [place];  Service: Open Heart Surgery;  Laterality: N/A;  . TONSILLECTOMY  1970/2006  . TOTAL KNEE ARTHROPLASTY Right 09/03/2013   Procedure: RIGHT TOTAL KNEE ARTHROPLASTY;  Surgeon: Vickey Huger, MD;  Location: [place];  Service: Orthopedics;  Laterality: Right;    Current Medications: Current Meds  Medication Sig  . acetaminophen (TYLENOL) 500 MG tablet Take 1,000 mg by mouth every 6 (six) hours as needed for pain.  Marland Kitchen allopurinol (ZYLOPRIM) 300 MG tablet Take 150 mg by mouth every morning.   Marland Kitchen amiodarone (PACERONE) 200 MG tablet Take 1 tablet (200 mg total) by mouth daily.  Marland Kitchen amLODipine (NORVASC) 10 MG tablet Take 10 mg by mouth every morning.  Marland Kitchen aspirin EC 325 MG EC tablet Take 1 tablet (325 mg total) by mouth daily.  . diphenhydramine-acetaminophen  (TYLENOL PM) 25-500 MG TABS tablet Take 2 tablets by mouth at bedtime as needed (for sleep).  . metoprolol tartrate (LOPRESSOR) 25 MG tablet Take 1 tablet (25 mg total) by mouth 2 (two) times daily.  . Multiple Vitamin (MULTIVITAMIN WITH MINERALS) TABS tablet Take 1 tablet by mouth daily.  . sertraline (ZOLOFT) 50 MG tablet Take 50 mg by mouth daily.  . tamsulosin (FLOMAX) 0.4 MG CAPS capsule Take 0.4 mg by mouth daily after supper.   . temazepam (RESTORIL) 15 MG capsule Take 15 mg by mouth at bedtime as needed for sleep.  . traMADol (ULTRAM) 50 MG tablet Take 1 tablet (50 mg total) by mouth every 12 (twelve) hours  as needed for moderate pain.  . vitamin B-12 (CYANOCOBALAMIN) 500 MCG tablet Take 500 mcg by mouth daily.  . vitamin C (ASCORBIC ACID) 500 MG tablet Take 500 mg by mouth daily.     Allergies:   Ativan [lorazepam] and Adhesive [tape]   Social History   Socioeconomic History  . Marital status: Married    Spouse name: Not on file  . Number of children: Not on file  . Years of education: Not on file  . Highest education level: Not on file  Occupational History  . Occupation: Retired  Scientific laboratory technician  . Financial resource strain: Not on file  . Food insecurity:    Worry: Not on file    Inability: Not on file  . Transportation needs:    Medical: Not on file    Non-medical: Not on file  Tobacco Use  . Smoking status: Never Smoker  . Smokeless tobacco: Never Used  Substance and Sexual Activity  . Alcohol use: No  . Drug use: No  . Sexual activity: Not Currently  Lifestyle  . Physical activity:    Days per week: Not on file    Minutes per session: Not on file  . Stress: Not on file  Relationships  . Social connections:    Talks on phone: Not on file    Gets together: Not on file    Attends religious service: Not on file    Active member of club or organization: Not on file    Attends meetings of clubs or organizations: Not on file    Relationship status: Not on file    Other Topics Concern  . Not on file  Social History Narrative  . Not on file     Family History: The patient's family history includes Emphysema in his mother; Heart attack (age of onset: 45) in his father; Rheum arthritis in his sister.  ROS:   Please see the history of present illness.    All other systems reviewed and are negative.  EKGs/Labs/Other Studies Reviewed:    The following studies were reviewed today: I discussed my findings with the patient at extensive length.   Recent Labs: 12/26/2017: ALT 18 12/29/2017: Magnesium 2.3 01/03/2018: BUN 32; Creatinine, Ser 1.78; Hemoglobin 9.3; Platelets 240; Potassium 4.0; Sodium 137  Recent Lipid Panel    Component Value Date/Time   CHOL 112 12/23/2017 0445   TRIG 52 12/23/2017 0445   HDL 37 (L) 12/23/2017 0445   CHOLHDL 3.0 12/23/2017 0445   VLDL 10 12/23/2017 0445   LDLCALC 65 12/23/2017 0445    Physical Exam:    VS:  BP 118/60 (BP Location: Right Arm, Patient Position: Sitting, Cuff Size: Normal)   Pulse (!) 57   Ht 5\' 10"  (1.778 m)   Wt 212 lb 6.4 oz (96.3 kg)   SpO2 98%   BMI 30.48 kg/m     Wt Readings from Last 3 Encounters:  01/31/18 212 lb 6.4 oz (96.3 kg)  01/03/18 215 lb 9.8 oz (97.8 kg)  09/03/13 236 lb (107 kg)     GEN: Patient is in no acute distress HEENT: Normal NECK: No JVD; No carotid bruits LYMPHATICS: No lymphadenopathy CARDIAC: S1 S2 regular, 2/6 systolic murmur at the apex. RESPIRATORY:  Clear to auscultation without rales, wheezing or rhonchi  ABDOMEN: Soft, non-tender, non-distended MUSCULOSKELETAL:  No edema; No deformity  SKIN: Warm and dry NEUROLOGIC:  Alert and oriented x 3 PSYCHIATRIC:  Normal affect    Signed, Jenean Lindau, MD  01/31/2018 2:12 PM    [facility]

## 2018-02-01 ENCOUNTER — Other Ambulatory Visit: Payer: Self-pay | Admitting: Physician Assistant

## 2018-02-03 ENCOUNTER — Other Ambulatory Visit: Payer: Self-pay | Admitting: Physician Assistant

## 2018-02-03 ENCOUNTER — Other Ambulatory Visit: Payer: Self-pay | Admitting: Cardiothoracic Surgery

## 2018-02-03 DIAGNOSIS — I2510 Atherosclerotic heart disease of native coronary artery without angina pectoris: Secondary | ICD-10-CM | POA: Diagnosis not present

## 2018-02-03 DIAGNOSIS — Z951 Presence of aortocoronary bypass graft: Secondary | ICD-10-CM

## 2018-02-03 LAB — BASIC METABOLIC PANEL
BUN/Creatinine Ratio: 11 (ref 10–24)
BUN: 20 mg/dL (ref 8–27)
CHLORIDE: 102 mmol/L (ref 96–106)
CO2: 24 mmol/L (ref 20–29)
CREATININE: 1.79 mg/dL — AB (ref 0.76–1.27)
Calcium: 9.1 mg/dL (ref 8.6–10.2)
GFR calc Af Amer: 39 mL/min/{1.73_m2} — ABNORMAL LOW (ref >59)
GFR calc non Af Amer: 34 mL/min/{1.73_m2} — ABNORMAL LOW (ref >59)
GLUCOSE: 87 mg/dL (ref 65–99)
Potassium: 4.6 mmol/L (ref 3.5–5.2)
Sodium: 139 mmol/L (ref 134–144)

## 2018-02-05 ENCOUNTER — Other Ambulatory Visit: Payer: Self-pay | Admitting: Physician Assistant

## 2018-02-06 ENCOUNTER — Encounter: Payer: Self-pay | Admitting: Physician Assistant

## 2018-02-06 ENCOUNTER — Telehealth: Payer: Self-pay

## 2018-02-06 ENCOUNTER — Ambulatory Visit
Admission: RE | Admit: 2018-02-06 | Discharge: 2018-02-06 | Disposition: A | Discharge status: Home / Self Care | Payer: Medicare Other | Source: Ambulatory Visit | Attending: Cardiothoracic Surgery | Admitting: Cardiothoracic Surgery

## 2018-02-06 ENCOUNTER — Ambulatory Visit (INDEPENDENT_AMBULATORY_CARE_PROVIDER_SITE_OTHER): Payer: Self-pay | Admitting: Physician Assistant

## 2018-02-06 VITALS — BP 114/70 | HR 62 | Resp 20 | Ht 70.0 in | Wt 209.0 lb

## 2018-02-06 DIAGNOSIS — Z951 Presence of aortocoronary bypass graft: Secondary | ICD-10-CM

## 2018-02-06 DIAGNOSIS — I2510 Atherosclerotic heart disease of native coronary artery without angina pectoris: Secondary | ICD-10-CM

## 2018-02-06 MED ORDER — ATORVASTATIN CALCIUM 40 MG PO TABS: 40.0000 mg | ORAL_TABLET | Freq: Every day | ORAL | 1 refills | Status: DC

## 2018-02-06 NOTE — Telephone Encounter (Signed)
Patient was called and notified of results. 

## 2018-02-06 NOTE — Telephone Encounter (Signed)
-----   Message from Jenean Lindau, MD sent at 02/06/2018 10:37 AM EDT ----- The results of the study is unremarkable. Please inform patient. I will discuss in detail at next appointment. Cc  primary care/referring physician Jenean Lindau, MD 02/06/2018 10:36 AM

## 2018-02-06 NOTE — Progress Notes (Signed)
[address]       ,[name] [address]             [phone]      [name] is a 84 y.o. male patient who is s/p CABG x 4 on 01/01/2018. The patient did not have any unusual complications in the immediate post-op period. We did arrange follow-up with nephrology due to CKD.    1. S/P CABG x 4   2. Coronary artery disease involving native heart without angina pectoris, unspecified vessel or lesion type    Past Medical History:  Diagnosis Date  . Anemia    takes Iron pill daily  . Arthritis   . Cancer ([place]) 02/14/13   skin cancer  . Cataract    right eye  and immature  . Chest pain with high risk for cardiac etiology, abnormal Myoview 12/22/2017  . Constipation   . Depression    no meds required  . Enlarged prostate    takes Flomax daily  . Gout    takes Allopurinol daily  . Hemorrhoids    occasionally  . History of blood transfusion    lap - colon blockage  . History of bronchitis    last time in Dec 2014  . History of colon polyps   . History of UTI    takes Trimethoprim daily  . Hypertension    takes Amlodipine and Avapro daily  . Impaired hearing    wears hearing aids  . Insomnia    takes Restoril nightly  . Joint pain   . Neuromuscular disorder ([place]) 02/14/13   damnage nerves in bil feet  . Neuropathy   . Peripheral neuropathy   . Renal calculus    large  . Shortness of breath    with exertion   No past surgical history pertinent negatives on file. Scheduled Meds: Continuous Infusions: PRN Meds:  Allergies  Allergen Reactions  . Ativan [Lorazepam] Other (See Comments)    Hallucinations.  Disoriented.  "Mean"  . Adhesive [Tape] Other (See Comments)    Redness     Blood pressure 114/70, pulse 62, resp. rate 20, height 5\' 10"  (1.778 m), weight 94.8 kg, SpO2 98 %.  Subjective  Today he is doing quite well from a surgical standpoint.  Objective  Cor: RRR, no murmur Pulm: CTA bilaterally Adb: no tenderness Wound: c/d/i  no erythema Ext: small amount of peripheral edema in lower extremity.   CXR:   CLINICAL DATA:  Status post coronary artery bypass graft.  EXAM: CHEST - 2 VIEW  COMPARISON:  Radiographs of January 01, 2018.  FINDINGS: The heart size and mediastinal contours are within normal limits. No pneumothorax or pleural effusion is noted. Status post coronary bypass graft. Both lungs are clear. The visualized skeletal structures are unremarkable.  IMPRESSION: No active cardiopulmonary disease.   Electronically Signed   By: Marijo Conception, M.D.   On: 02/06/2018 13:33   Assessment & Plan   [name] has been recovering well since surgery. He has been very active at home and only needed one dose of pain medication. Since he is off his narcotic pain medication, I have cleared him for driving. He also wants to pick tomatoes in his garden which would be fine to do. He isn't partaking in cardiac rehab since they have equipment at home. I suggested holding off on upper body cardio devices for another few weeks. He can increase his upper body weight limitation by  a few lbs a week. He is to continue a heart healthy diet. He already saw Cardiology and somehow his lipitor was dropped from his medication list. The patient does not have an allergy and his last LFTs were normal. He does have CKD but this should not be affected by Lipitor. I provided another prescription for 2 months worth of pills. He will follow-up with cardiology in November. Overall, his incision is healing well and he isn't having any shortness of breath with activity. I reviewed his cherst xray which did not show any pleural effusions or pneumothorax. He was doing home physical therapy but they have cleared him to work on rehab without them monitoring him. All questions were answered and there were no further concerns. He is welcome to call our office with any questions or concerns in the future. He may follow-up with [place] on a PRN  basis.   [name] 02/06/2018

## 2018-02-06 NOTE — Patient Instructions (Signed)
You are encouraged to enroll and participate in the outpatient cardiac rehab program beginning as soon as practical.  You may return to driving an automobile as long as you are no longer requiring oral narcotic pain relievers during the daytime.  It would be wise to start driving only short distances during the daylight and gradually increase from there as you feel comfortable.  Make every effort to stay physically active, get some type of exercise on a regular basis, and stick to a "heart healthy diet".  The long term benefits for regular exercise and a healthy diet are critically important to your overall health and wellbeing. 

## 2018-02-07 DIAGNOSIS — Z23 Encounter for immunization: Secondary | ICD-10-CM | POA: Diagnosis not present

## 2018-02-13 DIAGNOSIS — M79674 Pain in right toe(s): Secondary | ICD-10-CM | POA: Diagnosis not present

## 2018-02-13 DIAGNOSIS — M79675 Pain in left toe(s): Secondary | ICD-10-CM | POA: Diagnosis not present

## 2018-02-13 DIAGNOSIS — B351 Tinea unguium: Secondary | ICD-10-CM | POA: Diagnosis not present

## 2018-02-14 DIAGNOSIS — I4891 Unspecified atrial fibrillation: Secondary | ICD-10-CM | POA: Diagnosis not present

## 2018-02-14 DIAGNOSIS — Z0000 Encounter for general adult medical examination without abnormal findings: Secondary | ICD-10-CM | POA: Diagnosis not present

## 2018-02-14 DIAGNOSIS — I129 Hypertensive chronic kidney disease with stage 1 through stage 4 chronic kidney disease, or unspecified chronic kidney disease: Secondary | ICD-10-CM | POA: Diagnosis not present

## 2018-02-14 DIAGNOSIS — E785 Hyperlipidemia, unspecified: Secondary | ICD-10-CM | POA: Diagnosis not present

## 2018-02-14 DIAGNOSIS — N189 Chronic kidney disease, unspecified: Secondary | ICD-10-CM | POA: Diagnosis not present

## 2018-02-14 DIAGNOSIS — G629 Polyneuropathy, unspecified: Secondary | ICD-10-CM | POA: Diagnosis not present

## 2018-02-14 DIAGNOSIS — D631 Anemia in chronic kidney disease: Secondary | ICD-10-CM | POA: Diagnosis not present

## 2018-02-14 DIAGNOSIS — I2510 Atherosclerotic heart disease of native coronary artery without angina pectoris: Secondary | ICD-10-CM | POA: Diagnosis not present

## 2018-02-14 DIAGNOSIS — N2581 Secondary hyperparathyroidism of renal origin: Secondary | ICD-10-CM | POA: Diagnosis not present

## 2018-02-14 DIAGNOSIS — M109 Gout, unspecified: Secondary | ICD-10-CM | POA: Diagnosis not present

## 2018-02-14 DIAGNOSIS — N183 Chronic kidney disease, stage 3 (moderate): Secondary | ICD-10-CM | POA: Diagnosis not present

## 2018-03-16 ENCOUNTER — Other Ambulatory Visit: Payer: Self-pay

## 2018-03-16 ENCOUNTER — Telehealth: Payer: Self-pay | Admitting: Cardiology

## 2018-03-16 DIAGNOSIS — S46912A Strain of unspecified muscle, fascia and tendon at shoulder and upper arm level, left arm, initial encounter: Secondary | ICD-10-CM | POA: Diagnosis not present

## 2018-03-16 DIAGNOSIS — S29012A Strain of muscle and tendon of back wall of thorax, initial encounter: Secondary | ICD-10-CM | POA: Diagnosis not present

## 2018-03-16 DIAGNOSIS — E669 Obesity, unspecified: Secondary | ICD-10-CM | POA: Diagnosis not present

## 2018-03-16 DIAGNOSIS — K5901 Slow transit constipation: Secondary | ICD-10-CM | POA: Diagnosis not present

## 2018-03-16 MED ORDER — AMIODARONE HCL 200 MG PO TABS: 200.0000 mg | ORAL_TABLET | Freq: Every day | ORAL | 2 refills | Status: DC

## 2018-03-16 NOTE — Telephone Encounter (Signed)
°*  STAT* If patient is at the pharmacy, call can be transferred to refill team.   1. Which medications need to be refilled? (please list name of each medication and dose if known) Amiodarone 200mg  1 daily   2. Which pharmacy/location (including street and city if local pharmacy) is medication to be sent to? [facility] In  [place]  3. Do they need a 30 day or 90 day supply? [place]

## 2018-03-16 NOTE — Telephone Encounter (Signed)
Med refill has been sent. 

## 2018-03-22 DIAGNOSIS — Z951 Presence of aortocoronary bypass graft: Secondary | ICD-10-CM | POA: Diagnosis not present

## 2018-03-22 DIAGNOSIS — I10 Essential (primary) hypertension: Secondary | ICD-10-CM | POA: Diagnosis not present

## 2018-03-22 DIAGNOSIS — I2510 Atherosclerotic heart disease of native coronary artery without angina pectoris: Secondary | ICD-10-CM | POA: Diagnosis not present

## 2018-03-22 LAB — HEPATIC FUNCTION PANEL
ALK PHOS: 84 IU/L (ref 39–117)
ALT: 13 IU/L (ref 0–44)
AST: 15 IU/L (ref 0–40)
Albumin: 3.9 g/dL (ref 3.5–4.7)
Bilirubin Total: 0.6 mg/dL (ref 0.0–1.2)
Bilirubin, Direct: 0.19 mg/dL (ref 0.00–0.40)
Total Protein: 6.1 g/dL (ref 6.0–8.5)

## 2018-03-22 LAB — LIPID PANEL
CHOLESTEROL TOTAL: 100 mg/dL (ref 100–199)
Chol/HDL Ratio: 2.3 ratio (ref 0.0–5.0)
HDL: 44 mg/dL (ref >39)
LDL Calculated: 44 mg/dL (ref 0–99)
TRIGLYCERIDES: 59 mg/dL (ref 0–149)
VLDL Cholesterol Cal: 12 mg/dL (ref 5–40)

## 2018-03-22 LAB — CBC
HEMOGLOBIN: 11.9 g/dL — AB (ref 13.0–17.7)
Hematocrit: 35.9 % — ABNORMAL LOW (ref 37.5–51.0)
MCH: 31.7 pg (ref 26.6–33.0)
MCHC: 33.1 g/dL (ref 31.5–35.7)
MCV: 96 fL (ref 79–97)
Platelets: 130 10*3/uL — ABNORMAL LOW (ref 150–450)
RBC: 3.75 x10E6/uL — AB (ref 4.14–5.80)
RDW: 15.2 % (ref 12.3–15.4)
WBC: 7.5 10*3/uL (ref 3.4–10.8)

## 2018-03-22 LAB — BASIC METABOLIC PANEL
BUN/Creatinine Ratio: 10 (ref 10–24)
BUN: 16 mg/dL (ref 8–27)
CALCIUM: 8.7 mg/dL (ref 8.6–10.2)
CO2: 23 mmol/L (ref 20–29)
Chloride: 104 mmol/L (ref 96–106)
Creatinine, Ser: 1.62 mg/dL — ABNORMAL HIGH (ref 0.76–1.27)
GFR calc non Af Amer: 38 mL/min/{1.73_m2} — ABNORMAL LOW (ref >59)
GFR, EST AFRICAN AMERICAN: 44 mL/min/{1.73_m2} — AB (ref >59)
Glucose: 86 mg/dL (ref 65–99)
Potassium: 5.1 mmol/L (ref 3.5–5.2)
Sodium: 141 mmol/L (ref 134–144)

## 2018-03-22 LAB — TSH: TSH: 6.51 u[IU]/mL — ABNORMAL HIGH (ref 0.450–4.500)

## 2018-03-22 NOTE — Addendum Note (Signed)
Addended by: Tarri Glenn on: 03/22/2018 08:44 AM   Modules accepted: Orders

## 2018-03-23 ENCOUNTER — Telehealth: Payer: Self-pay

## 2018-03-23 NOTE — Telephone Encounter (Signed)
Patient labs were stable will mail to patients home

## 2018-03-23 NOTE — Telephone Encounter (Signed)
-----   Message from Jenean Lindau, MD sent at 03/23/2018  8:12 AM EST ----- The results of the study is unremarkable. Please inform patient.  Kidney function is stable.  Please send to primary care doctor I will discuss in detail at next appointment. Cc  primary care/referring physician Jenean Lindau, MD 03/23/2018 8:12 AM

## 2018-03-29 DIAGNOSIS — N401 Enlarged prostate with lower urinary tract symptoms: Secondary | ICD-10-CM | POA: Diagnosis not present

## 2018-03-29 DIAGNOSIS — Z125 Encounter for screening for malignant neoplasm of prostate: Secondary | ICD-10-CM | POA: Diagnosis not present

## 2018-04-03 ENCOUNTER — Encounter: Payer: Self-pay | Admitting: Cardiology

## 2018-04-03 ENCOUNTER — Ambulatory Visit (INDEPENDENT_AMBULATORY_CARE_PROVIDER_SITE_OTHER): Payer: Medicare Other | Admitting: Cardiology

## 2018-04-03 VITALS — BP 122/60 | HR 58 | Ht 70.0 in | Wt 219.0 lb

## 2018-04-03 DIAGNOSIS — I2510 Atherosclerotic heart disease of native coronary artery without angina pectoris: Secondary | ICD-10-CM

## 2018-04-03 DIAGNOSIS — E785 Hyperlipidemia, unspecified: Secondary | ICD-10-CM

## 2018-04-03 DIAGNOSIS — N289 Disorder of kidney and ureter, unspecified: Secondary | ICD-10-CM | POA: Diagnosis not present

## 2018-04-03 DIAGNOSIS — I10 Essential (primary) hypertension: Secondary | ICD-10-CM

## 2018-04-03 DIAGNOSIS — Z951 Presence of aortocoronary bypass graft: Secondary | ICD-10-CM | POA: Diagnosis not present

## 2018-04-03 MED ORDER — ATORVASTATIN CALCIUM 40 MG PO TABS: 20.0000 mg | ORAL_TABLET | Freq: Every day | ORAL | 1 refills | Status: DC

## 2018-04-03 NOTE — Progress Notes (Addendum)
Cardiology Office Note:    Date:  04/03/2018   [name], DOB [birth year], MRN [id number]  PCP:  Street, Sharon Mt, MD  Cardiologist:  Jenean Lindau, MD   Referring MD: [address] [place] Mt, *    ASSESSMENT:    1. Coronary artery disease involving native coronary artery of native heart without angina pectoris   2. Essential hypertension   3. Hyperlipidemia with target LDL less than 70   4. S/P CABG x 4   5. Renal insufficiency    PLAN:    In order of problems listed above:  1. Secondary prevention stressed with the patient.  Importance of compliance with diet and medication stressed and he vocalized understanding.  His blood pressure is stable. 2. I discussed his lab with him at extensive length.  His LDL was 44 and therefore I have asked him to cut down his statin to half dose.  He also is on amiodarone and has had no palpitations or any such arrhythmia issues.  He will cut down his amiodarone to half dose for 2 weeks and then stop it. 3. Patient will be seen in follow-up appointment in 6 months or earlier if the patient has any concerns    Medication Adjustments/Labs and Tests Ordered: Current medicines are reviewed at length with the patient today.  Concerns regarding medicines are outlined above.  No orders of the defined types were placed in this encounter.  No orders of the defined types were placed in this encounter.    No chief complaint on file.    History of Present Illness:    [name] is a 84 y.o. male.  Patient has coronary artery disease and has undergone bypass surgery.  He denies any problems at this time and takes care of activities of daily living.  He mentions to me that he is walking some on a regular basis.  He has not been very compliant with his diet and has gained weight.  No chest pain orthopnea PND or palpitations.  At the time of my evaluation, the patient is alert awake oriented and in no distress.  Past Medical History:    Diagnosis Date  . Anemia    takes Iron pill daily  . Arthritis   . Cancer ([place]) 02/14/13   skin cancer  . Cataract    right eye  and immature  . Chest pain with high risk for cardiac etiology, abnormal Myoview 12/22/2017  . Constipation   . Depression    no meds required  . Enlarged prostate    takes Flomax daily  . Gout    takes Allopurinol daily  . Hemorrhoids    occasionally  . History of blood transfusion    lap - colon blockage  . History of bronchitis    last time in Dec 2014  . History of colon polyps   . History of UTI    takes Trimethoprim daily  . Hypertension    takes Amlodipine and Avapro daily  . Impaired hearing    wears hearing aids  . Insomnia    takes Restoril nightly  . Joint pain   . Neuromuscular disorder ([place]) 02/14/13   damnage nerves in bil feet  . Neuropathy   . Peripheral neuropathy   . Renal calculus    large  . Shortness of breath    with exertion    Past Surgical History:  Procedure Laterality Date  . APPENDECTOMY  1964  . basal cell removed from back  and face   2005  . basal cell removed from cheek and nose  2009  . basal cell removed from left ear  2014  . COLONOSCOPY  2010  . CORONARY ARTERY BYPASS GRAFT N/A 12/28/2017   Procedure: CORONARY ARTERY BYPASS GRAFTING (CABG) USING LEFT INTERNAL MAMMARY ARTERY TO LAD  AND ENDOSCOPICALLY HARVESTED RIGHT SAPHENOUS VEIN TO DIAGONAL, CIRC AND PDA;  Surgeon: Grace Isaac, MD;  Location: [facility];  Service: Open Heart Surgery;  Laterality: N/A;  . ESOPHAGOGASTRODUODENOSCOPY    . HEMORRHOID SURGERY  1970/2006  . INTRAMEDULLARY (IM) NAIL INTERTROCHANTERIC Right 02/15/2013   Procedure: INTRAMEDULLARY (IM) NAIL INTERTROCHANTRIC;  Surgeon: Rozanna Box, MD;  Location: [facility];  Service: Orthopedics;  Laterality: Right;  . JOINT REPLACEMENT Left 09   knee  . laparoscopic surgery   2006   d/t blockage in intestine   . LEFT HEART CATH AND CORONARY ANGIOGRAPHY N/A 12/23/2017   Procedure: LEFT HEART  CATH AND CORONARY ANGIOGRAPHY;  Surgeon: Martinique, Peter M, MD;  Location: [facility];  Service: Cardiovascular;  Laterality: N/A;  . LUMBAR LAMINECTOMY/DECOMPRESSION MICRODISCECTOMY Left 06/01/2013   Procedure: LEFT LUMBAR THREE-FOUR DECOMPRESSION LUMBAR LAMINECTOMY/MICRODISCECTOMY;POSSIBLE LUMBAR FOUR-FIVE;  Surgeon: Charlie Pitter, MD;  Location: [facility];  Service: Neurosurgery;  Laterality: Left;  left  . right hip surgery  2014  . TEE WITHOUT CARDIOVERSION N/A 12/28/2017   Procedure: TRANSESOPHAGEAL ECHOCARDIOGRAM (TEE);  Surgeon: Grace Isaac, MD;  Location: [facility];  Service: Open Heart Surgery;  Laterality: N/A;  . TONSILLECTOMY  1970/2006  . TOTAL KNEE ARTHROPLASTY Right 09/03/2013   Procedure: RIGHT TOTAL KNEE ARTHROPLASTY;  Surgeon: Vickey Huger, MD;  Location: [facility];  Service: Orthopedics;  Laterality: Right;    Current Medications: Current Meds  Medication Sig  . acetaminophen (TYLENOL) 500 MG tablet Take 1,000 mg by mouth every 6 (six) hours as needed for pain.  Marland Kitchen allopurinol (ZYLOPRIM) 300 MG tablet Take 150 mg by mouth every morning.   Marland Kitchen amiodarone (PACERONE) 200 MG tablet Take 1 tablet (200 mg total) by mouth daily.  Marland Kitchen amLODipine (NORVASC) 10 MG tablet Take 10 mg by mouth every morning.  Marland Kitchen aspirin EC 325 MG EC tablet Take 1 tablet (325 mg total) by mouth daily.  Marland Kitchen atorvastatin (LIPITOR) 40 MG tablet Take 1 tablet (40 mg total) by mouth daily at 6 PM.  . diphenhydramine-acetaminophen (TYLENOL PM) 25-500 MG TABS tablet Take 2 tablets by mouth at bedtime as needed (for sleep).  . metoprolol tartrate (LOPRESSOR) 25 MG tablet Take 1 tablet (25 mg total) by mouth 2 (two) times daily.  . Multiple Vitamin (MULTIVITAMIN WITH MINERALS) TABS tablet Take 1 tablet by mouth daily.  . sertraline (ZOLOFT) 50 MG tablet Take 50 mg by mouth daily.  . tamsulosin (FLOMAX) 0.4 MG CAPS capsule Take 0.4 mg by mouth daily after supper.   . temazepam (RESTORIL) 15 MG capsule Take 15 mg by  mouth at bedtime as needed for sleep.  . traMADol (ULTRAM) 50 MG tablet Take 1 tablet (50 mg total) by mouth every 12 (twelve) hours as needed for moderate pain.  . vitamin B-12 (CYANOCOBALAMIN) 500 MCG tablet Take 500 mcg by mouth daily.  . vitamin C (ASCORBIC ACID) 500 MG tablet Take 500 mg by mouth daily.     Allergies:   Ativan [lorazepam] and Adhesive [tape]   Social History   Socioeconomic History  . Marital status: Married    Spouse name: Not on file  . Number  of children: Not on file  . Years of education: Not on file  . Highest education level: Not on file  Occupational History  . Occupation: Retired  Scientific laboratory technician  . Financial resource strain: Not on file  . Food insecurity:    Worry: Not on file    Inability: Not on file  . Transportation needs:    Medical: Not on file    Non-medical: Not on file  Tobacco Use  . Smoking status: Never Smoker  . Smokeless tobacco: Never Used  Substance and Sexual Activity  . Alcohol use: No  . Drug use: No  . Sexual activity: Not Currently  Lifestyle  . Physical activity:    Days per week: Not on file    Minutes per session: Not on file  . Stress: Not on file  Relationships  . Social connections:    Talks on phone: Not on file    Gets together: Not on file    Attends religious service: Not on file    Active member of club or organization: Not on file    Attends meetings of clubs or organizations: Not on file    Relationship status: Not on file  Other Topics Concern  . Not on file  Social History Narrative  . Not on file     Family History: The patient's family history includes Emphysema in his mother; Heart attack (age of onset: 55) in his father; Rheum arthritis in his sister.  ROS:   Please see the history of present illness.    All other systems reviewed and are negative.  EKGs/Labs/Other Studies Reviewed:    The following studies were reviewed today: I discussed my findings with the patient at  extensive   Recent Labs: 12/29/2017: Magnesium 2.3 03/22/2018: ALT 13; BUN 16; Creatinine, Ser 1.62; Hemoglobin 11.9; Platelets 130; Potassium 5.1; Sodium 141; TSH 6.510  Recent Lipid Panel    Component Value Date/Time   CHOL 100 03/22/2018 0844   TRIG 59 03/22/2018 0844   HDL 44 03/22/2018 0844   CHOLHDL 2.3 03/22/2018 0844   CHOLHDL 3.0 12/23/2017 0445   VLDL 10 12/23/2017 0445   LDLCALC 44 03/22/2018 0844    Physical Exam:    VS:  BP 122/60 (BP Location: Right Arm, Patient Position: Sitting, Cuff Size: Normal)   Pulse (!) 58   Ht 5\' 10"  (1.778 m)   Wt 219 lb (99.3 kg)   SpO2 98%   BMI 31.42 kg/m     Wt Readings from Last 3 Encounters:  04/03/18 219 lb (99.3 kg)  02/06/18 209 lb (94.8 kg)  01/31/18 212 lb 6.4 oz (96.3 kg)     GEN: Patient is in no acute distress HEENT: Normal NECK: No JVD; No carotid bruits LYMPHATICS: No lymphadenopathy CARDIAC: Hear sounds regular, 2/6 systolic murmur at the apex. RESPIRATORY:  Clear to auscultation without rales, wheezing or rhonchi  ABDOMEN: Soft, non-tender, non-distended MUSCULOSKELETAL:  No edema; No deformity  SKIN: Warm and dry NEUROLOGIC:  Alert and oriented x 3 PSYCHIATRIC:  Normal affect   Signed, Jenean Lindau, MD  04/03/2018 1:43 PM    [facility]    07/05/2018.  Addendum.  I received a note mentioning that the patient needed a "clearance" for colonoscopy.  I received this request yesterday in the evening.  I called the patient.  His wife picked up the phone and mention to me that the patient has good effort tolerance and is walking very regularly without any  symptoms.  Based on this I think he is not at high risk for coronary events and have released that note to the gastroenterology service.  Jenean Lindau, MD 10.14 AM

## 2018-04-03 NOTE — Patient Instructions (Signed)
Medication Instructions:  Your physician has recommended you make the following change in your medication:   CHANGE lipitor to 20 mg daily CHANGE amiodarone to 100 mg daily for 2 weeks then discontinue  If you need a refill on your cardiac medications before your next appointment, please call your pharmacy.   Lab work: None  If you have labs (blood work) drawn today and your tests are completely normal, you will receive your results only by: Marland Kitchen MyChart Message (if you have MyChart) OR . A paper copy in the mail If you have any lab test that is abnormal or we need to change your treatment, we will call you to review the results.  Testing/Procedures: None  Follow-Up: At [facility], you and your health needs are our priority.  As part of our continuing mission to provide you with exceptional heart care, we have created designated Provider Care Teams.  These Care Teams include your primary Cardiologist (physician) and Advanced Practice Providers (APPs -  Physician Assistants and Nurse Practitioners) who all work together to provide you with the care you need, when you need it.  You will need a follow up appointment in 6 months.  Please call our office 2 months in advance to schedule this appointment.  You may see another member of our [employer] Provider Team in [place]: Jenne Campus, MD . Shirlee More, MD  Any Other Special Instructions Will Be Listed Below (If Applicable).

## 2018-04-06 ENCOUNTER — Telehealth: Payer: Self-pay | Admitting: Cardiology

## 2018-04-06 ENCOUNTER — Other Ambulatory Visit: Payer: Self-pay

## 2018-04-06 MED ORDER — ATORVASTATIN CALCIUM 20 MG PO TABS: 20.0000 mg | ORAL_TABLET | Freq: Every day | ORAL | 2 refills | Status: DC

## 2018-04-06 NOTE — Telephone Encounter (Signed)
Left voicemail for the patient to call the office to answer question.

## 2018-04-06 NOTE — Telephone Encounter (Signed)
Clarified dose with wife, will send in 20 mg since wife states they have difficulty splitting the tab.

## 2018-04-06 NOTE — Telephone Encounter (Signed)
Pt walked in with questions about lipitor being doubled

## 2018-04-17 ENCOUNTER — Telehealth: Payer: Self-pay | Admitting: *Deleted

## 2018-04-17 DIAGNOSIS — M79675 Pain in left toe(s): Secondary | ICD-10-CM | POA: Diagnosis not present

## 2018-04-17 DIAGNOSIS — M79674 Pain in right toe(s): Secondary | ICD-10-CM | POA: Diagnosis not present

## 2018-04-17 DIAGNOSIS — B351 Tinea unguium: Secondary | ICD-10-CM | POA: Diagnosis not present

## 2018-04-17 MED ORDER — METOPROLOL TARTRATE 25 MG PO TABS: 25.0000 mg | ORAL_TABLET | Freq: Two times a day (BID) | ORAL | 1 refills | Status: DC

## 2018-04-17 NOTE — Telephone Encounter (Signed)
*  STAT* If patient is at the pharmacy, call can be transferred to refill team.   1. Which medications need to be refilled? (please list name of each medication and dose if known) Metoprolol 25 mg bid  2. Which pharmacy/location (including street and city if local pharmacy) is medication to be sent to?[facility] in [place]  3. Do they need a 30 day or 90 day supply? [place]

## 2018-04-17 NOTE — Telephone Encounter (Signed)
Metoprolol tartrate 25 mg twice daily refilled. 

## 2018-04-25 DIAGNOSIS — L820 Inflamed seborrheic keratosis: Secondary | ICD-10-CM | POA: Diagnosis not present

## 2018-04-25 DIAGNOSIS — L578 Other skin changes due to chronic exposure to nonionizing radiation: Secondary | ICD-10-CM | POA: Diagnosis not present

## 2018-04-25 DIAGNOSIS — L821 Other seborrheic keratosis: Secondary | ICD-10-CM | POA: Diagnosis not present

## 2018-04-25 DIAGNOSIS — L570 Actinic keratosis: Secondary | ICD-10-CM | POA: Diagnosis not present

## 2018-04-25 DIAGNOSIS — C44519 Basal cell carcinoma of skin of other part of trunk: Secondary | ICD-10-CM | POA: Diagnosis not present

## 2018-06-01 DIAGNOSIS — I2510 Atherosclerotic heart disease of native coronary artery without angina pectoris: Secondary | ICD-10-CM | POA: Diagnosis not present

## 2018-06-01 LAB — CBC WITH DIFFERENTIAL/PLATELET
Basophils Absolute: 0 10*3/uL (ref 0.0–0.2)
Basos: 0 %
EOS (ABSOLUTE): 0.2 10*3/uL (ref 0.0–0.4)
EOS: 2 %
HEMATOCRIT: 37.4 % — AB (ref 37.5–51.0)
Hemoglobin: 12.6 g/dL — ABNORMAL LOW (ref 13.0–17.7)
Immature Grans (Abs): 0 10*3/uL (ref 0.0–0.1)
Immature Granulocytes: 0 %
LYMPHS ABS: 1.2 10*3/uL (ref 0.7–3.1)
Lymphs: 18 %
MCH: 32.6 pg (ref 26.6–33.0)
MCHC: 33.7 g/dL (ref 31.5–35.7)
MCV: 97 fL (ref 79–97)
Monocytes Absolute: 0.6 10*3/uL (ref 0.1–0.9)
Monocytes: 8 %
NEUTROS ABS: 4.9 10*3/uL (ref 1.4–7.0)
Neutrophils: 72 %
Platelets: 123 10*3/uL — ABNORMAL LOW (ref 150–450)
RBC: 3.87 x10E6/uL — AB (ref 4.14–5.80)
RDW: 14.1 % (ref 11.6–15.4)
WBC: 6.9 10*3/uL (ref 3.4–10.8)

## 2018-06-02 ENCOUNTER — Telehealth: Payer: Self-pay

## 2018-06-02 NOTE — Telephone Encounter (Signed)
-----   Message from Jenean Lindau, MD sent at 06/02/2018  8:23 AM EST ----- The results of the study is unremarkable. Please inform patient. I will discuss in detail at next appointment. Cc  primary care/referring physician Jenean Lindau, MD 06/02/2018 8:23 AM

## 2018-06-02 NOTE — Telephone Encounter (Signed)
Patient called and notified of lab results. 

## 2018-06-08 DIAGNOSIS — R159 Full incontinence of feces: Secondary | ICD-10-CM | POA: Diagnosis not present

## 2018-06-08 DIAGNOSIS — R195 Other fecal abnormalities: Secondary | ICD-10-CM | POA: Diagnosis not present

## 2018-06-08 DIAGNOSIS — Z1211 Encounter for screening for malignant neoplasm of colon: Secondary | ICD-10-CM | POA: Diagnosis not present

## 2018-06-12 DIAGNOSIS — R195 Other fecal abnormalities: Secondary | ICD-10-CM | POA: Diagnosis not present

## 2018-06-12 DIAGNOSIS — N189 Chronic kidney disease, unspecified: Secondary | ICD-10-CM | POA: Diagnosis not present

## 2018-06-19 DIAGNOSIS — M79675 Pain in left toe(s): Secondary | ICD-10-CM | POA: Diagnosis not present

## 2018-06-19 DIAGNOSIS — M79674 Pain in right toe(s): Secondary | ICD-10-CM | POA: Diagnosis not present

## 2018-06-19 DIAGNOSIS — B351 Tinea unguium: Secondary | ICD-10-CM | POA: Diagnosis not present

## 2018-07-05 DIAGNOSIS — Z1211 Encounter for screening for malignant neoplasm of colon: Secondary | ICD-10-CM | POA: Diagnosis not present

## 2018-07-05 DIAGNOSIS — R195 Other fecal abnormalities: Secondary | ICD-10-CM | POA: Diagnosis not present

## 2018-07-05 DIAGNOSIS — Z86010 Personal history of colonic polyps: Secondary | ICD-10-CM | POA: Diagnosis not present

## 2018-07-18 DIAGNOSIS — N189 Chronic kidney disease, unspecified: Secondary | ICD-10-CM | POA: Diagnosis not present

## 2018-07-18 DIAGNOSIS — N183 Chronic kidney disease, stage 3 (moderate): Secondary | ICD-10-CM | POA: Diagnosis not present

## 2018-07-18 DIAGNOSIS — I2510 Atherosclerotic heart disease of native coronary artery without angina pectoris: Secondary | ICD-10-CM | POA: Diagnosis not present

## 2018-07-18 DIAGNOSIS — D631 Anemia in chronic kidney disease: Secondary | ICD-10-CM | POA: Diagnosis not present

## 2018-07-18 DIAGNOSIS — I129 Hypertensive chronic kidney disease with stage 1 through stage 4 chronic kidney disease, or unspecified chronic kidney disease: Secondary | ICD-10-CM | POA: Diagnosis not present

## 2018-07-18 DIAGNOSIS — N2581 Secondary hyperparathyroidism of renal origin: Secondary | ICD-10-CM | POA: Diagnosis not present

## 2018-07-18 DIAGNOSIS — G629 Polyneuropathy, unspecified: Secondary | ICD-10-CM | POA: Diagnosis not present

## 2018-07-18 DIAGNOSIS — I4891 Unspecified atrial fibrillation: Secondary | ICD-10-CM | POA: Diagnosis not present

## 2018-07-18 DIAGNOSIS — E785 Hyperlipidemia, unspecified: Secondary | ICD-10-CM | POA: Diagnosis not present

## 2018-07-18 DIAGNOSIS — Z0000 Encounter for general adult medical examination without abnormal findings: Secondary | ICD-10-CM | POA: Diagnosis not present

## 2018-07-18 DIAGNOSIS — M109 Gout, unspecified: Secondary | ICD-10-CM | POA: Diagnosis not present

## 2018-08-14 DIAGNOSIS — R1031 Right lower quadrant pain: Secondary | ICD-10-CM | POA: Diagnosis not present

## 2018-08-14 DIAGNOSIS — N401 Enlarged prostate with lower urinary tract symptoms: Secondary | ICD-10-CM | POA: Diagnosis not present

## 2018-08-23 DIAGNOSIS — M79675 Pain in left toe(s): Secondary | ICD-10-CM | POA: Diagnosis not present

## 2018-08-23 DIAGNOSIS — M79674 Pain in right toe(s): Secondary | ICD-10-CM | POA: Diagnosis not present

## 2018-08-23 DIAGNOSIS — B351 Tinea unguium: Secondary | ICD-10-CM | POA: Diagnosis not present

## 2018-08-25 ENCOUNTER — Telehealth: Payer: Self-pay | Admitting: Cardiology

## 2018-08-25 DIAGNOSIS — R001 Bradycardia, unspecified: Secondary | ICD-10-CM | POA: Diagnosis not present

## 2018-08-25 DIAGNOSIS — R42 Dizziness and giddiness: Secondary | ICD-10-CM | POA: Diagnosis not present

## 2018-08-25 NOTE — Telephone Encounter (Signed)
TELEPHONE CALL NOTE  [name] has been deemed a candidate for a follow-up tele-health visit to limit community exposure during the Covid-19 pandemic. I spoke with the patient via phone to ensure availability of phone/video source, confirm preferred email & phone number, and discuss instructions and expectations.  I reminded [name] to be prepared with any vital sign and/or heart rhythm information that could potentially be obtained via home monitoring, at the time of his visit. I reminded [name] to expect a phone call at the time of his visit if his visit.  Did the patient verbally acknowledge consent to treatment? YES  [name] 08/25/2018 2:46 PM    CONSENT FOR TELE-HEALTH VISIT - PLEASE REVIEW  I hereby voluntarily request, consent and authorize [facility] and its employed or contracted physicians, physician assistants, nurse practitioners or other licensed health care professionals (the Practitioner), to provide me with telemedicine health care services (the Services") as deemed necessary by the treating Practitioner. I acknowledge and consent to receive the Services by the Practitioner via telemedicine. I understand that the telemedicine visit will involve communicating with the Practitioner through live audiovisual communication technology and the disclosure of certain medical information by electronic transmission. I acknowledge that I have been given the opportunity to request an in-person assessment or other available alternative prior to the telemedicine visit and am voluntarily participating in the telemedicine visit.  I understand that I have the right to withhold or withdraw my consent to the use of telemedicine in the course of my care at any time, without affecting my right to future care or treatment, and that the Practitioner or I may terminate the telemedicine visit at any time. I understand that I have the right to inspect all information obtained  and/or recorded in the course of the telemedicine visit and may receive copies of available information for a reasonable fee.  I understand that some of the potential risks of receiving the Services via telemedicine include:   Delay or interruption in medical evaluation due to technological equipment failure or disruption;  Information transmitted may not be sufficient (e.g. poor resolution of images) to allow for appropriate medical decision making by the Practitioner; and/or   In rare instances, security protocols could fail, causing a breach of personal health information.  Furthermore, I acknowledge that it is my responsibility to provide information about my medical history, conditions and care that is complete and accurate to the best of my ability. I acknowledge that Practitioner's advice, recommendations, and/or decision may be based on factors not within their control, such as incomplete or inaccurate data provided by me or distortions of diagnostic images or specimens that may result from electronic transmissions. I understand that the practice of medicine is not an exact science and that Practitioner makes no warranties or guarantees regarding treatment outcomes. I acknowledge that I will receive a copy of this consent concurrently upon execution via email to the email address I last provided but may also request a printed copy by calling the office of [facility].    I understand that my insurance will be billed for this visit.   I have read or had this consent read to me.  I understand the contents of this consent, which adequately explains the benefits and risks of the Services being provided via telemedicine.   I have been provided ample opportunity to ask questions regarding this consent and the Services and have had my questions answered to my satisfaction.  I  give my informed consent for the services to be provided through the use of telemedicine in my medical care  By  participating in this telemedicine visit I agree to the above. YES

## 2018-08-25 NOTE — Telephone Encounter (Signed)
I said phone call from urgent care.  Entry wound because of not feeling well he was also dizzy.  EKG was done which showed sinus bradycardia rate 47 first-degree AV block but no acute ST segment changes.  He has been taking the Toprol 25 twice daily.  And I was contacted to try to help with the management of his problem.  I did review his chart carefully.  He used to be on amiodarone but does not anymore denies having a palpitations overall.  I recommended to discontinue metoprolol completely.  We will contact him next week to see how he does.  I anticipate him to improve.

## 2018-08-28 ENCOUNTER — Telehealth: Payer: Self-pay | Admitting: Cardiology

## 2018-08-28 ENCOUNTER — Telehealth: Payer: Self-pay

## 2018-08-28 ENCOUNTER — Telehealth (INDEPENDENT_AMBULATORY_CARE_PROVIDER_SITE_OTHER): Payer: Medicare Other | Admitting: Cardiology

## 2018-08-28 ENCOUNTER — Encounter: Payer: Self-pay | Admitting: Cardiology

## 2018-08-28 VITALS — BP 118/60 | HR 51 | Ht 70.0 in | Wt 220.0 lb

## 2018-08-28 DIAGNOSIS — Z951 Presence of aortocoronary bypass graft: Secondary | ICD-10-CM

## 2018-08-28 DIAGNOSIS — I2510 Atherosclerotic heart disease of native coronary artery without angina pectoris: Secondary | ICD-10-CM

## 2018-08-28 DIAGNOSIS — E785 Hyperlipidemia, unspecified: Secondary | ICD-10-CM

## 2018-08-28 MED ORDER — ASPIRIN EC 81 MG PO TBEC: 81.0000 mg | DELAYED_RELEASE_TABLET | Freq: Every day | ORAL | 3 refills | Status: DC

## 2018-08-28 NOTE — Patient Instructions (Signed)
RN called Bethena Roys pt wife and went over AVS information below. Medication discontinuations discussed. Patient scheduled for 4 mo f/u. No further questions at this time.  Medication Instructions:  DISCONTINUE aspirin 325 mg DISCONTINUE amlodipine START taking aspirin 81 mg (1 tablet) daily  If you need a refill on your cardiac medications before your next appointment, please call your pharmacy.   Lab work: NONE If you have labs (blood work) drawn today and your tests are completely normal, you will receive your results only by: Marland Kitchen MyChart Message (if you have MyChart) OR . A paper copy in the mail If you have any lab test that is abnormal or we need to change your treatment, we will call you to review the results.  Testing/Procedures: NONE Follow-Up: At [facility], you and your health needs are our priority.  As part of our continuing mission to provide you with exceptional heart care, we have created designated Provider Care Teams.  These Care Teams include your primary Cardiologist (physician) and Advanced Practice Providers (APPs -  Physician Assistants and Nurse Practitioners) who all work together to provide you with the care you need, when you need it. You will need a follow up appointment in 4 months.   Any Other Special Instructions Will Be Listed Below    Aspirin and Your Heart  Aspirin is a medicine that prevents the cells in the blood that are used for clotting, called platelets, from sticking together. Aspirin can be used to help reduce the risk of blood clots, heart attacks, and other heart-related problems. Can I take aspirin? Your health care provider will help you determine whether it is safe and beneficial for you to take aspirin daily. Taking aspirin daily may be helpful if you:  Have had a heart attack or chest pain.  Are at risk for a heart attack.  Have undergone open-heart surgery, such as coronary artery bypass surgery (CABG).  Have had coronary  angioplasty or a stent.  Have had certain types of stroke or transient ischemic attack (TIA).  Have peripheral artery disease (PAD).  Have chronic heart rhythm problems such as atrial fibrillation and cannot take an anticoagulant.  Have valve disease or have had surgery on a valve. What are the risks? Daily use of aspirin can cause side effects. Some of these include:  Bleeding. Bleeding problems can be minor or serious. An example of a minor problem is a cut that does not stop bleeding. An example of a more serious problem is stomach bleeding or, rarely, bleeding into the brain. Your risk of bleeding is increased if you are also taking non-steroidal anti-inflammatory drugs (NSAIDs).  Increased bruising.  Upset stomach.  An allergic reaction. People who have nasal polyps have an increased risk of developing an aspirin allergy. General guidelines  Take aspirin only as told by your health care provider. Make sure that you understand how much you should take and what form you should take. The two forms of aspirin are: ? Non-enteric-coated.This type of aspirin does not have a coating and is absorbed quickly. This type of aspirin also comes in a chewable form. ? Enteric-coated. This type of aspirin has a coating that releases the medicine very slowly. Enteric-coated aspirin might cause less stomach upset than non-enteric-coated aspirin. This type of aspirin should not be chewed or crushed.  Limit alcohol intake to no more than 1 drink a day for nonpregnant women and 2 drinks a day for men. Drinking alcohol increases your risk of bleeding. One drink  equals 12 oz of beer, 5 oz of wine, or 1 oz of hard liquor. Contact a health care provider if you:  Have unusual bleeding or bruising.  Have stomach pain or nausea.  Have ringing in your ears.  Have an allergic reaction that causes: ? Hives. ? Itchy skin. ? Swelling of the lips, tongue, or face. Get help right away if you:  Notice that  your bowel movements are bloody, dark red, or black in color.  Vomit or cough up blood.  Have blood in your urine.  Cough, have noisy breathing (wheeze), or feel short of breath.  Have chest pain, especially if the pain spreads to the arms, back, neck, or jaw.  Have a severe headache, or a headache with confusion, or dizziness. These symptoms may represent a serious problem that is an emergency. Do not wait to see if the symptoms will go away. Get medical help right away. Call your local emergency services (911 in the [place] Do not drive yourself to the hospital. Summary  Aspirin can be used to help reduce the risk of blood clots, heart attacks, and other heart-related problems.  Daily use of aspirin can increase your risk of side effects. Your health care provider will help you determine whether it is safe and beneficial for you to take aspirin daily.  Take aspirin only as told by your health care provider. Make sure that you understand how much you can take and what form you can take. This information is not intended to replace advice given to you by your health care provider. Make sure you discuss any questions you have with your health care provider. Document Released: 04/15/2008 Document Revised: 03/03/2017 Document Reviewed: 03/03/2017 Elsevier Interactive Patient Education  2019 [employer].

## 2018-08-28 NOTE — Progress Notes (Signed)
Virtual Visit via Telephone Note   This visit type was conducted due to national recommendations for restrictions regarding the COVID-19 Pandemic (e.g. social distancing) in an effort to limit this patient's exposure and mitigate transmission in our community.  Due to his co-morbid illnesses, this patient is at least at moderate risk for complications without adequate follow up.  This format is felt to be most appropriate for this patient at this time.  The patient did not have access to video technology/had technical difficulties with video requiring transitioning to audio format only (telephone).  All issues noted in this document were discussed and addressed.  No physical exam could be performed with this format.  Please refer to the patient's chart for his  consent to telehealth for [facility].   Evaluation Performed:  Follow-up visit  Date:  08/28/2018   [name], DOB Dec 21, 1931, MRN [id number]  Patient Location: Home  Provider Location: Home  PCP:  Street, Sharon Mt, MD  Cardiologist:  No primary care provider on file.  Electrophysiologist:  None   Chief Complaint: Coronary artery disease  History of Present Illness:    [name] is a 85 y.o. male who presents via audio/video conferencing for a telehealth visit today.    Patient denies any problems at this time and takes care of activities of daily living.  He ambulates age appropriately.  No chest pain orthopnea or PND.  At the time of my evaluation, the patient is alert awake oriented and in no distress.  The patient does not have symptoms concerning for COVID-19 infection (fever, chills, cough, or new shortness of breath).    Past Medical History:  Diagnosis Date  . Anemia    takes Iron pill daily  . Arthritis   . Cancer ([place]) 02/14/13   skin cancer  . Cataract    right eye  and immature  . Chest pain with high risk for cardiac etiology, abnormal Myoview 12/22/2017  . Constipation   . Depression    no meds required  . Enlarged prostate    takes Flomax daily  . Gout    takes Allopurinol daily  . Hemorrhoids    occasionally  . History of blood transfusion    lap - colon blockage  . History of bronchitis    last time in Dec 2014  . History of colon polyps   . History of UTI    takes Trimethoprim daily  . Hypertension    takes Amlodipine and Avapro daily  . Impaired hearing    wears hearing aids  . Insomnia    takes Restoril nightly  . Joint pain   . Neuromuscular disorder ([place]) 02/14/13   damnage nerves in bil feet  . Neuropathy   . Peripheral neuropathy   . Renal calculus    large  . Shortness of breath    with exertion   Past Surgical History:  Procedure Laterality Date  . APPENDECTOMY  1964  . basal cell removed from back and face   2005  . basal cell removed from cheek and nose  2009  . basal cell removed from left ear  2014  . COLONOSCOPY  2010  . CORONARY ARTERY BYPASS GRAFT N/A 12/28/2017   Procedure: CORONARY ARTERY BYPASS GRAFTING (CABG) USING LEFT INTERNAL MAMMARY ARTERY TO LAD  AND ENDOSCOPICALLY HARVESTED RIGHT SAPHENOUS VEIN TO DIAGONAL, CIRC AND PDA;  Surgeon: Grace Isaac, MD;  Location: [facility];  Service: Open Heart Surgery;  Laterality: N/A;  .  ESOPHAGOGASTRODUODENOSCOPY    . HEMORRHOID SURGERY  1970/2006  . INTRAMEDULLARY (IM) NAIL INTERTROCHANTERIC Right 02/15/2013   Procedure: INTRAMEDULLARY (IM) NAIL INTERTROCHANTRIC;  Surgeon: Rozanna Box, MD;  Location: [facility];  Service: Orthopedics;  Laterality: Right;  . JOINT REPLACEMENT Left 09   knee  . laparoscopic surgery   2006   d/t blockage in intestine   . LEFT HEART CATH AND CORONARY ANGIOGRAPHY N/A 12/23/2017   Procedure: LEFT HEART CATH AND CORONARY ANGIOGRAPHY;  Surgeon: Martinique, Peter M, MD;  Location: [facility];  Service: Cardiovascular;  Laterality: N/A;  . LUMBAR LAMINECTOMY/DECOMPRESSION MICRODISCECTOMY Left 06/01/2013   Procedure: LEFT LUMBAR THREE-FOUR DECOMPRESSION LUMBAR  LAMINECTOMY/MICRODISCECTOMY;POSSIBLE LUMBAR FOUR-FIVE;  Surgeon: Charlie Pitter, MD;  Location: [facility];  Service: Neurosurgery;  Laterality: Left;  left  . right hip surgery  2014  . TEE WITHOUT CARDIOVERSION N/A 12/28/2017   Procedure: TRANSESOPHAGEAL ECHOCARDIOGRAM (TEE);  Surgeon: Grace Isaac, MD;  Location: [facility];  Service: Open Heart Surgery;  Laterality: N/A;  . TONSILLECTOMY  1970/2006  . TOTAL KNEE ARTHROPLASTY Right 09/03/2013   Procedure: RIGHT TOTAL KNEE ARTHROPLASTY;  Surgeon: Vickey Huger, MD;  Location: [facility];  Service: Orthopedics;  Laterality: Right;     Current Meds  Medication Sig  . acetaminophen (TYLENOL) 500 MG tablet Take 1,000 mg by mouth every 6 (six) hours as needed for pain.  Marland Kitchen allopurinol (ZYLOPRIM) 300 MG tablet Take 150 mg by mouth every morning.   Marland Kitchen amLODipine (NORVASC) 10 MG tablet Take 10 mg by mouth every morning.  Marland Kitchen aspirin EC 325 MG EC tablet Take 1 tablet (325 mg total) by mouth daily.  . Multiple Vitamin (MULTIVITAMIN WITH MINERALS) TABS tablet Take 1 tablet by mouth daily.  . sertraline (ZOLOFT) 50 MG tablet Take 50 mg by mouth as needed.   . tamsulosin (FLOMAX) 0.4 MG CAPS capsule Take 0.4 mg by mouth daily after supper.   . temazepam (RESTORIL) 15 MG capsule Take 15 mg by mouth at bedtime as needed for sleep.  . traMADol (ULTRAM) 50 MG tablet Take 1 tablet (50 mg total) by mouth every 12 (twelve) hours as needed for moderate pain.  . vitamin B-12 (CYANOCOBALAMIN) 500 MCG tablet Take 500 mcg by mouth daily.  . vitamin C (ASCORBIC ACID) 500 MG tablet Take 500 mg by mouth daily.     Allergies:   Ativan [lorazepam] and Adhesive [tape]   Social History   Tobacco Use  . Smoking status: Never Smoker  . Smokeless tobacco: Never Used  Substance Use Topics  . Alcohol use: No  . Drug use: No     Family Hx: The patient's family history includes Emphysema in his mother; Heart attack (age of onset: 12) in his father; Rheum arthritis in his  sister.  ROS:   Please see the history of present illness.    Patient denies any chest pain orthopnea or PND and system review was negative All other systems reviewed and are negative.   Prior CV studies:   The following studies were reviewed today:  Coronary angiography report was discussed with the patient at length  Labs/Other Tests and Data Reviewed:    EKG:  No ECG reviewed.  Recent Labs: 12/29/2017: Magnesium 2.3 03/22/2018: ALT 13; BUN 16; Creatinine, Ser 1.62; Potassium 5.1; Sodium 141; TSH 6.510 06/01/2018: Hemoglobin 12.6; Platelets 123   Recent Lipid Panel Lab Results  Component Value Date/Time   CHOL 100 03/22/2018 08:44 AM   TRIG 59 03/22/2018 08:44  AM   HDL 44 03/22/2018 08:44 AM   CHOLHDL 2.3 03/22/2018 08:44 AM   CHOLHDL 3.0 12/23/2017 04:45 AM   LDLCALC 44 03/22/2018 08:44 AM    Wt Readings from Last 3 Encounters:  08/28/18 220 lb (99.8 kg)  04/03/18 219 lb (99.3 kg)  02/06/18 209 lb (94.8 kg)     Objective:    Vital Signs:  BP 118/60 (BP Location: Left Arm, Patient Position: Sitting, Cuff Size: Normal)   Pulse (!) 51   Ht 5\' 10"  (1.778 m)   Wt 220 lb (99.8 kg)   BMI 31.57 kg/m    Well nourished, well developed male in no acute distress. Patient was comfortable over the phone and denies any problems he appears cheerful  ASSESSMENT & PLAN:    1. Coronary artery disease: Secondary prevention stressed with the patient.  Importance of compliance with diet and medication stressed and he vocalized understanding.  His blood pressure is stable.  Diet was discussed for dyslipidemia. 2. Lipidemia: He is supposed to get blood work done in the next 2 weeks with his primary care physician. 3. Patient is not taking amlodipine at this time because of low blood pressure issues and feeling better.  He went to the urgent care center amlodipine and he feels much better.  He takes 81 mg of coated aspirin on a daily basis. 4. Patient will be seen in follow-up  appointment in 4 months or earlier if the patient has any concerns.   COVID-19 Education: The signs and symptoms of COVID-19 were discussed with the patient and how to seek care for testing (follow up with PCP or arrange E-visit).  The importance of social distancing was discussed today.  Time:   Today, I have spent 12 minutes with the patient with telehealth technology discussing the above problems.     Medication Adjustments/Labs and Tests Ordered: Current medicines are reviewed at length with the patient today.  Concerns regarding medicines are outlined above.  Tests Ordered: No orders of the defined types were placed in this encounter.  Medication Changes: No orders of the defined types were placed in this encounter.   Disposition:  Follow up in 4 month(s)  Signed, Jenean Lindau, MD  08/28/2018 9:13 AM    [place]

## 2018-08-28 NOTE — Addendum Note (Signed)
Addended by: Beckey Rutter on: 08/28/2018 10:46 AM   Modules accepted: Orders

## 2018-08-28 NOTE — Telephone Encounter (Signed)
Called patient to initiate 9 am televisit. Patient is not available and does not have a cell phone. Nurse will call back closer to 9 am.

## 2018-08-28 NOTE — Telephone Encounter (Signed)
Virtual Visit Pre-Appointment Phone Call  Steps For Call:  1. Confirm consent - "In the setting of the current Covid19 crisis, you are scheduled for a (phone or video) visit with your provider on (date) at (time).  Just as we do with many in-office visits, in order for you to participate in this visit, we must obtain consent.  If you'd like, I can send this to your mychart (if signed up) or email for you to review.  Otherwise, I can obtain your verbal consent now.  All virtual visits are billed to your insurance company just like a normal visit would be.  By agreeing to a virtual visit, we'd like you to understand that the technology does not allow for your provider to perform an examination, and thus may limit your provider's ability to fully assess your condition.  Finally, though the technology is pretty good, we cannot assure that it will always work on either your or our end, and in the setting of a video visit, we may have to convert it to a phone-only visit.  In either situation, we cannot ensure that we have a secure connection.  Are you willing to proceed?"  2. Give patient instructions for WebEx download to smartphone as below if video visit  3. Advise patient to be prepared with any vital sign or heart rhythm information, their current medicines, and a piece of paper and pen handy for any instructions they may receive the day of their visit  4. Inform patient they will receive a phone call 15 minutes prior to their appointment time (may be from unknown caller ID) so they should be prepared to answer  5. Confirm that appointment type is correct in Epic appointment notes (video vs telephone)    TELEPHONE CALL NOTE  [name] has been deemed a candidate for a follow-up tele-health visit to limit community exposure during the Covid-19 pandemic. I spoke with the patient via phone to ensure availability of phone/video source, confirm preferred email & phone number, and discuss  instructions and expectations.  I reminded [name] to be prepared with any vital sign and/or heart rhythm information that could potentially be obtained via home monitoring, at the time of his visit. I reminded [name] to expect a phone call at the time of his visit if his visit.  Did the patient verbally acknowledge consent to treatment? YES [name] 08/28/2018 8:43 AM   DOWNLOADING THE WEBEX SOFTWARE TO SMARTPHONE  - If Apple, go to [employer] and type in WebEx in the search bar. [employer], the blue/green circle. The app is free but as with any other app downloads, their phone may require them to verify saved payment information or Apple password. The patient does NOT have to create an account.  - If Android, ask patient to go to [employer] and type in WebEx in the search bar. [employer], the blue/green circle. The app is free but as with any other app downloads, their phone may require them to verify saved payment information or Android password. The patient does NOT have to create an account.   CONSENT FOR TELE-HEALTH VISIT - PLEASE REVIEW  I hereby voluntarily request, consent and authorize [facility] and its employed or contracted physicians, physician assistants, nurse practitioners or other licensed health care professionals (the Practitioner), to provide me with telemedicine health care services (the Services") as deemed necessary by the treating Practitioner. I acknowledge and consent to  receive the Services by the Practitioner via telemedicine. I understand that the telemedicine visit will involve communicating with the Practitioner through live audiovisual communication technology and the disclosure of certain medical information by electronic transmission. I acknowledge that I have been given the opportunity to request an in-person assessment or other available alternative prior to the telemedicine visit and am voluntarily  participating in the telemedicine visit.  I understand that I have the right to withhold or withdraw my consent to the use of telemedicine in the course of my care at any time, without affecting my right to future care or treatment, and that the Practitioner or I may terminate the telemedicine visit at any time. I understand that I have the right to inspect all information obtained and/or recorded in the course of the telemedicine visit and may receive copies of available information for a reasonable fee.  I understand that some of the potential risks of receiving the Services via telemedicine include:   Delay or interruption in medical evaluation due to technological equipment failure or disruption;  Information transmitted may not be sufficient (e.g. poor resolution of images) to allow for appropriate medical decision making by the Practitioner; and/or   In rare instances, security protocols could fail, causing a breach of personal health information.  Furthermore, I acknowledge that it is my responsibility to provide information about my medical history, conditions and care that is complete and accurate to the best of my ability. I acknowledge that Practitioner's advice, recommendations, and/or decision may be based on factors not within their control, such as incomplete or inaccurate data provided by me or distortions of diagnostic images or specimens that may result from electronic transmissions. I understand that the practice of medicine is not an exact science and that Practitioner makes no warranties or guarantees regarding treatment outcomes. I acknowledge that I will receive a copy of this consent concurrently upon execution via email to the email address I last provided but may also request a printed copy by calling the office of [facility].    I understand that my insurance will be billed for this visit.   I have read or had this consent read to me.  I understand the contents of this  consent, which adequately explains the benefits and risks of the Services being provided via telemedicine.   I have been provided ample opportunity to ask questions regarding this consent and the Services and have had my questions answered to my satisfaction.  I give my informed consent for the services to be provided through the use of telemedicine in my medical care  By participating in this telemedicine visit I agree to the above.

## 2018-09-06 DIAGNOSIS — N183 Chronic kidney disease, stage 3 (moderate): Secondary | ICD-10-CM | POA: Diagnosis not present

## 2018-09-27 DIAGNOSIS — H353131 Nonexudative age-related macular degeneration, bilateral, early dry stage: Secondary | ICD-10-CM | POA: Diagnosis not present

## 2018-09-27 DIAGNOSIS — E11311 Type 2 diabetes mellitus with unspecified diabetic retinopathy with macular edema: Secondary | ICD-10-CM | POA: Diagnosis not present

## 2018-10-04 ENCOUNTER — Ambulatory Visit: Payer: 59 | Admitting: Cardiology

## 2018-10-23 DIAGNOSIS — L821 Other seborrheic keratosis: Secondary | ICD-10-CM | POA: Diagnosis not present

## 2018-10-23 DIAGNOSIS — C44519 Basal cell carcinoma of skin of other part of trunk: Secondary | ICD-10-CM | POA: Diagnosis not present

## 2018-10-23 DIAGNOSIS — L578 Other skin changes due to chronic exposure to nonionizing radiation: Secondary | ICD-10-CM | POA: Diagnosis not present

## 2018-10-23 DIAGNOSIS — L570 Actinic keratosis: Secondary | ICD-10-CM | POA: Diagnosis not present

## 2018-10-23 DIAGNOSIS — D2362 Other benign neoplasm of skin of left upper limb, including shoulder: Secondary | ICD-10-CM | POA: Diagnosis not present

## 2018-10-31 DIAGNOSIS — M67372 Transient synovitis, left ankle and foot: Secondary | ICD-10-CM | POA: Diagnosis not present

## 2018-12-14 DIAGNOSIS — N401 Enlarged prostate with lower urinary tract symptoms: Secondary | ICD-10-CM | POA: Diagnosis not present

## 2018-12-14 DIAGNOSIS — N3289 Other specified disorders of bladder: Secondary | ICD-10-CM | POA: Diagnosis not present

## 2018-12-18 ENCOUNTER — Telehealth (INDEPENDENT_AMBULATORY_CARE_PROVIDER_SITE_OTHER): Payer: Self-pay | Admitting: Cardiology

## 2018-12-18 ENCOUNTER — Other Ambulatory Visit: Payer: Self-pay

## 2018-12-18 ENCOUNTER — Telehealth: Payer: Self-pay | Admitting: Cardiology

## 2018-12-18 DIAGNOSIS — I10 Essential (primary) hypertension: Secondary | ICD-10-CM | POA: Diagnosis not present

## 2018-12-18 DIAGNOSIS — N183 Chronic kidney disease, stage 3 (moderate): Secondary | ICD-10-CM | POA: Diagnosis not present

## 2018-12-18 DIAGNOSIS — Z6832 Body mass index (BMI) 32.0-32.9, adult: Secondary | ICD-10-CM | POA: Diagnosis not present

## 2018-12-18 MED ORDER — ATORVASTATIN CALCIUM 20 MG PO TABS: 20.0000 mg | ORAL_TABLET | Freq: Every day | ORAL | 2 refills | Status: DC

## 2018-12-18 NOTE — Telephone Encounter (Signed)
Refill sent in by Annye Asa, RN.

## 2018-12-18 NOTE — Telephone Encounter (Signed)
Call atorvastatin #[phone] to [facility] [place]

## 2018-12-19 ENCOUNTER — Telehealth (INDEPENDENT_AMBULATORY_CARE_PROVIDER_SITE_OTHER): Payer: Medicare Other | Admitting: Cardiology

## 2018-12-19 ENCOUNTER — Encounter: Payer: Self-pay | Admitting: Cardiology

## 2018-12-19 ENCOUNTER — Telehealth: Payer: Self-pay

## 2018-12-19 VITALS — BP 160/99 | Ht 70.0 in | Wt 210.0 lb

## 2018-12-19 DIAGNOSIS — I2510 Atherosclerotic heart disease of native coronary artery without angina pectoris: Secondary | ICD-10-CM

## 2018-12-19 DIAGNOSIS — I10 Essential (primary) hypertension: Secondary | ICD-10-CM

## 2018-12-19 DIAGNOSIS — Z951 Presence of aortocoronary bypass graft: Secondary | ICD-10-CM

## 2018-12-19 DIAGNOSIS — N289 Disorder of kidney and ureter, unspecified: Secondary | ICD-10-CM

## 2018-12-19 NOTE — Telephone Encounter (Signed)
Patient had an 0805 virtual visit with Dr. Docia Furl this morning.

## 2018-12-19 NOTE — Patient Instructions (Signed)
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
## 2018-12-29 ENCOUNTER — Telehealth: Payer: Self-pay | Admitting: Cardiology

## 2018-12-29 MED ORDER — ATORVASTATIN CALCIUM 20 MG PO TABS: 20.0000 mg | ORAL_TABLET | Freq: Every day | ORAL | 1 refills | Status: DC

## 2018-12-29 NOTE — Telephone Encounter (Signed)
Needs Refills Atorvastatin 20 mg

## 2018-12-29 NOTE — Telephone Encounter (Signed)
Refill atorvastatin sent

## 2019-01-02 DIAGNOSIS — M79674 Pain in right toe(s): Secondary | ICD-10-CM | POA: Diagnosis not present

## 2019-01-02 DIAGNOSIS — M79675 Pain in left toe(s): Secondary | ICD-10-CM | POA: Diagnosis not present

## 2019-01-02 DIAGNOSIS — B351 Tinea unguium: Secondary | ICD-10-CM | POA: Diagnosis not present

## 2019-01-08 ENCOUNTER — Telehealth: Payer: Self-pay

## 2019-01-08 DIAGNOSIS — Z951 Presence of aortocoronary bypass graft: Secondary | ICD-10-CM

## 2019-01-08 DIAGNOSIS — E785 Hyperlipidemia, unspecified: Secondary | ICD-10-CM

## 2019-01-08 NOTE — Telephone Encounter (Signed)
Patient came in for labs today and states he did not have it drawn at his PCP. RN ordered lipid/hepatic panel based off Dr. Docia Furl office note from 12/19/18. Patient was not fasting and will come back on alternate day.

## 2019-01-22 DIAGNOSIS — Z23 Encounter for immunization: Secondary | ICD-10-CM | POA: Diagnosis not present

## 2019-01-29 DIAGNOSIS — Z951 Presence of aortocoronary bypass graft: Secondary | ICD-10-CM | POA: Diagnosis not present

## 2019-01-29 DIAGNOSIS — Z125 Encounter for screening for malignant neoplasm of prostate: Secondary | ICD-10-CM | POA: Diagnosis not present

## 2019-01-29 DIAGNOSIS — N401 Enlarged prostate with lower urinary tract symptoms: Secondary | ICD-10-CM | POA: Diagnosis not present

## 2019-01-29 DIAGNOSIS — E785 Hyperlipidemia, unspecified: Secondary | ICD-10-CM | POA: Diagnosis not present

## 2019-01-29 DIAGNOSIS — Z79899 Other long term (current) drug therapy: Secondary | ICD-10-CM | POA: Diagnosis not present

## 2019-01-29 DIAGNOSIS — R739 Hyperglycemia, unspecified: Secondary | ICD-10-CM | POA: Diagnosis not present

## 2019-01-29 LAB — HEPATIC FUNCTION PANEL
ALT: 15 IU/L (ref 0–44)
AST: 16 IU/L (ref 0–40)
Albumin: 4.5 g/dL (ref 3.6–4.6)
Alkaline Phosphatase: 100 IU/L (ref 39–117)
Bilirubin Total: 0.6 mg/dL (ref 0.0–1.2)
Bilirubin, Direct: 0.15 mg/dL (ref 0.00–0.40)
Total Protein: 7.3 g/dL (ref 6.0–8.5)

## 2019-01-29 LAB — LIPID PANEL
Chol/HDL Ratio: 2.6 ratio (ref 0.0–5.0)
Cholesterol, Total: 127 mg/dL (ref 100–199)
HDL: 48 mg/dL (ref >39)
LDL Chol Calc (NIH): 62 mg/dL (ref 0–99)
Triglycerides: 87 mg/dL (ref 0–149)
VLDL Cholesterol Cal: 17 mg/dL (ref 5–40)

## 2019-01-30 ENCOUNTER — Telehealth: Payer: Self-pay

## 2019-01-30 NOTE — Telephone Encounter (Signed)
Informed that results were good and a copy was sent to Dr. Venetia Maxon. No further questions.

## 2019-01-30 NOTE — Telephone Encounter (Signed)
-----   Message from Jenean Lindau, MD sent at 01/29/2019  4:58 PM EDT ----- The results of the study is unremarkable. Please inform patient. I will discuss in detail at next appointment. Cc  primary care/referring physician Jenean Lindau, MD 01/29/2019 4:58 PM

## 2019-02-02 DIAGNOSIS — Z0000 Encounter for general adult medical examination without abnormal findings: Secondary | ICD-10-CM | POA: Diagnosis not present

## 2019-02-02 DIAGNOSIS — G609 Hereditary and idiopathic neuropathy, unspecified: Secondary | ICD-10-CM | POA: Diagnosis not present

## 2019-02-02 DIAGNOSIS — I10 Essential (primary) hypertension: Secondary | ICD-10-CM | POA: Diagnosis not present

## 2019-02-02 DIAGNOSIS — E785 Hyperlipidemia, unspecified: Secondary | ICD-10-CM | POA: Diagnosis not present

## 2019-02-02 DIAGNOSIS — I2510 Atherosclerotic heart disease of native coronary artery without angina pectoris: Secondary | ICD-10-CM | POA: Diagnosis not present

## 2019-02-14 DIAGNOSIS — N183 Chronic kidney disease, stage 3 (moderate): Secondary | ICD-10-CM | POA: Diagnosis not present

## 2019-02-14 DIAGNOSIS — D631 Anemia in chronic kidney disease: Secondary | ICD-10-CM | POA: Diagnosis not present

## 2019-02-14 DIAGNOSIS — E785 Hyperlipidemia, unspecified: Secondary | ICD-10-CM | POA: Diagnosis not present

## 2019-02-14 DIAGNOSIS — G629 Polyneuropathy, unspecified: Secondary | ICD-10-CM | POA: Diagnosis not present

## 2019-02-14 DIAGNOSIS — Z0000 Encounter for general adult medical examination without abnormal findings: Secondary | ICD-10-CM | POA: Diagnosis not present

## 2019-02-14 DIAGNOSIS — N189 Chronic kidney disease, unspecified: Secondary | ICD-10-CM | POA: Diagnosis not present

## 2019-02-14 DIAGNOSIS — I129 Hypertensive chronic kidney disease with stage 1 through stage 4 chronic kidney disease, or unspecified chronic kidney disease: Secondary | ICD-10-CM | POA: Diagnosis not present

## 2019-02-14 DIAGNOSIS — I4891 Unspecified atrial fibrillation: Secondary | ICD-10-CM | POA: Diagnosis not present

## 2019-02-14 DIAGNOSIS — I2510 Atherosclerotic heart disease of native coronary artery without angina pectoris: Secondary | ICD-10-CM | POA: Diagnosis not present

## 2019-02-14 DIAGNOSIS — M109 Gout, unspecified: Secondary | ICD-10-CM | POA: Diagnosis not present

## 2019-02-14 DIAGNOSIS — N2581 Secondary hyperparathyroidism of renal origin: Secondary | ICD-10-CM | POA: Diagnosis not present

## 2019-02-27 DIAGNOSIS — R600 Localized edema: Secondary | ICD-10-CM | POA: Diagnosis not present

## 2019-02-27 DIAGNOSIS — Z6832 Body mass index (BMI) 32.0-32.9, adult: Secondary | ICD-10-CM | POA: Diagnosis not present

## 2019-02-27 DIAGNOSIS — M5432 Sciatica, left side: Secondary | ICD-10-CM | POA: Diagnosis not present

## 2019-02-27 DIAGNOSIS — I10 Essential (primary) hypertension: Secondary | ICD-10-CM | POA: Diagnosis not present

## 2019-03-07 DIAGNOSIS — M79675 Pain in left toe(s): Secondary | ICD-10-CM | POA: Diagnosis not present

## 2019-03-07 DIAGNOSIS — B351 Tinea unguium: Secondary | ICD-10-CM | POA: Diagnosis not present

## 2019-03-07 DIAGNOSIS — M79674 Pain in right toe(s): Secondary | ICD-10-CM | POA: Diagnosis not present

## 2019-03-26 DIAGNOSIS — E669 Obesity, unspecified: Secondary | ICD-10-CM | POA: Diagnosis not present

## 2019-03-26 DIAGNOSIS — Z6832 Body mass index (BMI) 32.0-32.9, adult: Secondary | ICD-10-CM | POA: Diagnosis not present

## 2019-03-26 DIAGNOSIS — I10 Essential (primary) hypertension: Secondary | ICD-10-CM | POA: Diagnosis not present

## 2019-03-26 DIAGNOSIS — S46212A Strain of muscle, fascia and tendon of other parts of biceps, left arm, initial encounter: Secondary | ICD-10-CM | POA: Diagnosis not present

## 2019-04-05 DIAGNOSIS — S46212A Strain of muscle, fascia and tendon of other parts of biceps, left arm, initial encounter: Secondary | ICD-10-CM

## 2019-04-05 HISTORY — DX: Strain of muscle, fascia and tendon of other parts of biceps, left arm, initial encounter: S46.212A

## 2019-04-25 DIAGNOSIS — B3789 Other sites of candidiasis: Secondary | ICD-10-CM | POA: Diagnosis not present

## 2019-04-25 DIAGNOSIS — N401 Enlarged prostate with lower urinary tract symptoms: Secondary | ICD-10-CM | POA: Diagnosis not present

## 2019-04-26 DIAGNOSIS — L821 Other seborrheic keratosis: Secondary | ICD-10-CM | POA: Diagnosis not present

## 2019-04-26 DIAGNOSIS — L578 Other skin changes due to chronic exposure to nonionizing radiation: Secondary | ICD-10-CM | POA: Diagnosis not present

## 2019-04-26 DIAGNOSIS — L570 Actinic keratosis: Secondary | ICD-10-CM | POA: Diagnosis not present

## 2019-04-26 DIAGNOSIS — C4442 Squamous cell carcinoma of skin of scalp and neck: Secondary | ICD-10-CM | POA: Diagnosis not present

## 2019-05-28 DIAGNOSIS — Z20828 Contact with and (suspected) exposure to other viral communicable diseases: Secondary | ICD-10-CM | POA: Diagnosis not present

## 2019-05-28 DIAGNOSIS — R05 Cough: Secondary | ICD-10-CM | POA: Diagnosis not present

## 2019-05-28 DIAGNOSIS — U071 COVID-19: Secondary | ICD-10-CM | POA: Diagnosis not present

## 2019-06-21 ENCOUNTER — Other Ambulatory Visit: Payer: Self-pay

## 2019-06-21 ENCOUNTER — Ambulatory Visit (INDEPENDENT_AMBULATORY_CARE_PROVIDER_SITE_OTHER): Payer: Medicare Other | Admitting: Cardiology

## 2019-06-21 ENCOUNTER — Encounter: Payer: Self-pay | Admitting: Cardiology

## 2019-06-21 VITALS — BP 148/88 | HR 74 | Ht 70.0 in | Wt 217.0 lb

## 2019-06-21 DIAGNOSIS — N289 Disorder of kidney and ureter, unspecified: Secondary | ICD-10-CM

## 2019-06-21 DIAGNOSIS — Z951 Presence of aortocoronary bypass graft: Secondary | ICD-10-CM

## 2019-06-21 DIAGNOSIS — Z1329 Encounter for screening for other suspected endocrine disorder: Secondary | ICD-10-CM | POA: Diagnosis not present

## 2019-06-21 DIAGNOSIS — I10 Essential (primary) hypertension: Secondary | ICD-10-CM

## 2019-06-21 DIAGNOSIS — I2510 Atherosclerotic heart disease of native coronary artery without angina pectoris: Secondary | ICD-10-CM

## 2019-06-21 DIAGNOSIS — E785 Hyperlipidemia, unspecified: Secondary | ICD-10-CM

## 2019-06-21 MED ORDER — NITROGLYCERIN 0.4 MG SL SUBL: 0.4000 mg | SUBLINGUAL_TABLET | SUBLINGUAL | 6 refills | Status: DC | PRN

## 2019-06-21 NOTE — Progress Notes (Signed)
Cardiology Office Note:    Date:  06/21/2019   [name], DOB 1931/12/03, MRN [id number]  PCP:  Street, Sharon Mt, MD  Cardiologist:  Jenean Lindau, MD   Referring MD: [address], Sharon Mt, *    ASSESSMENT:    1. Coronary artery disease involving native coronary artery of native heart without angina pectoris   2. Hyperlipidemia with target LDL less than 70   3. Essential hypertension   4. S/P CABG x 4   5. Renal insufficiency    PLAN:    In order of problems listed above:  1. Coronary artery disease: Secondary prevention stressed with the patient.  Importance of compliance with diet and medication stressed and he vocalized understanding.  Importance of regular exercise stressed and I told him to walk at least half an hour a day at least 5 days a week and he agrees to do so.  Weight reduction was stressed and risks of obesity explained. 2. Essential hypertension: Blood pressure stable.  He tells me that it is about 140/70 when he checks it at home on a regular basis.  I would prefer not to be more aggressive with this as he is elderly and there is risk of hypotension and falls. 3. Mixed dyslipidemia: Diet was discussed patient will have all blood work today as he is fasting. 4. Sublingual nitroglycerin prescription was sent, its protocol and 911 protocol explained and the patient vocalized understanding questions were answered to the patient's satisfaction 5. Patient will be seen in follow-up appointment in 6 months or earlier if the patient has any concerns    Medication Adjustments/Labs and Tests Ordered: Current medicines are reviewed at length with the patient today.  Concerns regarding medicines are outlined above.  No orders of the defined types were placed in this encounter.  No orders of the defined types were placed in this encounter.    Chief Complaint  Patient presents with  . Follow-up     History of Present Illness:    [name] is a 86 y.o.  male.  Patient has past medical history of coronary artery disease post CABG surgery, essential hypertension and dyslipidemia.  He denies any problems at this time and takes care of activities of daily living.  No chest pain orthopnea or PND.  He admits that he leads a sedentary lifestyle.  At the time of my evaluation, the patient is alert awake oriented and in no distress.  Past Medical History:  Diagnosis Date  . Anemia    takes Iron pill daily  . Arthritis   . Cancer ([place]) 02/14/13   skin cancer  . Cataract    right eye  and immature  . Chest pain with high risk for cardiac etiology, abnormal Myoview 12/22/2017  . Constipation   . Depression    no meds required  . Enlarged prostate    takes Flomax daily  . Gout    takes Allopurinol daily  . Hemorrhoids    occasionally  . History of blood transfusion    lap - colon blockage  . History of bronchitis    last time in Dec 2014  . History of colon polyps   . History of UTI    takes Trimethoprim daily  . Hypertension    takes Amlodipine and Avapro daily  . Impaired hearing    wears hearing aids  . Insomnia    takes Restoril nightly  . Joint pain   . Neuromuscular disorder ([place]) 02/14/13  damnage nerves in bil feet  . Neuropathy   . Peripheral neuropathy   . Renal calculus    large  . Shortness of breath    with exertion    Past Surgical History:  Procedure Laterality Date  . APPENDECTOMY  1964  . basal cell removed from back and face   2005  . basal cell removed from cheek and nose  2009  . basal cell removed from left ear  2014  . COLONOSCOPY  2010  . CORONARY ARTERY BYPASS GRAFT N/A 12/28/2017   Procedure: CORONARY ARTERY BYPASS GRAFTING (CABG) USING LEFT INTERNAL MAMMARY ARTERY TO LAD  AND ENDOSCOPICALLY HARVESTED RIGHT SAPHENOUS VEIN TO DIAGONAL, CIRC AND PDA;  Surgeon: Grace Isaac, MD;  Location: [place];  Service: Open Heart Surgery;  Laterality: N/A;  . ESOPHAGOGASTRODUODENOSCOPY    . HEMORRHOID SURGERY   1970/2006  . INTRAMEDULLARY (IM) NAIL INTERTROCHANTERIC Right 02/15/2013   Procedure: INTRAMEDULLARY (IM) NAIL INTERTROCHANTRIC;  Surgeon: Rozanna Box, MD;  Location: [place];  Service: Orthopedics;  Laterality: Right;  . JOINT REPLACEMENT Left 09   knee  . laparoscopic surgery   2006   d/t blockage in intestine   . LEFT HEART CATH AND CORONARY ANGIOGRAPHY N/A 12/23/2017   Procedure: LEFT HEART CATH AND CORONARY ANGIOGRAPHY;  Surgeon: Martinique, Peter M, MD;  Location: [place] [facility];  Service: Cardiovascular;  Laterality: N/A;  . LUMBAR LAMINECTOMY/DECOMPRESSION MICRODISCECTOMY Left 06/01/2013   Procedure: LEFT LUMBAR THREE-FOUR DECOMPRESSION LUMBAR LAMINECTOMY/MICRODISCECTOMY;POSSIBLE LUMBAR FOUR-FIVE;  Surgeon: Charlie Pitter, MD;  Location: [place] [facility];  Service: Neurosurgery;  Laterality: Left;  left  . right hip surgery  2014  . TEE WITHOUT CARDIOVERSION N/A 12/28/2017   Procedure: TRANSESOPHAGEAL ECHOCARDIOGRAM (TEE);  Surgeon: Grace Isaac, MD;  Location: [place];  Service: Open Heart Surgery;  Laterality: N/A;  . TONSILLECTOMY  1970/2006  . TOTAL KNEE ARTHROPLASTY Right 09/03/2013   Procedure: RIGHT TOTAL KNEE ARTHROPLASTY;  Surgeon: Vickey Huger, MD;  Location: [place];  Service: Orthopedics;  Laterality: Right;    Current Medications: Current Meds  Medication Sig  . acetaminophen (TYLENOL) 500 MG tablet Take 1,000 mg by mouth every 6 (six) hours as needed for pain.  Marland Kitchen allopurinol (ZYLOPRIM) 300 MG tablet Take 150 mg by mouth every morning.   Marland Kitchen amLODipine (NORVASC) 5 MG tablet Take 5 mg by mouth daily.  Marland Kitchen aspirin EC 81 MG tablet Take 1 tablet (81 mg total) by mouth daily.  Marland Kitchen atorvastatin (LIPITOR) 20 MG tablet Take 1 tablet (20 mg total) by mouth daily.  . Iron-Vitamin C 100-250 MG TABS Take 100 mg by mouth.  . Multiple Vitamin (MULTIVITAMIN WITH MINERALS) TABS tablet Take 1 tablet by mouth daily.  . sertraline (ZOLOFT) 50 MG tablet Take 50 mg by mouth as needed.   . tamsulosin  (FLOMAX) 0.4 MG CAPS capsule Take 0.4 mg by mouth daily after supper.   . temazepam (RESTORIL) 15 MG capsule Take 15 mg by mouth at bedtime as needed for sleep.  . vitamin B-12 (CYANOCOBALAMIN) 500 MCG tablet Take 500 mcg by mouth daily.  . vitamin C (ASCORBIC ACID) 500 MG tablet Take 500 mg by mouth daily.     Allergies:   Ativan [lorazepam] and Adhesive [tape]   Social History   Socioeconomic History  . Marital status: Married    Spouse name: Not on file  . Number of children: Not on file  . Years of education: Not on file  . Highest education  level: Not on file  Occupational History  . Occupation: Retired  Tobacco Use  . Smoking status: Never Smoker  . Smokeless tobacco: Never Used  Substance and Sexual Activity  . Alcohol use: No  . Drug use: No  . Sexual activity: Not Currently  Other Topics Concern  . Not on file  Social History Narrative  . Not on file   Social Determinants of Health   Financial Resource Strain:   . Difficulty of Paying Living Expenses: Not on file  Food Insecurity:   . Worried About Charity fundraiser in the Last Year: Not on file  . Ran Out of Food in the Last Year: Not on file  Transportation Needs:   . Lack of Transportation (Medical): Not on file  . Lack of Transportation (Non-Medical): Not on file  Physical Activity:   . Days of Exercise per Week: Not on file  . Minutes of Exercise per Session: Not on file  Stress:   . Feeling of Stress : Not on file  Social Connections:   . Frequency of Communication with Friends and Family: Not on file  . Frequency of Social Gatherings with Friends and Family: Not on file  . Attends Religious Services: Not on file  . Active Member of Clubs or Organizations: Not on file  . Attends Archivist Meetings: Not on file  . Marital Status: Not on file     Family History: The patient's family history includes Emphysema in his mother; Heart attack (age of onset: 18) in his father; Rheum arthritis  in his sister.  ROS:   Please see the history of present illness.    All other systems reviewed and are negative.  EKGs/Labs/Other Studies Reviewed:    The following studies were reviewed today: EKG reveals sinus rhythm and nonspecific ST-T changes   Recent Labs: 01/29/2019: ALT 15  Recent Lipid Panel    Component Value Date/Time   CHOL 127 01/29/2019 0814   TRIG 87 01/29/2019 0814   HDL 48 01/29/2019 0814   CHOLHDL 2.6 01/29/2019 0814   CHOLHDL 3.0 12/23/2017 0445   VLDL 10 12/23/2017 0445   LDLCALC 62 01/29/2019 0814    Physical Exam:    VS:  BP (!) 148/88   Pulse 74   Ht 5\' 10"  (1.778 m)   Wt 217 lb (98.4 kg)   SpO2 94%   BMI 31.14 kg/m     Wt Readings from Last 3 Encounters:  06/21/19 217 lb (98.4 kg)  12/19/18 210 lb (95.3 kg)  12/18/18 208 lb (94.3 kg)     GEN: Patient is in no acute distress HEENT: Normal NECK: No JVD; No carotid bruits LYMPHATICS: No lymphadenopathy CARDIAC: Hear sounds regular, 2/6 systolic murmur at the apex. RESPIRATORY:  Clear to auscultation without rales, wheezing or rhonchi  ABDOMEN: Soft, non-tender, non-distended MUSCULOSKELETAL:  No edema; No deformity  SKIN: Warm and dry NEUROLOGIC:  Alert and oriented x 3 PSYCHIATRIC:  Normal affect   Signed, Jenean Lindau, MD  06/21/2019 9:45 AM    [place]

## 2019-06-21 NOTE — Patient Instructions (Signed)
Medication Instructions:  Your physician has recommended you make the following change in your medication:  We have sent you a Rx for nitroglycerin.  *If you need a refill on your cardiac medications before your next appointment, please call your pharmacy*  Lab Work: You had a BMET, CBC, TSH, Lipid and LFTS. If you have labs (blood work) drawn today and your tests are completely normal, you will receive your results only by: Marland Kitchen MyChart Message (if you have MyChart) OR . A paper copy in the mail If you have any lab test that is abnormal or we need to change your treatment, we will call you to review the results.  Testing/Procedures: None ordered  Follow-Up: At [facility], you and your health needs are our priority.  As part of our continuing mission to provide you with exceptional heart care, we have created designated Provider Care Teams.  These Care Teams include your primary Cardiologist (physician) and Advanced Practice Providers (APPs -  Physician Assistants and Nurse Practitioners) who all work together to provide you with the care you need, when you need it.  Your next appointment:   6 month(s)  The format for your next appointment:   In Person  Provider:   Jyl Heinz, MD  Other Instructions NA

## 2019-06-21 NOTE — Addendum Note (Signed)
Addended by: Truddie Hidden on: 06/21/2019 10:33 AM   Modules accepted: Orders

## 2019-06-22 LAB — LIPID PANEL
Chol/HDL Ratio: 2.6 ratio (ref 0.0–5.0)
Cholesterol, Total: 120 mg/dL (ref 100–199)
HDL: 47 mg/dL (ref >39)
LDL Chol Calc (NIH): 54 mg/dL (ref 0–99)
Triglycerides: 100 mg/dL (ref 0–149)
VLDL Cholesterol Cal: 19 mg/dL (ref 5–40)

## 2019-06-22 LAB — HEPATIC FUNCTION PANEL
ALT: 11 IU/L (ref 0–44)
AST: 15 IU/L (ref 0–40)
Albumin: 4.2 g/dL (ref 3.6–4.6)
Alkaline Phosphatase: 92 IU/L (ref 39–117)
Bilirubin Total: 0.9 mg/dL (ref 0.0–1.2)
Bilirubin, Direct: 0.25 mg/dL (ref 0.00–0.40)
Total Protein: 7 g/dL (ref 6.0–8.5)

## 2019-06-22 LAB — TSH: TSH: 4.08 u[IU]/mL (ref 0.450–4.500)

## 2019-06-22 LAB — CBC WITH DIFFERENTIAL/PLATELET
Basophils Absolute: 0 10*3/uL (ref 0.0–0.2)
Basos: 0 %
EOS (ABSOLUTE): 0.1 10*3/uL (ref 0.0–0.4)
Eos: 1 %
Hematocrit: 41.8 % (ref 37.5–51.0)
Hemoglobin: 14.4 g/dL (ref 13.0–17.7)
Immature Grans (Abs): 0 10*3/uL (ref 0.0–0.1)
Immature Granulocytes: 0 %
Lymphocytes Absolute: 1.7 10*3/uL (ref 0.7–3.1)
Lymphs: 18 %
MCH: 33 pg (ref 26.6–33.0)
MCHC: 34.4 g/dL (ref 31.5–35.7)
MCV: 96 fL (ref 79–97)
Monocytes Absolute: 0.8 10*3/uL (ref 0.1–0.9)
Monocytes: 8 %
Neutrophils Absolute: 6.9 10*3/uL (ref 1.4–7.0)
Neutrophils: 73 %
Platelets: 118 10*3/uL — ABNORMAL LOW (ref 150–450)
RBC: 4.37 x10E6/uL (ref 4.14–5.80)
RDW: 13.6 % (ref 11.6–15.4)
WBC: 9.5 10*3/uL (ref 3.4–10.8)

## 2019-06-22 LAB — BASIC METABOLIC PANEL
BUN/Creatinine Ratio: 9 — ABNORMAL LOW (ref 10–24)
BUN: 15 mg/dL (ref 8–27)
CO2: 20 mmol/L (ref 20–29)
Calcium: 8.9 mg/dL (ref 8.6–10.2)
Chloride: 102 mmol/L (ref 96–106)
Creatinine, Ser: 1.68 mg/dL — ABNORMAL HIGH (ref 0.76–1.27)
GFR calc Af Amer: 42 mL/min/{1.73_m2} — ABNORMAL LOW (ref >59)
GFR calc non Af Amer: 36 mL/min/{1.73_m2} — ABNORMAL LOW (ref >59)
Glucose: 91 mg/dL (ref 65–99)
Potassium: 4.6 mmol/L (ref 3.5–5.2)
Sodium: 138 mmol/L (ref 134–144)

## 2019-07-09 ENCOUNTER — Other Ambulatory Visit: Payer: Self-pay

## 2019-07-09 MED ORDER — ATORVASTATIN CALCIUM 20 MG PO TABS: 20.0000 mg | ORAL_TABLET | Freq: Every day | ORAL | 3 refills | Status: DC

## 2019-07-11 DIAGNOSIS — I700 Atherosclerosis of aorta: Secondary | ICD-10-CM | POA: Diagnosis not present

## 2019-07-11 DIAGNOSIS — M549 Dorsalgia, unspecified: Secondary | ICD-10-CM | POA: Diagnosis not present

## 2019-07-11 DIAGNOSIS — I714 Abdominal aortic aneurysm, without rupture: Secondary | ICD-10-CM | POA: Diagnosis not present

## 2019-07-11 DIAGNOSIS — M545 Low back pain: Secondary | ICD-10-CM | POA: Diagnosis not present

## 2019-07-23 DIAGNOSIS — M79674 Pain in right toe(s): Secondary | ICD-10-CM | POA: Diagnosis not present

## 2019-07-23 DIAGNOSIS — M79675 Pain in left toe(s): Secondary | ICD-10-CM | POA: Diagnosis not present

## 2019-07-23 DIAGNOSIS — B351 Tinea unguium: Secondary | ICD-10-CM | POA: Diagnosis not present

## 2019-07-24 DIAGNOSIS — R351 Nocturia: Secondary | ICD-10-CM | POA: Diagnosis not present

## 2019-07-24 DIAGNOSIS — N401 Enlarged prostate with lower urinary tract symptoms: Secondary | ICD-10-CM | POA: Diagnosis not present

## 2019-08-28 DIAGNOSIS — D631 Anemia in chronic kidney disease: Secondary | ICD-10-CM | POA: Diagnosis not present

## 2019-08-28 DIAGNOSIS — N189 Chronic kidney disease, unspecified: Secondary | ICD-10-CM | POA: Diagnosis not present

## 2019-08-28 DIAGNOSIS — I2510 Atherosclerotic heart disease of native coronary artery without angina pectoris: Secondary | ICD-10-CM | POA: Diagnosis not present

## 2019-08-28 DIAGNOSIS — E785 Hyperlipidemia, unspecified: Secondary | ICD-10-CM | POA: Diagnosis not present

## 2019-08-28 DIAGNOSIS — I4891 Unspecified atrial fibrillation: Secondary | ICD-10-CM | POA: Diagnosis not present

## 2019-08-28 DIAGNOSIS — N183 Chronic kidney disease, stage 3 unspecified: Secondary | ICD-10-CM | POA: Diagnosis not present

## 2019-08-28 DIAGNOSIS — N2581 Secondary hyperparathyroidism of renal origin: Secondary | ICD-10-CM | POA: Diagnosis not present

## 2019-08-28 DIAGNOSIS — M109 Gout, unspecified: Secondary | ICD-10-CM | POA: Diagnosis not present

## 2019-08-28 DIAGNOSIS — I129 Hypertensive chronic kidney disease with stage 1 through stage 4 chronic kidney disease, or unspecified chronic kidney disease: Secondary | ICD-10-CM | POA: Diagnosis not present

## 2019-08-28 DIAGNOSIS — M71061 Abscess of bursa, right knee: Secondary | ICD-10-CM | POA: Diagnosis not present

## 2019-08-28 DIAGNOSIS — L03115 Cellulitis of right lower limb: Secondary | ICD-10-CM | POA: Diagnosis not present

## 2019-09-10 DIAGNOSIS — M62838 Other muscle spasm: Secondary | ICD-10-CM | POA: Diagnosis not present

## 2019-09-10 DIAGNOSIS — G609 Hereditary and idiopathic neuropathy, unspecified: Secondary | ICD-10-CM | POA: Diagnosis not present

## 2019-09-10 DIAGNOSIS — S8392XA Sprain of unspecified site of left knee, initial encounter: Secondary | ICD-10-CM | POA: Diagnosis not present

## 2019-09-10 DIAGNOSIS — S93402A Sprain of unspecified ligament of left ankle, initial encounter: Secondary | ICD-10-CM | POA: Diagnosis not present

## 2019-09-24 DIAGNOSIS — M79675 Pain in left toe(s): Secondary | ICD-10-CM | POA: Diagnosis not present

## 2019-09-24 DIAGNOSIS — B351 Tinea unguium: Secondary | ICD-10-CM | POA: Diagnosis not present

## 2019-09-24 DIAGNOSIS — M79674 Pain in right toe(s): Secondary | ICD-10-CM | POA: Diagnosis not present

## 2019-09-26 DIAGNOSIS — H2513 Age-related nuclear cataract, bilateral: Secondary | ICD-10-CM | POA: Diagnosis not present

## 2019-10-29 ENCOUNTER — Other Ambulatory Visit: Payer: Self-pay | Admitting: Cardiology

## 2019-10-29 DIAGNOSIS — L570 Actinic keratosis: Secondary | ICD-10-CM | POA: Diagnosis not present

## 2019-10-29 DIAGNOSIS — C4441 Basal cell carcinoma of skin of scalp and neck: Secondary | ICD-10-CM | POA: Diagnosis not present

## 2019-10-29 DIAGNOSIS — C44311 Basal cell carcinoma of skin of nose: Secondary | ICD-10-CM | POA: Diagnosis not present

## 2019-10-29 DIAGNOSIS — C44719 Basal cell carcinoma of skin of left lower limb, including hip: Secondary | ICD-10-CM | POA: Diagnosis not present

## 2019-11-14 DIAGNOSIS — N183 Chronic kidney disease, stage 3 unspecified: Secondary | ICD-10-CM | POA: Diagnosis not present

## 2019-11-14 DIAGNOSIS — F331 Major depressive disorder, recurrent, moderate: Secondary | ICD-10-CM | POA: Diagnosis not present

## 2019-11-14 DIAGNOSIS — I10 Essential (primary) hypertension: Secondary | ICD-10-CM | POA: Diagnosis not present

## 2019-11-14 DIAGNOSIS — M109 Gout, unspecified: Secondary | ICD-10-CM | POA: Diagnosis not present

## 2019-11-29 DIAGNOSIS — C44311 Basal cell carcinoma of skin of nose: Secondary | ICD-10-CM | POA: Diagnosis not present

## 2019-12-15 DIAGNOSIS — N183 Chronic kidney disease, stage 3 unspecified: Secondary | ICD-10-CM | POA: Diagnosis not present

## 2019-12-15 DIAGNOSIS — M109 Gout, unspecified: Secondary | ICD-10-CM | POA: Diagnosis not present

## 2019-12-15 DIAGNOSIS — F331 Major depressive disorder, recurrent, moderate: Secondary | ICD-10-CM | POA: Diagnosis not present

## 2019-12-20 ENCOUNTER — Other Ambulatory Visit: Payer: Self-pay | Admitting: Cardiology

## 2020-01-15 DIAGNOSIS — E669 Obesity, unspecified: Secondary | ICD-10-CM | POA: Diagnosis not present

## 2020-01-15 DIAGNOSIS — N183 Chronic kidney disease, stage 3 unspecified: Secondary | ICD-10-CM | POA: Diagnosis not present

## 2020-01-15 DIAGNOSIS — Z6831 Body mass index (BMI) 31.0-31.9, adult: Secondary | ICD-10-CM | POA: Diagnosis not present

## 2020-01-15 DIAGNOSIS — F331 Major depressive disorder, recurrent, moderate: Secondary | ICD-10-CM | POA: Diagnosis not present

## 2020-01-15 DIAGNOSIS — G609 Hereditary and idiopathic neuropathy, unspecified: Secondary | ICD-10-CM | POA: Diagnosis not present

## 2020-01-15 DIAGNOSIS — M62838 Other muscle spasm: Secondary | ICD-10-CM | POA: Diagnosis not present

## 2020-02-02 DIAGNOSIS — Z23 Encounter for immunization: Secondary | ICD-10-CM | POA: Diagnosis not present

## 2020-02-14 DIAGNOSIS — B351 Tinea unguium: Secondary | ICD-10-CM | POA: Diagnosis not present

## 2020-02-14 DIAGNOSIS — M79674 Pain in right toe(s): Secondary | ICD-10-CM | POA: Diagnosis not present

## 2020-02-14 DIAGNOSIS — M79675 Pain in left toe(s): Secondary | ICD-10-CM | POA: Diagnosis not present

## 2020-02-18 DIAGNOSIS — I129 Hypertensive chronic kidney disease with stage 1 through stage 4 chronic kidney disease, or unspecified chronic kidney disease: Secondary | ICD-10-CM | POA: Diagnosis not present

## 2020-02-18 DIAGNOSIS — E785 Hyperlipidemia, unspecified: Secondary | ICD-10-CM | POA: Diagnosis not present

## 2020-02-18 DIAGNOSIS — N183 Chronic kidney disease, stage 3 unspecified: Secondary | ICD-10-CM | POA: Diagnosis not present

## 2020-02-18 DIAGNOSIS — N189 Chronic kidney disease, unspecified: Secondary | ICD-10-CM | POA: Diagnosis not present

## 2020-02-18 DIAGNOSIS — I4891 Unspecified atrial fibrillation: Secondary | ICD-10-CM | POA: Diagnosis not present

## 2020-02-18 DIAGNOSIS — M109 Gout, unspecified: Secondary | ICD-10-CM | POA: Diagnosis not present

## 2020-02-18 DIAGNOSIS — N2581 Secondary hyperparathyroidism of renal origin: Secondary | ICD-10-CM | POA: Diagnosis not present

## 2020-02-18 DIAGNOSIS — D631 Anemia in chronic kidney disease: Secondary | ICD-10-CM | POA: Diagnosis not present

## 2020-02-29 DIAGNOSIS — L570 Actinic keratosis: Secondary | ICD-10-CM | POA: Diagnosis not present

## 2020-02-29 DIAGNOSIS — L578 Other skin changes due to chronic exposure to nonionizing radiation: Secondary | ICD-10-CM | POA: Diagnosis not present

## 2020-02-29 DIAGNOSIS — C44311 Basal cell carcinoma of skin of nose: Secondary | ICD-10-CM | POA: Diagnosis not present

## 2020-02-29 DIAGNOSIS — L821 Other seborrheic keratosis: Secondary | ICD-10-CM | POA: Diagnosis not present

## 2020-03-04 DIAGNOSIS — E785 Hyperlipidemia, unspecified: Secondary | ICD-10-CM | POA: Diagnosis not present

## 2020-03-04 DIAGNOSIS — Z0000 Encounter for general adult medical examination without abnormal findings: Secondary | ICD-10-CM | POA: Diagnosis not present

## 2020-03-04 DIAGNOSIS — I1310 Hypertensive heart and chronic kidney disease without heart failure, with stage 1 through stage 4 chronic kidney disease, or unspecified chronic kidney disease: Secondary | ICD-10-CM | POA: Diagnosis not present

## 2020-03-04 DIAGNOSIS — N1832 Chronic kidney disease, stage 3b: Secondary | ICD-10-CM | POA: Diagnosis not present

## 2020-03-04 DIAGNOSIS — R7302 Impaired glucose tolerance (oral): Secondary | ICD-10-CM | POA: Diagnosis not present

## 2020-03-04 DIAGNOSIS — N2581 Secondary hyperparathyroidism of renal origin: Secondary | ICD-10-CM | POA: Diagnosis not present

## 2020-03-04 DIAGNOSIS — Z79899 Other long term (current) drug therapy: Secondary | ICD-10-CM | POA: Diagnosis not present

## 2020-03-20 ENCOUNTER — Other Ambulatory Visit: Payer: Self-pay | Admitting: Cardiology

## 2020-03-20 DIAGNOSIS — Z23 Encounter for immunization: Secondary | ICD-10-CM | POA: Diagnosis not present

## 2020-04-17 DIAGNOSIS — M25774 Osteophyte, right foot: Secondary | ICD-10-CM | POA: Diagnosis not present

## 2020-05-27 DIAGNOSIS — R351 Nocturia: Secondary | ICD-10-CM | POA: Diagnosis not present

## 2020-05-27 DIAGNOSIS — N401 Enlarged prostate with lower urinary tract symptoms: Secondary | ICD-10-CM | POA: Diagnosis not present

## 2020-06-16 ENCOUNTER — Other Ambulatory Visit: Payer: Self-pay | Admitting: Cardiology

## 2020-06-16 NOTE — Telephone Encounter (Signed)
Rx refill sent to pharmacy. Message to arrange appt for med management in Feb.

## 2020-06-19 DIAGNOSIS — B351 Tinea unguium: Secondary | ICD-10-CM | POA: Diagnosis not present

## 2020-06-19 DIAGNOSIS — M79675 Pain in left toe(s): Secondary | ICD-10-CM | POA: Diagnosis not present

## 2020-06-19 DIAGNOSIS — M79674 Pain in right toe(s): Secondary | ICD-10-CM | POA: Diagnosis not present

## 2020-07-15 IMAGING — DX DG CHEST 1V PORT
1 series · 1 of 1 positions shown · non-contrast
Comparison: Yesterday.

CLINICAL DATA: Status post CABG. Chest tube remains in place.

EXAM:
PORTABLE CHEST 1 VIEW

[chest ap]
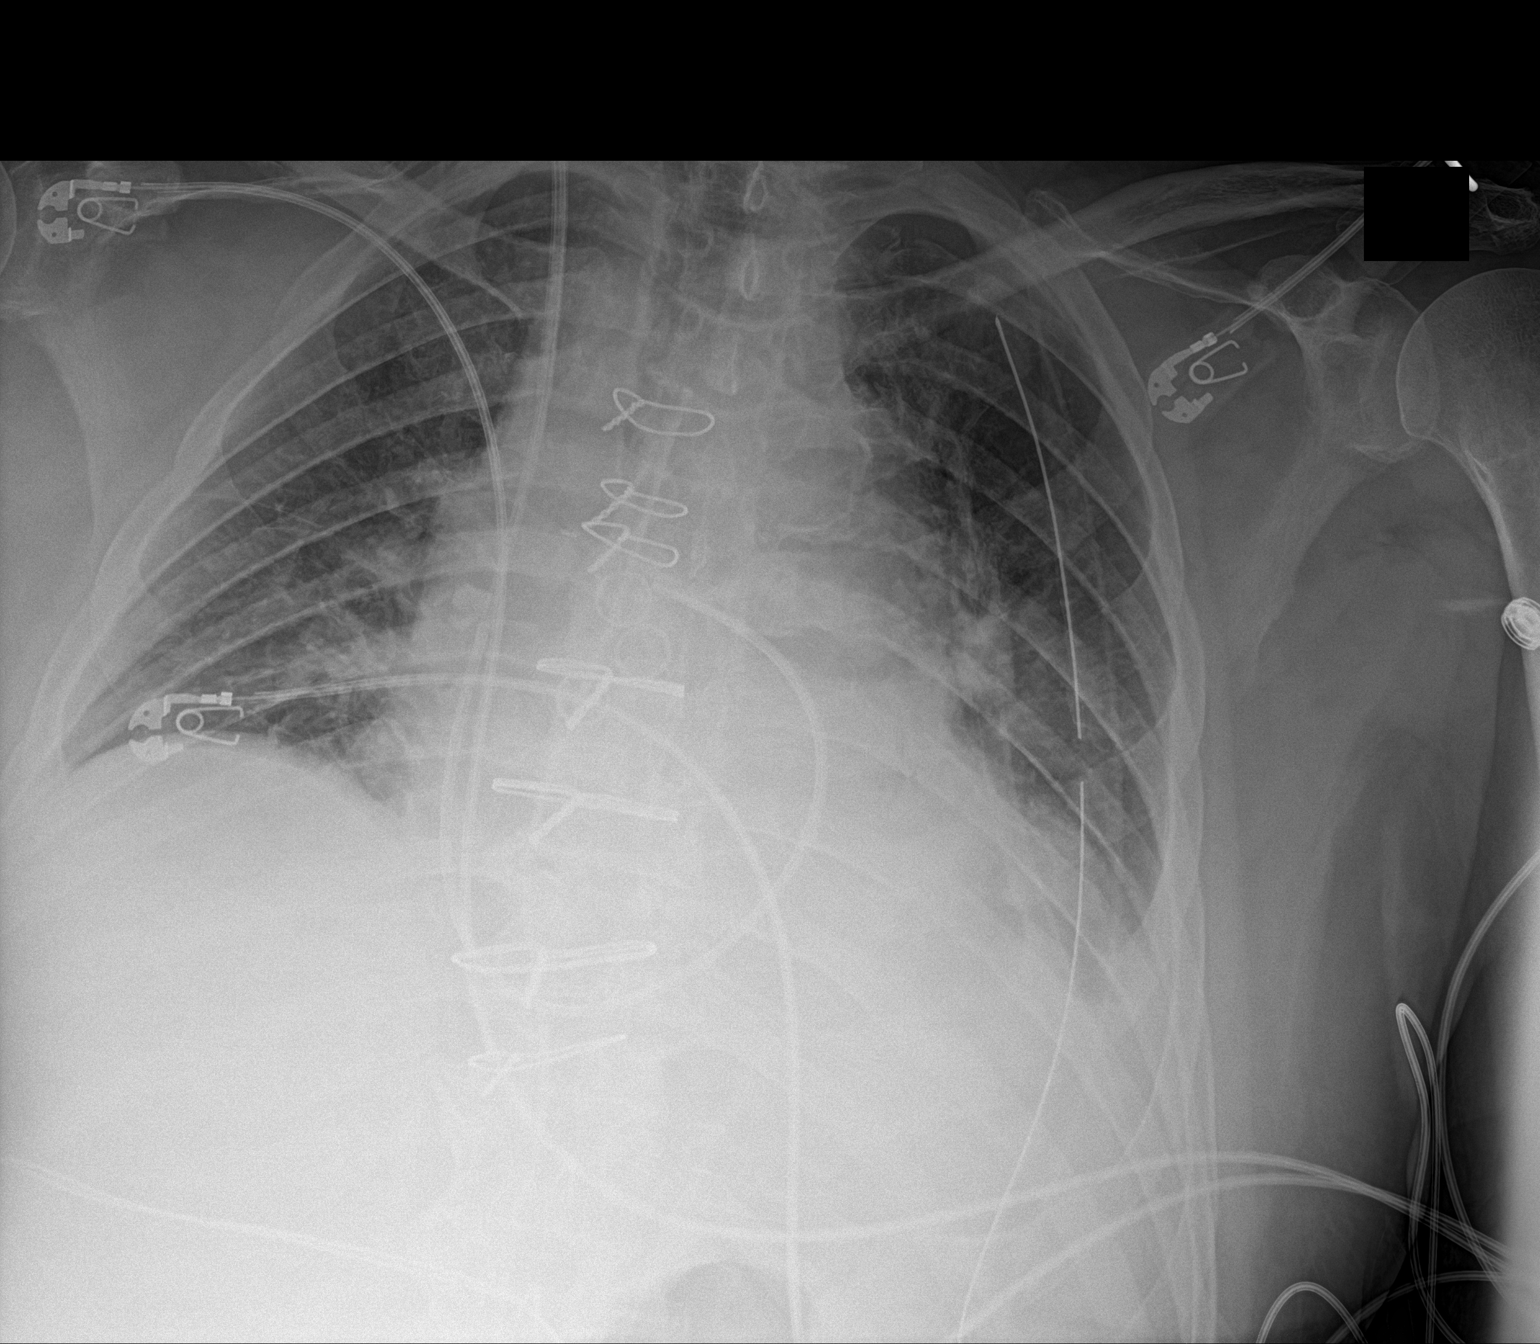

[1 of 1 positions shown; findings below may reference images not displayed]

FINDINGS: The endotracheal and nasogastric tubes have been removed.
Mediastinal and left chest tubes remain in place without
pneumothorax. The right jugular Swan-Ganz catheter tip is in the
right main pulmonary artery. Mildly increased patchy opacity in the
left lower lobe and interval minimal right basilar atelectasis.
Possible small left pleural effusion. Stable enlarged cardiac
silhouette and post CABG changes. Lower thoracic spine degenerative
changes.
IMPRESSION: 1. Increased left lower lobe atelectasis or pneumonia.
2. Interval mild right basilar atelectasis.
3. Possible small left pleural effusion.
4. No pneumothorax.

## 2020-07-17 IMAGING — DX DG CHEST 1V PORT
1 series · 1 of 1 positions shown · non-contrast
Comparison: 12/30/2016

CLINICAL DATA: Pleural effusion.

EXAM:
PORTABLE CHEST 1 VIEW

[chest ap]
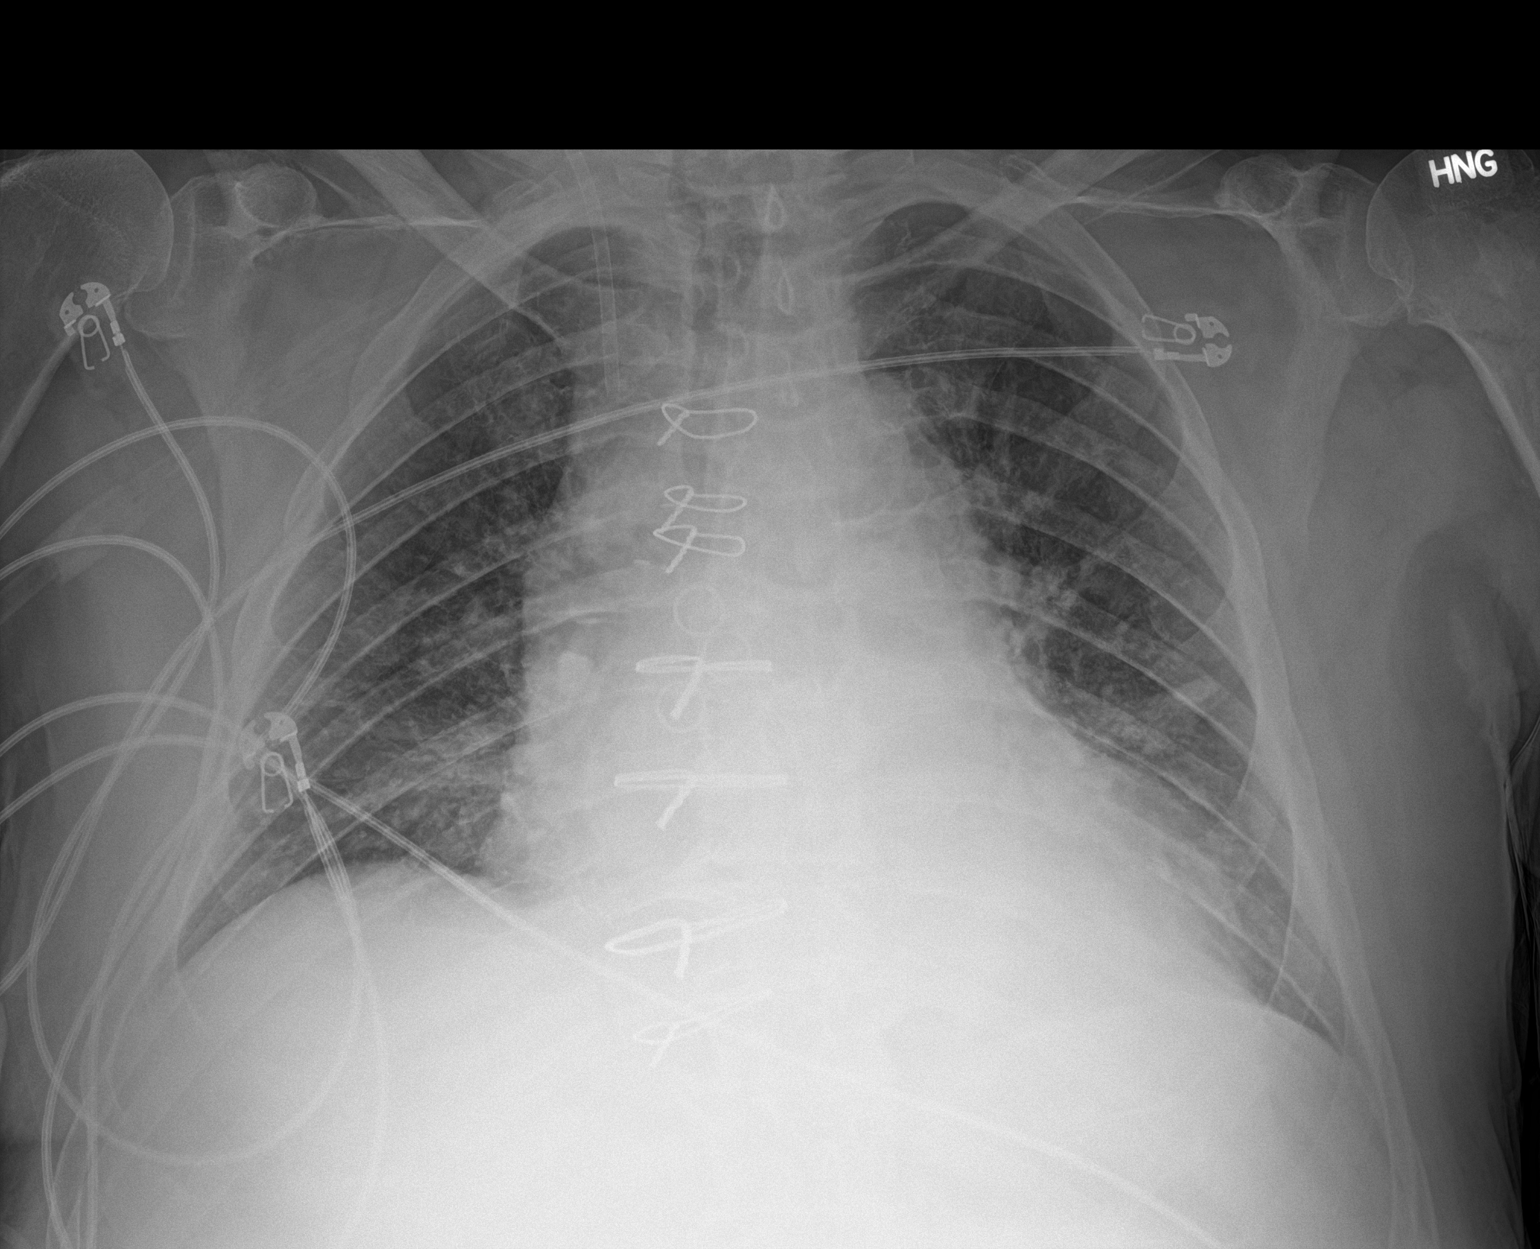

[1 of 1 positions shown; findings below may reference images not displayed]

FINDINGS: Sternotomy wires unchanged. Right IJ central venous sheath unchanged
with tip over the SVC. Lungs are adequately inflated with minimal
left retrocardiac opacification likely atelectasis. No definite
effusion. Stable cardiomegaly. Remainder the exam is unchanged.
IMPRESSION: Minimal left retrocardiac opacification likely atelectasis.

Stable cardiomegaly.

Right IJ central venous sheath unchanged.

## 2020-08-21 DIAGNOSIS — M25774 Osteophyte, right foot: Secondary | ICD-10-CM | POA: Diagnosis not present

## 2020-08-23 DIAGNOSIS — Z23 Encounter for immunization: Secondary | ICD-10-CM | POA: Diagnosis not present

## 2020-09-19 ENCOUNTER — Other Ambulatory Visit: Payer: Self-pay | Admitting: Cardiology

## 2020-09-25 DIAGNOSIS — M25774 Osteophyte, right foot: Secondary | ICD-10-CM | POA: Diagnosis not present

## 2020-09-30 DIAGNOSIS — H2513 Age-related nuclear cataract, bilateral: Secondary | ICD-10-CM | POA: Diagnosis not present

## 2020-10-27 ENCOUNTER — Other Ambulatory Visit: Payer: Self-pay

## 2020-10-27 ENCOUNTER — Ambulatory Visit (INDEPENDENT_AMBULATORY_CARE_PROVIDER_SITE_OTHER): Payer: Medicare Other | Admitting: Cardiology

## 2020-10-27 ENCOUNTER — Telehealth: Payer: Self-pay | Admitting: Cardiology

## 2020-10-27 VITALS — BP 146/80 | HR 78 | Ht 68.0 in | Wt 205.2 lb

## 2020-10-27 DIAGNOSIS — N400 Enlarged prostate without lower urinary tract symptoms: Secondary | ICD-10-CM | POA: Insufficient documentation

## 2020-10-27 DIAGNOSIS — N289 Disorder of kidney and ureter, unspecified: Secondary | ICD-10-CM | POA: Diagnosis not present

## 2020-10-27 DIAGNOSIS — I10 Essential (primary) hypertension: Secondary | ICD-10-CM | POA: Diagnosis not present

## 2020-10-27 DIAGNOSIS — H269 Unspecified cataract: Secondary | ICD-10-CM | POA: Insufficient documentation

## 2020-10-27 DIAGNOSIS — K5900 Constipation, unspecified: Secondary | ICD-10-CM | POA: Insufficient documentation

## 2020-10-27 DIAGNOSIS — D649 Anemia, unspecified: Secondary | ICD-10-CM | POA: Insufficient documentation

## 2020-10-27 DIAGNOSIS — M2550 Pain in unspecified joint: Secondary | ICD-10-CM | POA: Insufficient documentation

## 2020-10-27 DIAGNOSIS — M1990 Unspecified osteoarthritis, unspecified site: Secondary | ICD-10-CM | POA: Insufficient documentation

## 2020-10-27 DIAGNOSIS — I2510 Atherosclerotic heart disease of native coronary artery without angina pectoris: Secondary | ICD-10-CM | POA: Diagnosis not present

## 2020-10-27 DIAGNOSIS — I1310 Hypertensive heart and chronic kidney disease without heart failure, with stage 1 through stage 4 chronic kidney disease, or unspecified chronic kidney disease: Secondary | ICD-10-CM | POA: Diagnosis not present

## 2020-10-27 DIAGNOSIS — E785 Hyperlipidemia, unspecified: Secondary | ICD-10-CM

## 2020-10-27 DIAGNOSIS — Z951 Presence of aortocoronary bypass graft: Secondary | ICD-10-CM | POA: Diagnosis not present

## 2020-10-27 DIAGNOSIS — R0602 Shortness of breath: Secondary | ICD-10-CM | POA: Insufficient documentation

## 2020-10-27 DIAGNOSIS — K649 Unspecified hemorrhoids: Secondary | ICD-10-CM | POA: Insufficient documentation

## 2020-10-27 DIAGNOSIS — H9190 Unspecified hearing loss, unspecified ear: Secondary | ICD-10-CM | POA: Insufficient documentation

## 2020-10-27 DIAGNOSIS — M109 Gout, unspecified: Secondary | ICD-10-CM | POA: Insufficient documentation

## 2020-10-27 DIAGNOSIS — Z8709 Personal history of other diseases of the respiratory system: Secondary | ICD-10-CM | POA: Insufficient documentation

## 2020-10-27 DIAGNOSIS — N200 Calculus of kidney: Secondary | ICD-10-CM | POA: Insufficient documentation

## 2020-10-27 DIAGNOSIS — Z86010 Personal history of colonic polyps: Secondary | ICD-10-CM | POA: Insufficient documentation

## 2020-10-27 DIAGNOSIS — F32A Depression, unspecified: Secondary | ICD-10-CM | POA: Insufficient documentation

## 2020-10-27 DIAGNOSIS — Z9289 Personal history of other medical treatment: Secondary | ICD-10-CM | POA: Insufficient documentation

## 2020-10-27 DIAGNOSIS — Z87440 Personal history of urinary (tract) infections: Secondary | ICD-10-CM | POA: Insufficient documentation

## 2020-10-27 DIAGNOSIS — G4700 Insomnia, unspecified: Secondary | ICD-10-CM | POA: Diagnosis not present

## 2020-10-27 DIAGNOSIS — N1832 Chronic kidney disease, stage 3b: Secondary | ICD-10-CM | POA: Diagnosis not present

## 2020-10-27 MED ORDER — ASPIRIN EC 81 MG PO TBEC: 81.0000 mg | DELAYED_RELEASE_TABLET | Freq: Every day | ORAL | 3 refills | Status: DC

## 2020-10-27 MED ORDER — ATORVASTATIN CALCIUM 20 MG PO TABS: 20.0000 mg | ORAL_TABLET | Freq: Every day | ORAL | 3 refills | Status: DC

## 2020-10-27 MED ORDER — NITROGLYCERIN 0.4 MG SL SUBL: 0.4000 mg | SUBLINGUAL_TABLET | SUBLINGUAL | 6 refills | Status: DC | PRN

## 2020-10-27 NOTE — Progress Notes (Signed)
Cardiology Office Note:    Date:  10/27/2020   [name], DOB [birth year], MRN [id number]  PCP:  Street, Sharon Mt, MD  Cardiologist:  Jenean Lindau, MD   Referring MD: [address] Sharon Mt, *    ASSESSMENT:    1. Coronary artery disease involving native coronary artery of native heart without angina pectoris   2. Hyperlipidemia with target LDL less than 70   3. S/P CABG x 4   4. Renal insufficiency    PLAN:    In order of problems listed above:  Coronary artery disease: Secondary prevention stressed with the patient.  Importance of compliance with diet medication stressed any vocalized understanding.  He tells me that he walks on a regular basis. Essential hypertension: Blood pressure stable and diet was emphasized.  He keeps a close track of his blood pressures and he tells me that they are fine.  He has an element of whitecoat hypertension.  Lifestyle modification was urged. Mixed dyslipidemia: Diet emphasized and lipids will be checked today.  We will advise him accordingly. Renal insufficiency: Stable at this time.  Today's blood work will help assess this factor of his treatment also.  We will send a copy of this to his primary care physician for review. Patient will be seen in follow-up appointment in 6 months or earlier if the patient has any concerns.  He was advised to take a coated baby aspirin on a daily basis.  Sublingual nitroglycerin prescription was sent, its protocol and 911 protocol explained and the patient vocalized understanding questions were answered to the patient's satisfaction   Medication Adjustments/Labs and Tests Ordered: Current medicines are reviewed at length with the patient today.  Concerns regarding medicines are outlined above.  No orders of the defined types were placed in this encounter.  No orders of the defined types were placed in this encounter.    No chief complaint on file.    History of Present Illness:    [name] is a 87 y.o. male.  Patient has past medical history of coronary artery disease post CABG surgery, essential hypertension and dyslipidemia.  He has renal insufficiency.  He came in today to the clinic needing refills on his prescriptions.  He has not been seen here for an extended period of time therefore we wanted him to be evaluated and became as a walk-in patient.  He denies any chest pain orthopnea or PND.  He tells me that he is very active and takes care of activities of daily living also.  At the time of my evaluation, the patient is alert awake oriented and in no distress.  Past Medical History:  Diagnosis Date   Acute pain of right shoulder 02/24/2015   Acute renal failure with acute tubular necrosis superimposed on stage 3 chronic kidney disease (HCC)    Anemia    takes Iron pill daily   Arthritis    Cancer ([place]) 02/14/13   skin cancer   Cataract    right eye  and immature   Chest pain with high risk for cardiac etiology, abnormal Myoview 12/22/2017   Closed displaced fracture of neck of left fifth metacarpal bone 02/24/2015   Constipation    Coronary artery disease 12/28/2017   Depression    no meds required   Displaced intertrochanteric fracture of right femur ([place]) 02/14/2013   Enlarged prostate    takes Flomax daily   Gout    takes Allopurinol daily   Hemorrhoids  occasionally   History of blood transfusion    lap - colon blockage   History of bronchitis    last time in Dec 2014   History of colon polyps    History of UTI    takes Trimethoprim daily   Hyperlipidemia with target LDL less than 70    Hypertension    takes Amlodipine and Avapro daily   Ileus ([place]) 02/16/2013   Impaired hearing    wears hearing aids   Insomnia    takes Restoril nightly   Joint pain    Lumbar stenosis with neurogenic claudication 06/01/2013   Neuromuscular disorder ([place]) 02/14/13   damnage nerves in bil feet   Neuropathy    Pain of left hand 02/24/2015   Peripheral neuropathy     Presence of artificial knee joint 08/17/2012   Renal calculus    large   Renal insufficiency 01/31/2018   Rupture of left proximal biceps tendon 04/05/2019   S/P CABG x 4 12/28/2017   LIMA to LAD SVG to DIAGONAL SVG to CIRCUMFLEX SVG to PDA   S/P total knee arthroplasty 09/03/2013   Shortness of breath    with exertion   Spinal stenosis of lumbar region 02/14/2013   Spinal stenosis, lumbar region, with neurogenic claudication 06/01/2013   Trigger middle finger of left hand 03/18/2016   Urinary retention 02/20/2013    Past Surgical History:  Procedure Laterality Date   APPENDECTOMY  1964   basal cell removed from back and face   2005   basal cell removed from cheek and nose  2009   basal cell removed from left ear  2014   COLONOSCOPY  2010   CORONARY ARTERY BYPASS GRAFT N/A 12/28/2017   Procedure: CORONARY ARTERY BYPASS GRAFTING (CABG) USING LEFT INTERNAL MAMMARY ARTERY TO LAD  AND ENDOSCOPICALLY HARVESTED RIGHT SAPHENOUS VEIN TO DIAGONAL, CIRC AND PDA;  Surgeon: Grace Isaac, MD;  Location: [facility];  Service: Open Heart Surgery;  Laterality: N/A;   ESOPHAGOGASTRODUODENOSCOPY     HEMORRHOID SURGERY  1970/2006   INTRAMEDULLARY (IM) NAIL INTERTROCHANTERIC Right 02/15/2013   Procedure: INTRAMEDULLARY (IM) NAIL INTERTROCHANTRIC;  Surgeon: Rozanna Box, MD;  Location: [facility];  Service: Orthopedics;  Laterality: Right;   JOINT REPLACEMENT Left 09   knee   laparoscopic surgery   2006   d/t blockage in intestine    LEFT HEART CATH AND CORONARY ANGIOGRAPHY N/A 12/23/2017   Procedure: LEFT HEART CATH AND CORONARY ANGIOGRAPHY;  Surgeon: Martinique, Peter M, MD;  Location: [facility];  Service: Cardiovascular;  Laterality: N/A;   LUMBAR LAMINECTOMY/DECOMPRESSION MICRODISCECTOMY Left 06/01/2013   Procedure: LEFT LUMBAR THREE-FOUR DECOMPRESSION LUMBAR LAMINECTOMY/MICRODISCECTOMY;POSSIBLE LUMBAR FOUR-FIVE;  Surgeon: Charlie Pitter, MD;  Location: [facility];  Service: Neurosurgery;  Laterality:  Left;  left   right hip surgery  2014   TEE WITHOUT CARDIOVERSION N/A 12/28/2017   Procedure: TRANSESOPHAGEAL ECHOCARDIOGRAM (TEE);  Surgeon: Grace Isaac, MD;  Location: [facility];  Service: Open Heart Surgery;  Laterality: N/A;   TONSILLECTOMY  1970/2006   TOTAL KNEE ARTHROPLASTY Right 09/03/2013   Procedure: RIGHT TOTAL KNEE ARTHROPLASTY;  Surgeon: Vickey Huger, MD;  Location: [facility];  Service: Orthopedics;  Laterality: Right;    Current Medications: Current Meds  Medication Sig   acetaminophen (TYLENOL) 500 MG tablet Take 1,000 mg by mouth every 6 (six) hours as needed for pain.   allopurinol (ZYLOPRIM) 300 MG tablet Take 150 mg by mouth every morning.    amLODipine (NORVASC)  5 MG tablet Take 5 mg by mouth daily.   irbesartan (AVAPRO) 150 MG tablet Take 150 mg by mouth every morning.   Iron-Vitamin C 100-250 MG TABS Take 100 mg by mouth daily at 12 noon.   Multiple Vitamin (MULTIVITAMIN WITH MINERALS) TABS tablet Take 1 tablet by mouth daily.   nitroGLYCERIN (NITROSTAT) 0.4 MG SL tablet Place 0.4 mg under the tongue every 5 (five) minutes as needed for chest pain.   sertraline (ZOLOFT) 50 MG tablet Take 50 mg by mouth as needed (depression).   tamsulosin (FLOMAX) 0.4 MG CAPS capsule Take 0.4 mg by mouth daily after supper.    temazepam (RESTORIL) 15 MG capsule Take 15 mg by mouth at bedtime as needed for sleep.   tiZANidine (ZANAFLEX) 4 MG tablet Take 4 mg by mouth 2 (two) times daily as needed for muscle spasms.   vitamin B-12 (CYANOCOBALAMIN) 500 MCG tablet Take 500 mcg by mouth daily.   vitamin C (ASCORBIC ACID) 500 MG tablet Take 500 mg by mouth daily.   [DISCONTINUED] atorvastatin (LIPITOR) 20 MG tablet Take 1 tablet (20 mg total) by mouth daily. Patient needs appointment for further refills. 1 st attempt     Allergies:   Ativan [lorazepam] and Adhesive [tape]   Social History   Socioeconomic History   Marital status: Married    Spouse name: Not on file   Number of children:  Not on file   Years of education: Not on file   Highest education level: Not on file  Occupational History   Occupation: Retired  Tobacco Use   Smoking status: Never   Smokeless tobacco: Never  Vaping Use   Vaping Use: Never used  Substance and Sexual Activity   Alcohol use: No   Drug use: No   Sexual activity: Not Currently  Other Topics Concern   Not on file  Social History Narrative   Not on file   Social Determinants of Health   Financial Resource Strain: Not on file  Food Insecurity: Not on file  Transportation Needs: Not on file  Physical Activity: Not on file  Stress: Not on file  Social Connections: Not on file     Family History: The patient's family history includes Emphysema in his mother; Heart attack (age of onset: 88) in his father; Rheum arthritis in his sister.  ROS:   Please see the history of present illness.    All other systems reviewed and are negative.  EKGs/Labs/Other Studies Reviewed:    The following studies were reviewed today: EKG reveals sinus rhythm and nonspecific ST-T changes   Recent Labs: No results found for requested labs within last 8760 hours.  Recent Lipid Panel    Component Value Date/Time   CHOL 120 06/21/2019 1032   TRIG 100 06/21/2019 1032   HDL 47 06/21/2019 1032   CHOLHDL 2.6 06/21/2019 1032   CHOLHDL 3.0 12/23/2017 0445   VLDL 10 12/23/2017 0445   LDLCALC 54 06/21/2019 1032    Physical Exam:    VS:  BP (!) 146/80   Pulse 78   Ht 5\' 8"  (1.727 m)   Wt 205 lb 3.2 oz (93.1 kg)   SpO2 95%   BMI 31.20 kg/m     Wt Readings from Last 3 Encounters:  10/27/20 205 lb 3.2 oz (93.1 kg)  06/21/19 217 lb (98.4 kg)  12/19/18 210 lb (95.3 kg)     GEN: Patient is in no acute distress HEENT: Normal NECK: No JVD; No carotid bruits  LYMPHATICS: No lymphadenopathy CARDIAC: Hear sounds regular, 2/6 systolic murmur at the apex. RESPIRATORY:  Clear to auscultation without rales, wheezing or rhonchi  ABDOMEN: Soft,  non-tender, non-distended MUSCULOSKELETAL:  No edema; No deformity  SKIN: Warm and dry NEUROLOGIC:  Alert and oriented x 3 PSYCHIATRIC:  Normal affect   Signed, Jenean Lindau, MD  10/27/2020 3:38 PM    [facility]

## 2020-10-27 NOTE — Telephone Encounter (Signed)
Atorvastatin refill sent to pharmacy 

## 2020-10-27 NOTE — Patient Instructions (Signed)
Medication Instructions:  Your physician has recommended you make the following change in your medication:   Start taking 81 mg aspirin daily.  Use Nitroglycerin as needed for chest pain.  Use nitroglycerin 1 tablet placed under the tongue at the first sign of chest pain or an angina attack. 1 tablet may be used every 5 minutes as needed, for up to 15 minutes. Do not take more than 3 tablets in 15 minutes. If pain persist call 911 or go to the nearest ED.   *If you need a refill on your cardiac medications before your next appointment, please call your pharmacy*   Lab Work: Your physician recommends that you have labs done in the office today. Your test included  basic metabolic panel, complete blood count, TSH, liver function and lipids.  If you have labs (blood work) drawn today and your tests are completely normal, you will receive your results only by: [place] (if you have MyChart) OR A paper copy in the mail If you have any lab test that is abnormal or we need to change your treatment, we will call you to review the results.   Testing/Procedures: None ordered   Follow-Up: At [facility], you and your health needs are our priority.  As part of our continuing mission to provide you with exceptional heart care, we have created designated Provider Care Teams.  These Care Teams include your primary Cardiologist (physician) and Advanced Practice Providers (APPs -  Physician Assistants and Nurse Practitioners) who all work together to provide you with the care you need, when you need it.  We recommend signing up for the patient portal called "MyChart".  Sign up information is provided on this After Visit Summary.  MyChart is used to connect with patients for Virtual Visits (Telemedicine).  Patients are able to view lab/test results, encounter notes, upcoming appointments, etc.  Non-urgent messages can be sent to your provider as well.   To learn more about what you can do with  MyChart, go to [email or web address].    Your next appointment:   6 month(s)  The format for your next appointment:   In Person  Provider:   Jyl Heinz, MD   Other Instructions NA

## 2020-10-27 NOTE — Telephone Encounter (Signed)
Pt needs refill on atorvastatin  Thank you!!  [phone]

## 2020-10-28 LAB — CBC WITH DIFFERENTIAL/PLATELET
Basophils Absolute: 0.1 10*3/uL (ref 0.0–0.2)
Basos: 1 %
EOS (ABSOLUTE): 0.1 10*3/uL (ref 0.0–0.4)
Eos: 1 %
Hematocrit: 39.1 % (ref 37.5–51.0)
Hemoglobin: 13.4 g/dL (ref 13.0–17.7)
Immature Grans (Abs): 0 10*3/uL (ref 0.0–0.1)
Immature Granulocytes: 0 %
Lymphocytes Absolute: 1.7 10*3/uL (ref 0.7–3.1)
Lymphs: 21 %
MCH: 32.8 pg (ref 26.6–33.0)
MCHC: 34.3 g/dL (ref 31.5–35.7)
MCV: 96 fL (ref 79–97)
Monocytes Absolute: 0.5 10*3/uL (ref 0.1–0.9)
Monocytes: 6 %
Neutrophils Absolute: 5.5 10*3/uL (ref 1.4–7.0)
Neutrophils: 71 %
Platelets: 127 10*3/uL — ABNORMAL LOW (ref 150–450)
RBC: 4.09 x10E6/uL — ABNORMAL LOW (ref 4.14–5.80)
RDW: 12.6 % (ref 11.6–15.4)
WBC: 7.8 10*3/uL (ref 3.4–10.8)

## 2020-10-28 LAB — BASIC METABOLIC PANEL
BUN/Creatinine Ratio: 15 (ref 10–24)
BUN: 26 mg/dL (ref 8–27)
CO2: 21 mmol/L (ref 20–29)
Calcium: 9.1 mg/dL (ref 8.6–10.2)
Chloride: 103 mmol/L (ref 96–106)
Creatinine, Ser: 1.69 mg/dL — ABNORMAL HIGH (ref 0.76–1.27)
Glucose: 90 mg/dL (ref 65–99)
Potassium: 5.1 mmol/L (ref 3.5–5.2)
Sodium: 138 mmol/L (ref 134–144)
eGFR: 39 mL/min/{1.73_m2} — ABNORMAL LOW (ref >59)

## 2020-10-28 LAB — LIPID PANEL
Chol/HDL Ratio: 2.7 ratio (ref 0.0–5.0)
Cholesterol, Total: 121 mg/dL (ref 100–199)
HDL: 45 mg/dL (ref >39)
LDL Chol Calc (NIH): 59 mg/dL (ref 0–99)
Triglycerides: 84 mg/dL (ref 0–149)
VLDL Cholesterol Cal: 17 mg/dL (ref 5–40)

## 2020-10-28 LAB — HEPATIC FUNCTION PANEL
ALT: 13 IU/L (ref 0–44)
AST: 20 IU/L (ref 0–40)
Albumin: 4.6 g/dL (ref 3.6–4.6)
Alkaline Phosphatase: 89 IU/L (ref 44–121)
Bilirubin Total: 0.5 mg/dL (ref 0.0–1.2)
Bilirubin, Direct: 0.15 mg/dL (ref 0.00–0.40)
Total Protein: 6.6 g/dL (ref 6.0–8.5)

## 2020-10-28 LAB — TSH: TSH: 3.38 u[IU]/mL (ref 0.450–4.500)

## 2020-11-11 DIAGNOSIS — M79675 Pain in left toe(s): Secondary | ICD-10-CM | POA: Diagnosis not present

## 2020-11-11 DIAGNOSIS — B351 Tinea unguium: Secondary | ICD-10-CM | POA: Diagnosis not present

## 2020-11-11 DIAGNOSIS — M79674 Pain in right toe(s): Secondary | ICD-10-CM | POA: Diagnosis not present

## 2020-11-13 DIAGNOSIS — M109 Gout, unspecified: Secondary | ICD-10-CM | POA: Diagnosis not present

## 2020-11-13 DIAGNOSIS — N183 Chronic kidney disease, stage 3 unspecified: Secondary | ICD-10-CM | POA: Diagnosis not present

## 2020-11-13 DIAGNOSIS — I10 Essential (primary) hypertension: Secondary | ICD-10-CM | POA: Diagnosis not present

## 2020-11-13 DIAGNOSIS — F331 Major depressive disorder, recurrent, moderate: Secondary | ICD-10-CM | POA: Diagnosis not present

## 2020-11-24 DIAGNOSIS — C44319 Basal cell carcinoma of skin of other parts of face: Secondary | ICD-10-CM | POA: Diagnosis not present

## 2020-11-25 DIAGNOSIS — R339 Retention of urine, unspecified: Secondary | ICD-10-CM | POA: Diagnosis not present

## 2020-11-25 DIAGNOSIS — R351 Nocturia: Secondary | ICD-10-CM | POA: Diagnosis not present

## 2020-11-25 DIAGNOSIS — N401 Enlarged prostate with lower urinary tract symptoms: Secondary | ICD-10-CM | POA: Diagnosis not present

## 2020-12-04 DIAGNOSIS — C44319 Basal cell carcinoma of skin of other parts of face: Secondary | ICD-10-CM | POA: Diagnosis not present

## 2021-01-13 DIAGNOSIS — M79675 Pain in left toe(s): Secondary | ICD-10-CM | POA: Diagnosis not present

## 2021-01-13 DIAGNOSIS — M79674 Pain in right toe(s): Secondary | ICD-10-CM | POA: Diagnosis not present

## 2021-01-13 DIAGNOSIS — B351 Tinea unguium: Secondary | ICD-10-CM | POA: Diagnosis not present

## 2021-01-31 DIAGNOSIS — Z23 Encounter for immunization: Secondary | ICD-10-CM | POA: Diagnosis not present

## 2021-03-12 DIAGNOSIS — M79662 Pain in left lower leg: Secondary | ICD-10-CM | POA: Diagnosis not present

## 2021-03-12 DIAGNOSIS — M79605 Pain in left leg: Secondary | ICD-10-CM | POA: Diagnosis not present

## 2021-03-12 DIAGNOSIS — Z6830 Body mass index (BMI) 30.0-30.9, adult: Secondary | ICD-10-CM | POA: Diagnosis not present

## 2021-03-16 DIAGNOSIS — E785 Hyperlipidemia, unspecified: Secondary | ICD-10-CM | POA: Diagnosis not present

## 2021-03-16 DIAGNOSIS — G4700 Insomnia, unspecified: Secondary | ICD-10-CM | POA: Diagnosis not present

## 2021-03-16 DIAGNOSIS — N1832 Chronic kidney disease, stage 3b: Secondary | ICD-10-CM | POA: Diagnosis not present

## 2021-03-17 DIAGNOSIS — N1832 Chronic kidney disease, stage 3b: Secondary | ICD-10-CM | POA: Diagnosis not present

## 2021-03-17 DIAGNOSIS — R7301 Impaired fasting glucose: Secondary | ICD-10-CM | POA: Diagnosis not present

## 2021-03-17 DIAGNOSIS — E785 Hyperlipidemia, unspecified: Secondary | ICD-10-CM | POA: Diagnosis not present

## 2021-03-17 DIAGNOSIS — Z0000 Encounter for general adult medical examination without abnormal findings: Secondary | ICD-10-CM | POA: Diagnosis not present

## 2021-03-17 DIAGNOSIS — N2581 Secondary hyperparathyroidism of renal origin: Secondary | ICD-10-CM | POA: Diagnosis not present

## 2021-03-17 DIAGNOSIS — I1310 Hypertensive heart and chronic kidney disease without heart failure, with stage 1 through stage 4 chronic kidney disease, or unspecified chronic kidney disease: Secondary | ICD-10-CM | POA: Diagnosis not present

## 2021-03-26 DIAGNOSIS — M79675 Pain in left toe(s): Secondary | ICD-10-CM | POA: Diagnosis not present

## 2021-03-26 DIAGNOSIS — M79674 Pain in right toe(s): Secondary | ICD-10-CM | POA: Diagnosis not present

## 2021-03-26 DIAGNOSIS — B351 Tinea unguium: Secondary | ICD-10-CM | POA: Diagnosis not present

## 2021-04-21 DIAGNOSIS — L570 Actinic keratosis: Secondary | ICD-10-CM | POA: Diagnosis not present

## 2021-04-21 DIAGNOSIS — C44319 Basal cell carcinoma of skin of other parts of face: Secondary | ICD-10-CM | POA: Diagnosis not present

## 2021-04-21 DIAGNOSIS — L578 Other skin changes due to chronic exposure to nonionizing radiation: Secondary | ICD-10-CM | POA: Diagnosis not present

## 2021-05-02 DIAGNOSIS — Z23 Encounter for immunization: Secondary | ICD-10-CM | POA: Diagnosis not present

## 2021-05-15 ENCOUNTER — Ambulatory Visit: Payer: 59 | Admitting: Cardiology

## 2021-05-28 DIAGNOSIS — B351 Tinea unguium: Secondary | ICD-10-CM | POA: Diagnosis not present

## 2021-05-28 DIAGNOSIS — M79674 Pain in right toe(s): Secondary | ICD-10-CM | POA: Diagnosis not present

## 2021-05-28 DIAGNOSIS — M79675 Pain in left toe(s): Secondary | ICD-10-CM | POA: Diagnosis not present

## 2021-06-01 DIAGNOSIS — R339 Retention of urine, unspecified: Secondary | ICD-10-CM | POA: Diagnosis not present

## 2021-06-01 DIAGNOSIS — Z125 Encounter for screening for malignant neoplasm of prostate: Secondary | ICD-10-CM | POA: Diagnosis not present

## 2021-06-01 DIAGNOSIS — N401 Enlarged prostate with lower urinary tract symptoms: Secondary | ICD-10-CM | POA: Diagnosis not present

## 2021-06-01 DIAGNOSIS — C44319 Basal cell carcinoma of skin of other parts of face: Secondary | ICD-10-CM | POA: Diagnosis not present

## 2021-06-23 DIAGNOSIS — I1310 Hypertensive heart and chronic kidney disease without heart failure, with stage 1 through stage 4 chronic kidney disease, or unspecified chronic kidney disease: Secondary | ICD-10-CM | POA: Diagnosis not present

## 2021-06-23 DIAGNOSIS — N2581 Secondary hyperparathyroidism of renal origin: Secondary | ICD-10-CM | POA: Diagnosis not present

## 2021-06-23 DIAGNOSIS — R7301 Impaired fasting glucose: Secondary | ICD-10-CM | POA: Diagnosis not present

## 2021-06-23 DIAGNOSIS — G63 Polyneuropathy in diseases classified elsewhere: Secondary | ICD-10-CM | POA: Diagnosis not present

## 2021-06-23 DIAGNOSIS — N1832 Chronic kidney disease, stage 3b: Secondary | ICD-10-CM | POA: Diagnosis not present

## 2021-06-23 DIAGNOSIS — G5702 Lesion of sciatic nerve, left lower limb: Secondary | ICD-10-CM | POA: Diagnosis not present

## 2021-06-23 DIAGNOSIS — Z79899 Other long term (current) drug therapy: Secondary | ICD-10-CM | POA: Diagnosis not present

## 2021-08-13 DIAGNOSIS — M79675 Pain in left toe(s): Secondary | ICD-10-CM | POA: Diagnosis not present

## 2021-08-13 DIAGNOSIS — M79674 Pain in right toe(s): Secondary | ICD-10-CM | POA: Diagnosis not present

## 2021-08-13 DIAGNOSIS — B351 Tinea unguium: Secondary | ICD-10-CM | POA: Diagnosis not present

## 2021-08-19 DIAGNOSIS — R7301 Impaired fasting glucose: Secondary | ICD-10-CM | POA: Diagnosis not present

## 2021-08-19 DIAGNOSIS — G5702 Lesion of sciatic nerve, left lower limb: Secondary | ICD-10-CM | POA: Diagnosis not present

## 2021-08-19 DIAGNOSIS — N2581 Secondary hyperparathyroidism of renal origin: Secondary | ICD-10-CM | POA: Diagnosis not present

## 2021-08-19 DIAGNOSIS — I1310 Hypertensive heart and chronic kidney disease without heart failure, with stage 1 through stage 4 chronic kidney disease, or unspecified chronic kidney disease: Secondary | ICD-10-CM | POA: Diagnosis not present

## 2021-08-19 DIAGNOSIS — I25119 Atherosclerotic heart disease of native coronary artery with unspecified angina pectoris: Secondary | ICD-10-CM | POA: Diagnosis not present

## 2021-08-19 DIAGNOSIS — E785 Hyperlipidemia, unspecified: Secondary | ICD-10-CM | POA: Diagnosis not present

## 2021-08-19 DIAGNOSIS — N1832 Chronic kidney disease, stage 3b: Secondary | ICD-10-CM | POA: Diagnosis not present

## 2021-08-19 DIAGNOSIS — G63 Polyneuropathy in diseases classified elsewhere: Secondary | ICD-10-CM | POA: Diagnosis not present

## 2021-08-19 DIAGNOSIS — D631 Anemia in chronic kidney disease: Secondary | ICD-10-CM | POA: Diagnosis not present

## 2021-08-27 DIAGNOSIS — L570 Actinic keratosis: Secondary | ICD-10-CM | POA: Diagnosis not present

## 2021-08-27 DIAGNOSIS — C44519 Basal cell carcinoma of skin of other part of trunk: Secondary | ICD-10-CM | POA: Diagnosis not present

## 2021-08-28 ENCOUNTER — Ambulatory Visit: Payer: 59 | Admitting: Cardiology

## 2021-09-18 DIAGNOSIS — R7301 Impaired fasting glucose: Secondary | ICD-10-CM | POA: Diagnosis not present

## 2021-09-18 DIAGNOSIS — Z6828 Body mass index (BMI) 28.0-28.9, adult: Secondary | ICD-10-CM | POA: Diagnosis not present

## 2021-09-18 DIAGNOSIS — G5702 Lesion of sciatic nerve, left lower limb: Secondary | ICD-10-CM | POA: Diagnosis not present

## 2021-09-22 DIAGNOSIS — R339 Retention of urine, unspecified: Secondary | ICD-10-CM | POA: Diagnosis not present

## 2021-09-22 DIAGNOSIS — G629 Polyneuropathy, unspecified: Secondary | ICD-10-CM | POA: Insufficient documentation

## 2021-09-22 DIAGNOSIS — N401 Enlarged prostate with lower urinary tract symptoms: Secondary | ICD-10-CM | POA: Diagnosis not present

## 2021-09-23 ENCOUNTER — Ambulatory Visit: Payer: 59 | Admitting: Cardiology

## 2021-09-30 ENCOUNTER — Ambulatory Visit: Payer: 59 | Admitting: Cardiology

## 2021-10-06 DIAGNOSIS — H524 Presbyopia: Secondary | ICD-10-CM | POA: Diagnosis not present

## 2021-10-08 ENCOUNTER — Ambulatory Visit: Payer: Medicare HMO | Admitting: Cardiology

## 2021-10-08 ENCOUNTER — Encounter: Payer: Self-pay | Admitting: Cardiology

## 2021-10-08 VITALS — BP 132/58 | HR 78 | Ht 69.6 in | Wt 188.6 lb

## 2021-10-08 DIAGNOSIS — Z951 Presence of aortocoronary bypass graft: Secondary | ICD-10-CM | POA: Diagnosis not present

## 2021-10-08 DIAGNOSIS — I2510 Atherosclerotic heart disease of native coronary artery without angina pectoris: Secondary | ICD-10-CM

## 2021-10-08 DIAGNOSIS — I10 Essential (primary) hypertension: Secondary | ICD-10-CM | POA: Diagnosis not present

## 2021-10-08 DIAGNOSIS — E785 Hyperlipidemia, unspecified: Secondary | ICD-10-CM

## 2021-10-08 DIAGNOSIS — N289 Disorder of kidney and ureter, unspecified: Secondary | ICD-10-CM | POA: Diagnosis not present

## 2021-10-08 NOTE — Patient Instructions (Signed)

## 2021-10-08 NOTE — Progress Notes (Signed)
Cardiology Office Note:    Date:  10/08/2021   [name], DOB 01-29-1932, MRN [id number]  PCP:  Street, Sharon Mt, MD  Cardiologist:  Jenean Lindau, MD   Referring MD: [address], Sharon Mt, *    ASSESSMENT:    1. Coronary artery disease involving native coronary artery of native heart without angina pectoris   2. Hyperlipidemia with target LDL less than 70   3. Primary hypertension   4. S/P CABG x 4   5. Renal insufficiency    PLAN:    In order of problems listed above:  Coronary artery disease: Secondary prevention stressed with the patient.  Importance of compliance with diet medication stressed and he vocalized understanding.  He was advised to have another day 5 days a week and he promises to do so. Essential hypertension: Blood pressure is stable diet was emphasized.  Lifestyle modification measures Mixed dyslipidemia: Lab work reviewed from [employer] she.  Primary care is following his labs.  Diet was emphasized. Renal insufficiency: Appears stable and managed by primary care. Patient will be seen in follow-up appointment in 9 months or earlier if the patient has any concerns    Medication Adjustments/Labs and Tests Ordered: Current medicines are reviewed at length with the patient today.  Concerns regarding medicines are outlined above.  No orders of the defined types were placed in this encounter.  No orders of the defined types were placed in this encounter.    No chief complaint on file.    History of Present Illness:    [name] is a 88 y.o. male patient who has past medical history of essential hypertension, mixed dyslipidemia, coronary artery disease and renal insufficiency.  He denies any problems at this time and takes care of activities of daily living.  No chest pain orthopnea or PND.  He is active for his age.  At the time of my evaluation, the patient is alert awake oriented and in no distress.  Past Medical History:  Diagnosis Date    Acute pain of right shoulder 02/24/2015   Acute renal failure with acute tubular necrosis superimposed on stage 3 chronic kidney disease (HCC)    Anemia    takes Iron pill daily   Arthritis    Cancer ([place]) 02/14/13   skin cancer   Cataract    right eye  and immature   Chest pain with high risk for cardiac etiology, abnormal Myoview 12/22/2017   Closed displaced fracture of neck of left fifth metacarpal bone 02/24/2015   Constipation    Coronary artery disease 12/28/2017   Depression    no meds required   Displaced intertrochanteric fracture of right femur ([place]) 02/14/2013   Enlarged prostate    takes Flomax daily   Gout    takes Allopurinol daily   Hemorrhoids    occasionally   History of blood transfusion    lap - colon blockage   History of bronchitis    last time in Dec 2014   History of colon polyps    History of UTI    takes Trimethoprim daily   Hyperlipidemia with target LDL less than 70    Hypertension    takes Amlodipine and Avapro daily   Ileus ([place]) 02/16/2013   Impaired hearing    wears hearing aids   Insomnia    takes Restoril nightly   Joint pain    Lumbar stenosis with neurogenic claudication 06/01/2013   Neuromuscular disorder ([place]) 02/14/13   damnage nerves  in bil feet   Neuropathy    Pain of left hand 02/24/2015   Peripheral neuropathy    Presence of artificial knee joint 08/17/2012   Renal calculus    large   Renal insufficiency 01/31/2018   Rupture of left proximal biceps tendon 04/05/2019   S/P CABG x 4 12/28/2017   LIMA to LAD SVG to DIAGONAL SVG to CIRCUMFLEX SVG to PDA   S/P total knee arthroplasty 09/03/2013   Shortness of breath    with exertion   Spinal stenosis of lumbar region 02/14/2013   Spinal stenosis, lumbar region, with neurogenic claudication 06/01/2013   Trigger middle finger of left hand 03/18/2016   Urinary retention 02/20/2013    Past Surgical History:  Procedure Laterality Date   APPENDECTOMY  1964   basal cell removed from back  and face   2005   basal cell removed from cheek and nose  2009   basal cell removed from left ear  2014   COLONOSCOPY  2010   CORONARY ARTERY BYPASS GRAFT N/A 12/28/2017   Procedure: CORONARY ARTERY BYPASS GRAFTING (CABG) USING LEFT INTERNAL MAMMARY ARTERY TO LAD  AND ENDOSCOPICALLY HARVESTED RIGHT SAPHENOUS VEIN TO DIAGONAL, CIRC AND PDA;  Surgeon: Grace Isaac, MD;  Location: [facility];  Service: Open Heart Surgery;  Laterality: N/A;   ESOPHAGOGASTRODUODENOSCOPY     HEMORRHOID SURGERY  1970/2006   INTRAMEDULLARY (IM) NAIL INTERTROCHANTERIC Right 02/15/2013   Procedure: INTRAMEDULLARY (IM) NAIL INTERTROCHANTRIC;  Surgeon: Rozanna Box, MD;  Location: [facility];  Service: Orthopedics;  Laterality: Right;   JOINT REPLACEMENT Left 09   knee   laparoscopic surgery   2006   d/t blockage in intestine    LEFT HEART CATH AND CORONARY ANGIOGRAPHY N/A 12/23/2017   Procedure: LEFT HEART CATH AND CORONARY ANGIOGRAPHY;  Surgeon: Martinique, Peter M, MD;  Location: [facility] [facility];  Service: Cardiovascular;  Laterality: N/A;   LUMBAR LAMINECTOMY/DECOMPRESSION MICRODISCECTOMY Left 06/01/2013   Procedure: LEFT LUMBAR THREE-FOUR DECOMPRESSION LUMBAR LAMINECTOMY/MICRODISCECTOMY;POSSIBLE LUMBAR FOUR-FIVE;  Surgeon: Charlie Pitter, MD;  Location: [facility];  Service: Neurosurgery;  Laterality: Left;  left   right hip surgery  2014   TEE WITHOUT CARDIOVERSION N/A 12/28/2017   Procedure: TRANSESOPHAGEAL ECHOCARDIOGRAM (TEE);  Surgeon: Grace Isaac, MD;  Location: [facility];  Service: Open Heart Surgery;  Laterality: N/A;   TONSILLECTOMY  1970/2006   TOTAL KNEE ARTHROPLASTY Right 09/03/2013   Procedure: RIGHT TOTAL KNEE ARTHROPLASTY;  Surgeon: Vickey Huger, MD;  Location: [facility];  Service: Orthopedics;  Laterality: Right;    Current Medications: Current Meds  Medication Sig   acetaminophen (TYLENOL) 500 MG tablet Take 1,000 mg by mouth every 6 (six) hours as needed for pain.   allopurinol (ZYLOPRIM) 300 MG tablet  Take 150 mg by mouth every morning.    amLODipine (NORVASC) 5 MG tablet Take 5 mg by mouth daily.   aspirin EC 81 MG tablet Take 1 tablet (81 mg total) by mouth daily. Swallow whole.   atorvastatin (LIPITOR) 20 MG tablet Take 1 tablet (20 mg total) by mouth daily.   bethanechol (URECHOLINE) 10 MG tablet Take 10 mg by mouth at bedtime.   gabapentin (NEURONTIN) 300 MG capsule Take 300 mg by mouth daily.   irbesartan (AVAPRO) 150 MG tablet Take 150 mg by mouth every morning.   Iron-Vitamin C 100-250 MG TABS Take 100 mg by mouth daily at 12 noon.   Multiple Vitamin (MULTIVITAMIN WITH MINERALS) TABS tablet Take 1 tablet by  mouth daily.   nitroGLYCERIN (NITROSTAT) 0.4 MG SL tablet Place 1 tablet (0.4 mg total) under the tongue every 5 (five) minutes as needed for chest pain.   sertraline (ZOLOFT) 50 MG tablet Take 50 mg by mouth as needed (depression).   tamsulosin (FLOMAX) 0.4 MG CAPS capsule Take 0.4 mg by mouth daily after supper.    temazepam (RESTORIL) 15 MG capsule Take 15 mg by mouth at bedtime as needed for sleep.   tiZANidine (ZANAFLEX) 4 MG tablet Take 4 mg by mouth 2 (two) times daily as needed for muscle spasms.   vitamin B-12 (CYANOCOBALAMIN) 500 MCG tablet Take 500 mcg by mouth daily.   vitamin C (ASCORBIC ACID) 500 MG tablet Take 500 mg by mouth daily.     Allergies:   Ativan [lorazepam] and Adhesive [tape]   Social History   Socioeconomic History   Marital status: Married    Spouse name: Not on file   Number of children: Not on file   Years of education: Not on file   Highest education level: Not on file  Occupational History   Occupation: Retired  Tobacco Use   Smoking status: Never   Smokeless tobacco: Never  Vaping Use   Vaping Use: Never used  Substance and Sexual Activity   Alcohol use: No   Drug use: No   Sexual activity: Not Currently  Other Topics Concern   Not on file  Social History Narrative   Not on file   Social Determinants of Health   Financial  Resource Strain: Not on file  Food Insecurity: Not on file  Transportation Needs: Not on file  Physical Activity: Not on file  Stress: Not on file  Social Connections: Not on file     Family History: The patient's family history includes Emphysema in his mother; Heart attack (age of onset: 41) in his father; Rheum arthritis in his sister.  ROS:   Please see the history of present illness.    All other systems reviewed and are negative.  EKGs/Labs/Other Studies Reviewed:    The following studies were reviewed today: I discussed my findings with the patient at length.   Recent Labs: 10/27/2020: ALT 13; BUN 26; Creatinine, Ser 1.69; Hemoglobin 13.4; Platelets 127; Potassium 5.1; Sodium 138; TSH 3.380  Recent Lipid Panel    Component Value Date/Time   CHOL 121 10/27/2020 1600   TRIG 84 10/27/2020 1600   HDL 45 10/27/2020 1600   CHOLHDL 2.7 10/27/2020 1600   CHOLHDL 3.0 12/23/2017 0445   VLDL 10 12/23/2017 0445   LDLCALC 59 10/27/2020 1600    Physical Exam:    VS:  BP (!) 132/58   Pulse 78   Ht 5' 9.6" (1.768 m)   Wt 188 lb 9.6 oz (85.5 kg)   SpO2 97%   BMI 27.37 kg/m     Wt Readings from Last 3 Encounters:  10/08/21 188 lb 9.6 oz (85.5 kg)  10/27/20 205 lb 3.2 oz (93.1 kg)  06/21/19 217 lb (98.4 kg)     GEN: Patient is in no acute distress HEENT: Normal NECK: No JVD; No carotid bruits LYMPHATICS: No lymphadenopathy CARDIAC: Hear sounds regular, 2/6 systolic murmur at the apex. RESPIRATORY:  Clear to auscultation without rales, wheezing or rhonchi  ABDOMEN: Soft, non-tender, non-distended MUSCULOSKELETAL:  No edema; No deformity  SKIN: Warm and dry NEUROLOGIC:  Alert and oriented x 3 PSYCHIATRIC:  Normal affect   Signed, Jenean Lindau, MD  10/08/2021 1:27 PM  [facility]

## 2021-10-15 DIAGNOSIS — M79674 Pain in right toe(s): Secondary | ICD-10-CM | POA: Diagnosis not present

## 2021-10-15 DIAGNOSIS — M79675 Pain in left toe(s): Secondary | ICD-10-CM | POA: Diagnosis not present

## 2021-10-15 DIAGNOSIS — B351 Tinea unguium: Secondary | ICD-10-CM | POA: Diagnosis not present

## 2021-11-13 ENCOUNTER — Other Ambulatory Visit: Payer: Self-pay | Admitting: Cardiology

## 2021-11-30 DIAGNOSIS — N401 Enlarged prostate with lower urinary tract symptoms: Secondary | ICD-10-CM | POA: Diagnosis not present

## 2021-11-30 DIAGNOSIS — R339 Retention of urine, unspecified: Secondary | ICD-10-CM | POA: Diagnosis not present

## 2021-12-17 DIAGNOSIS — M79674 Pain in right toe(s): Secondary | ICD-10-CM | POA: Diagnosis not present

## 2021-12-17 DIAGNOSIS — B351 Tinea unguium: Secondary | ICD-10-CM | POA: Diagnosis not present

## 2021-12-17 DIAGNOSIS — M79675 Pain in left toe(s): Secondary | ICD-10-CM | POA: Diagnosis not present

## 2022-02-23 DIAGNOSIS — M79674 Pain in right toe(s): Secondary | ICD-10-CM | POA: Diagnosis not present

## 2022-02-23 DIAGNOSIS — M79675 Pain in left toe(s): Secondary | ICD-10-CM | POA: Diagnosis not present

## 2022-02-23 DIAGNOSIS — B351 Tinea unguium: Secondary | ICD-10-CM | POA: Diagnosis not present

## 2022-03-01 DIAGNOSIS — L821 Other seborrheic keratosis: Secondary | ICD-10-CM | POA: Diagnosis not present

## 2022-03-01 DIAGNOSIS — L570 Actinic keratosis: Secondary | ICD-10-CM | POA: Diagnosis not present

## 2022-03-01 DIAGNOSIS — C4441 Basal cell carcinoma of skin of scalp and neck: Secondary | ICD-10-CM | POA: Diagnosis not present

## 2022-03-01 DIAGNOSIS — L578 Other skin changes due to chronic exposure to nonionizing radiation: Secondary | ICD-10-CM | POA: Diagnosis not present

## 2022-03-01 DIAGNOSIS — C44519 Basal cell carcinoma of skin of other part of trunk: Secondary | ICD-10-CM | POA: Diagnosis not present

## 2022-03-03 ENCOUNTER — Other Ambulatory Visit: Payer: Self-pay | Admitting: Cardiology

## 2022-04-06 DIAGNOSIS — Z79899 Other long term (current) drug therapy: Secondary | ICD-10-CM | POA: Diagnosis not present

## 2022-04-06 DIAGNOSIS — E785 Hyperlipidemia, unspecified: Secondary | ICD-10-CM | POA: Diagnosis not present

## 2022-04-06 DIAGNOSIS — I1310 Hypertensive heart and chronic kidney disease without heart failure, with stage 1 through stage 4 chronic kidney disease, or unspecified chronic kidney disease: Secondary | ICD-10-CM | POA: Diagnosis not present

## 2022-04-06 DIAGNOSIS — J40 Bronchitis, not specified as acute or chronic: Secondary | ICD-10-CM | POA: Diagnosis not present

## 2022-04-06 DIAGNOSIS — J329 Chronic sinusitis, unspecified: Secondary | ICD-10-CM | POA: Diagnosis not present

## 2022-04-06 DIAGNOSIS — I25119 Atherosclerotic heart disease of native coronary artery with unspecified angina pectoris: Secondary | ICD-10-CM | POA: Diagnosis not present

## 2022-04-06 DIAGNOSIS — R7302 Impaired glucose tolerance (oral): Secondary | ICD-10-CM | POA: Diagnosis not present

## 2022-04-06 DIAGNOSIS — Z0000 Encounter for general adult medical examination without abnormal findings: Secondary | ICD-10-CM | POA: Diagnosis not present

## 2022-04-06 DIAGNOSIS — N2581 Secondary hyperparathyroidism of renal origin: Secondary | ICD-10-CM | POA: Diagnosis not present

## 2022-04-06 DIAGNOSIS — N1832 Chronic kidney disease, stage 3b: Secondary | ICD-10-CM | POA: Diagnosis not present

## 2022-04-29 DIAGNOSIS — M79675 Pain in left toe(s): Secondary | ICD-10-CM | POA: Diagnosis not present

## 2022-04-29 DIAGNOSIS — M79674 Pain in right toe(s): Secondary | ICD-10-CM | POA: Diagnosis not present

## 2022-04-29 DIAGNOSIS — B351 Tinea unguium: Secondary | ICD-10-CM | POA: Diagnosis not present

## 2022-05-14 ENCOUNTER — Telehealth: Payer: Self-pay

## 2022-05-14 NOTE — Patient Outreach (Signed)
  Care Coordination   Initial Visit Note   05/14/2022 Name: [name] MRN: [id number] DOB: [birth year]  [name] is a 88 y.o. year old male who sees Street, Sharon Mt, MD for primary care. I spoke with  Guy Begin by phone today.  What matters to the patients health and wellness today?  Placed call to patient today to review and offer [facility] care coordination program.  Patient reports that he is doing well and denies any needs at this time.     SDOH assessments and interventions completed:  No     Care Coordination Interventions:  No, not indicated   Follow up plan: No further intervention required.   Encounter Outcome:  Pt. Refused   Tomasa Rand, RN, BSN, CEN [facility] [employer] [phone]

## 2022-06-08 DIAGNOSIS — R339 Retention of urine, unspecified: Secondary | ICD-10-CM | POA: Diagnosis not present

## 2022-06-08 DIAGNOSIS — Z125 Encounter for screening for malignant neoplasm of prostate: Secondary | ICD-10-CM | POA: Diagnosis not present

## 2022-06-08 DIAGNOSIS — N401 Enlarged prostate with lower urinary tract symptoms: Secondary | ICD-10-CM | POA: Diagnosis not present

## 2022-07-05 DIAGNOSIS — L570 Actinic keratosis: Secondary | ICD-10-CM | POA: Diagnosis not present

## 2022-07-05 DIAGNOSIS — C4441 Basal cell carcinoma of skin of scalp and neck: Secondary | ICD-10-CM | POA: Diagnosis not present

## 2022-07-05 DIAGNOSIS — L821 Other seborrheic keratosis: Secondary | ICD-10-CM | POA: Diagnosis not present

## 2022-07-05 DIAGNOSIS — L578 Other skin changes due to chronic exposure to nonionizing radiation: Secondary | ICD-10-CM | POA: Diagnosis not present

## 2022-07-07 DIAGNOSIS — B351 Tinea unguium: Secondary | ICD-10-CM | POA: Diagnosis not present

## 2022-07-07 DIAGNOSIS — M79674 Pain in right toe(s): Secondary | ICD-10-CM | POA: Diagnosis not present

## 2022-07-07 DIAGNOSIS — M79675 Pain in left toe(s): Secondary | ICD-10-CM | POA: Diagnosis not present

## 2022-08-17 DIAGNOSIS — I25119 Atherosclerotic heart disease of native coronary artery with unspecified angina pectoris: Secondary | ICD-10-CM | POA: Diagnosis not present

## 2022-08-17 DIAGNOSIS — N2581 Secondary hyperparathyroidism of renal origin: Secondary | ICD-10-CM | POA: Diagnosis not present

## 2022-08-17 DIAGNOSIS — Z6829 Body mass index (BMI) 29.0-29.9, adult: Secondary | ICD-10-CM | POA: Diagnosis not present

## 2022-08-17 DIAGNOSIS — N1832 Chronic kidney disease, stage 3b: Secondary | ICD-10-CM | POA: Diagnosis not present

## 2022-08-17 DIAGNOSIS — E663 Overweight: Secondary | ICD-10-CM | POA: Diagnosis not present

## 2022-08-17 DIAGNOSIS — F331 Major depressive disorder, recurrent, moderate: Secondary | ICD-10-CM | POA: Diagnosis not present

## 2022-08-17 DIAGNOSIS — M1A0720 Idiopathic chronic gout, left ankle and foot, without tophus (tophi): Secondary | ICD-10-CM | POA: Diagnosis not present

## 2022-09-09 DIAGNOSIS — M79674 Pain in right toe(s): Secondary | ICD-10-CM | POA: Diagnosis not present

## 2022-09-09 DIAGNOSIS — M79675 Pain in left toe(s): Secondary | ICD-10-CM | POA: Diagnosis not present

## 2022-09-09 DIAGNOSIS — B351 Tinea unguium: Secondary | ICD-10-CM | POA: Diagnosis not present

## 2022-09-13 DIAGNOSIS — R030 Elevated blood-pressure reading, without diagnosis of hypertension: Secondary | ICD-10-CM | POA: Diagnosis not present

## 2022-09-13 DIAGNOSIS — Z6832 Body mass index (BMI) 32.0-32.9, adult: Secondary | ICD-10-CM | POA: Diagnosis not present

## 2022-09-13 DIAGNOSIS — R42 Dizziness and giddiness: Secondary | ICD-10-CM | POA: Diagnosis not present

## 2022-09-13 DIAGNOSIS — E669 Obesity, unspecified: Secondary | ICD-10-CM | POA: Diagnosis not present

## 2022-09-13 DIAGNOSIS — I10 Essential (primary) hypertension: Secondary | ICD-10-CM | POA: Diagnosis not present

## 2022-09-22 DIAGNOSIS — Z79899 Other long term (current) drug therapy: Secondary | ICD-10-CM | POA: Diagnosis not present

## 2022-09-22 DIAGNOSIS — N2581 Secondary hyperparathyroidism of renal origin: Secondary | ICD-10-CM | POA: Diagnosis not present

## 2022-09-22 DIAGNOSIS — Z6829 Body mass index (BMI) 29.0-29.9, adult: Secondary | ICD-10-CM | POA: Diagnosis not present

## 2022-09-22 DIAGNOSIS — D631 Anemia in chronic kidney disease: Secondary | ICD-10-CM | POA: Diagnosis not present

## 2022-09-22 DIAGNOSIS — G63 Polyneuropathy in diseases classified elsewhere: Secondary | ICD-10-CM | POA: Diagnosis not present

## 2022-09-22 DIAGNOSIS — E785 Hyperlipidemia, unspecified: Secondary | ICD-10-CM | POA: Diagnosis not present

## 2022-09-22 DIAGNOSIS — N1832 Chronic kidney disease, stage 3b: Secondary | ICD-10-CM | POA: Diagnosis not present

## 2022-09-22 DIAGNOSIS — E663 Overweight: Secondary | ICD-10-CM | POA: Diagnosis not present

## 2022-09-22 LAB — LAB REPORT - SCANNED
EGFR: 33
Free T4: 0.87 ng/dL
TSH: 2.01 (ref 0.41–5.90)

## 2022-09-30 ENCOUNTER — Other Ambulatory Visit: Payer: Self-pay | Admitting: Cardiology

## 2022-09-30 NOTE — Telephone Encounter (Signed)
Refill to pharmacy 

## 2022-10-26 DIAGNOSIS — H524 Presbyopia: Secondary | ICD-10-CM | POA: Diagnosis not present

## 2022-11-02 ENCOUNTER — Encounter: Payer: Self-pay | Admitting: Cardiology

## 2022-11-02 ENCOUNTER — Ambulatory Visit: Payer: Medicare HMO | Attending: Cardiology | Admitting: Cardiology

## 2022-11-02 VITALS — BP 128/60 | HR 70 | Ht 70.0 in | Wt 197.2 lb

## 2022-11-02 DIAGNOSIS — E785 Hyperlipidemia, unspecified: Secondary | ICD-10-CM | POA: Diagnosis not present

## 2022-11-02 DIAGNOSIS — I2510 Atherosclerotic heart disease of native coronary artery without angina pectoris: Secondary | ICD-10-CM | POA: Diagnosis not present

## 2022-11-02 DIAGNOSIS — I10 Essential (primary) hypertension: Secondary | ICD-10-CM | POA: Diagnosis not present

## 2022-11-02 DIAGNOSIS — Z1321 Encounter for screening for nutritional disorder: Secondary | ICD-10-CM

## 2022-11-02 DIAGNOSIS — Z951 Presence of aortocoronary bypass graft: Secondary | ICD-10-CM

## 2022-11-02 NOTE — Patient Instructions (Signed)
Medication Instructions:  Your physician recommends that you continue on your current medications as directed. Please refer to the Current Medication list given to you today.  *If you need a refill on your cardiac medications before your next appointment, please call your pharmacy*   Lab Work: Your physician recommends that you return for lab work in: the next few days You need to have labs done when you are fasting.  You can come Monday through Friday 8:30 am to 12:00 pm and 1:15 to 4:30. You do not need to make an appointment as the order has already been placed. The labs you are going to have done are CMP, CBC, TSH, vitamin D, vitamin B 12 and Lipids.  If you have labs (blood work) drawn today and your tests are completely normal, you will receive your results only by: MyChart Message (if you have MyChart) OR A paper copy in the mail If you have any lab test that is abnormal or we need to change your treatment, we will call you to review the results.   Testing/Procedures: Your physician has requested that you have an echocardiogram. Echocardiography is a painless test that uses sound waves to create images of your heart. It provides your doctor with information about the size and shape of your heart and how well your heart's chambers and valves are working. This procedure takes approximately one hour. There are no restrictions for this procedure. Please do NOT wear cologne, perfume, aftershave, or lotions (deodorant is allowed). Please arrive 15 minutes prior to your appointment time.     Follow-Up: At [facility], you and your health needs are our priority.  As part of our continuing mission to provide you with exceptional heart care, we have created designated Provider Care Teams.  These Care Teams include your primary Cardiologist (physician) and Advanced Practice Providers (APPs -  Physician Assistants and Nurse Practitioners) who all work together to provide you with the care you  need, when you need it.  We recommend signing up for the patient portal called "MyChart".  Sign up information is provided on this After Visit Summary.  MyChart is used to connect with patients for Virtual Visits (Telemedicine).  Patients are able to view lab/test results, encounter notes, upcoming appointments, etc.  Non-urgent messages can be sent to your provider as well.   To learn more about what you can do with MyChart, go to [email or web address].    Your next appointment:   10 month(s)  The format for your next appointment:   In Person  Provider:   Belva Crome, MD   Other Instructions Echocardiogram An echocardiogram is a test that uses sound waves (ultrasound) to produce images of the heart. Images from an echocardiogram can provide important information about: Heart size and shape. The size and thickness and movement of your heart's walls. Heart muscle function and strength. Heart valve function or if you have stenosis. Stenosis is when the heart valves are too narrow. If blood is flowing backward through the heart valves (regurgitation). A tumor or infectious growth around the heart valves. Areas of heart muscle that are not working well because of poor blood flow or injury from a heart attack. Aneurysm detection. An aneurysm is a weak or damaged part of an artery wall. The wall bulges out from the normal force of blood pumping through the body. Tell a health care provider about: Any allergies you have. All medicines you are taking, including vitamins, herbs, eye drops, creams, and over-the-counter medicines. Any  blood disorders you have. Any surgeries you have had. Any medical conditions you have. Whether you are pregnant or may be pregnant. What are the risks? Generally, this is a safe test. However, problems may occur, including an allergic reaction to dye (contrast) that may be used during the test. What happens before the test? No specific preparation is  needed. You may eat and drink normally. What happens during the test? You will take off your clothes from the waist up and put on a hospital gown. Electrodes or electrocardiogram (ECG)patches may be placed on your chest. The electrodes or patches are then connected to a device that monitors your heart rate and rhythm. You will lie down on a table for an ultrasound exam. A gel will be applied to your chest to help sound waves pass through your skin. A handheld device, called a transducer, will be pressed against your chest and moved over your heart. The transducer produces sound waves that travel to your heart and bounce back (or "echo" back) to the transducer. These sound waves will be captured in real-time and changed into images of your heart that can be viewed on a video monitor. The images will be recorded on a computer and reviewed by your health care provider. You may be asked to change positions or hold your breath for a short time. This makes it easier to get different views or better views of your heart. In some cases, you may receive contrast through an IV in one of your veins. This can improve the quality of the pictures from your heart. The procedure may vary among health care providers and hospitals.   What can I expect after the test? You may return to your normal, everyday life, including diet, activities, and medicines, unless your health care provider tells you not to do that. Follow these instructions at home: It is up to you to get the results of your test. Ask your health care provider, or the department that is doing the test, when your results will be ready. Keep all follow-up visits. This is important. Summary An echocardiogram is a test that uses sound waves (ultrasound) to produce images of the heart. Images from an echocardiogram can provide important information about the size and shape of your heart, heart muscle function, heart valve function, and other possible heart  problems. You do not need to do anything to prepare before this test. You may eat and drink normally. After the echocardiogram is completed, you may return to your normal, everyday life, unless your health care provider tells you not to do that. This information is not intended to replace advice given to you by your health care provider. Make sure you discuss any questions you have with your health care provider. Document Revised: 12/25/2019 Document Reviewed: 12/25/2019 Elsevier Patient Education  2021 Elsevier Inc.   Important Information About Sugar

## 2022-11-02 NOTE — Progress Notes (Signed)
Cardiology Office Note:    Date:  11/02/2022   [name], DOB 12/29/1931, MRN [id number]  PCP:  Street, Stephanie Coup, MD  Cardiologist:  Garwin Brothers, MD   Referring MD: [address] Stephanie Coup, *    ASSESSMENT:    1. Coronary artery disease involving native coronary artery of native heart without angina pectoris   2. Primary hypertension   3. Hyperlipidemia with target LDL less than 70   4. S/P CABG x 4    PLAN:    In order of problems listed above:  Coronary artery disease: Secondary prevention stressed with the patient.  Importance of compliance with diet medication stressed and vocalized understanding.  He ambulates age appropriately and I encouraged him to continue this. Essential hypertension: Blood pressure is stable and diet was emphasized. Mixed dyslipidemia: On lipid-lowering medications.  He will be back in the next few days for complete blood work. Cardiac murmur: Echocardiogram will be done to assess murmur heard on auscultation. Patient will be seen in follow-up appointment in 9 months or earlier if the patient has any concerns.   Medication Adjustments/Labs and Tests Ordered: Current medicines are reviewed at length with the patient today.  Concerns regarding medicines are outlined above.  No orders of the defined types were placed in this encounter.  No orders of the defined types were placed in this encounter.    No chief complaint on file.    History of Present Illness:    [name] is a 89 y.o. male.  Patient has past medical history of coronary artery disease post CABG surgery, essential hypertension, mixed dyslipidemia.  He ambulates age appropriately.  He denies any chest pain orthopnea or PND.  At the time of my evaluation, the patient is alert awake oriented and in no distress.  Past Medical History:  Diagnosis Date   Acute pain of right shoulder 02/24/2015   Acute renal failure with acute tubular necrosis superimposed on stage 3  chronic kidney disease (HCC)    Anemia    takes Iron pill daily   Arthritis    Cancer (HCC) 02/14/13   skin cancer   Cataract    right eye  and immature   Chest pain with high risk for cardiac etiology, abnormal Myoview 12/22/2017   Closed displaced fracture of neck of left fifth metacarpal bone 02/24/2015   Constipation    Coronary artery disease 12/28/2017   Depression    no meds required   Displaced intertrochanteric fracture of right femur (HCC) 02/14/2013   Enlarged prostate    takes Flomax daily   Gout    takes Allopurinol daily   Hemorrhoids    occasionally   History of blood transfusion    lap - colon blockage   History of bronchitis    last time in Dec 2014   History of colon polyps    History of UTI    takes Trimethoprim daily   Hyperlipidemia with target LDL less than 70    Hypertension    takes Amlodipine and Avapro daily   Ileus (HCC) 02/16/2013   Impaired hearing    wears hearing aids   Insomnia    takes Restoril nightly   Joint pain    Lumbar stenosis with neurogenic claudication 06/01/2013   Neuromuscular disorder (HCC) 02/14/13   damnage nerves in bil feet   Neuropathy    Pain of left hand 02/24/2015   Peripheral neuropathy    Presence of artificial knee joint 08/17/2012  Renal calculus    large   Renal insufficiency 01/31/2018   Rupture of left proximal biceps tendon 04/05/2019   S/P CABG x 4 12/28/2017   LIMA to LAD SVG to DIAGONAL SVG to CIRCUMFLEX SVG to PDA   S/P total knee arthroplasty 09/03/2013   Shortness of breath    with exertion   Spinal stenosis of lumbar region 02/14/2013   Spinal stenosis, lumbar region, with neurogenic claudication 06/01/2013   Trigger middle finger of left hand 03/18/2016   Urinary retention 02/20/2013    Past Surgical History:  Procedure Laterality Date   APPENDECTOMY  1964   basal cell removed from back and face   2005   basal cell removed from cheek and nose  2009   basal cell removed from left ear  2014    COLONOSCOPY  2010   CORONARY ARTERY BYPASS GRAFT N/A 12/28/2017   Procedure: CORONARY ARTERY BYPASS GRAFTING (CABG) USING LEFT INTERNAL MAMMARY ARTERY TO LAD  AND ENDOSCOPICALLY HARVESTED RIGHT SAPHENOUS VEIN TO DIAGONAL, CIRC AND PDA;  Surgeon: Delight Ovens, MD;  Location: [facility] OR;  Service: Open Heart Surgery;  Laterality: N/A;   ESOPHAGOGASTRODUODENOSCOPY     HEMORRHOID SURGERY  1970/2006   INTRAMEDULLARY (IM) NAIL INTERTROCHANTERIC Right 02/15/2013   Procedure: INTRAMEDULLARY (IM) NAIL INTERTROCHANTRIC;  Surgeon: Budd Palmer, MD;  Location: [facility] OR;  Service: Orthopedics;  Laterality: Right;   JOINT REPLACEMENT Left 09   knee   laparoscopic surgery   2006   d/t blockage in intestine    LEFT HEART CATH AND CORONARY ANGIOGRAPHY N/A 12/23/2017   Procedure: LEFT HEART CATH AND CORONARY ANGIOGRAPHY;  Surgeon: [place], Peter M, MD;  Location[facility] INVASIVE CV LAB;  Service: Cardiovascular;  Laterality: N/A;   LUMBAR LAMINECTOMY/DECOMPRESSION MICRODISCECTOMY Left 06/01/2013   Procedure: LEFT LUMBAR THREE-FOUR DECOMPRESSION LUMBAR LAMINECTOMY/MICRODISCECTOMY;POSSIBLE LUMBAR FOUR-FIVE;  Surgeon: Temple Pacini, MD;  Location: [facility] NEURO ORS;  Service: Neurosurgery;  Laterality: Left;  left   right hip surgery  2014   TEE WITHOUT CARDIOVERSION N/A 12/28/2017   Procedure: TRANSESOPHAGEAL ECHOCARDIOGRAM (TEE);  Surgeon: Delight Ovens, MD;  Location: [facility] OR;  Service: Open Heart Surgery;  Laterality: N/A;   TONSILLECTOMY  1970/2006   TOTAL KNEE ARTHROPLASTY Right 09/03/2013   Procedure: RIGHT TOTAL KNEE ARTHROPLASTY;  Surgeon: Dannielle Huh, MD;  Location: [facility] OR;  Service: Orthopedics;  Laterality: Right;    Current Medications: Current Meds  Medication Sig   acetaminophen (TYLENOL) 500 MG tablet Take 1,000 mg by mouth every 6 (six) hours as needed for pain.   allopurinol (ZYLOPRIM) 300 MG tablet Take 150 mg by mouth every morning.    amLODipine (NORVASC) 5 MG tablet Take 5 mg by mouth daily.   aspirin  EC 81 MG tablet Take 1 tablet (81 mg total) by mouth daily. Swallow whole.   atorvastatin (LIPITOR) 20 MG tablet Take 1 tablet by mouth once daily   gabapentin (NEURONTIN) 300 MG capsule Take 300 mg by mouth daily.   irbesartan (AVAPRO) 150 MG tablet Take 150 mg by mouth every morning.   Iron-Vitamin C 100-250 MG TABS Take 100 mg by mouth daily at 12 noon.   Multiple Vitamin (MULTIVITAMIN WITH MINERALS) TABS tablet Take 1 tablet by mouth daily.   nitroGLYCERIN (NITROSTAT) 0.4 MG SL tablet Place 1 tablet (0.4 mg total) under the tongue every 5 (five) minutes as needed for chest pain.   tamsulosin (FLOMAX) 0.4 MG CAPS capsule Take 0.4 mg by mouth daily after supper.  vitamin B-12 (CYANOCOBALAMIN) 500 MCG tablet Take 500 mcg by mouth daily.   vitamin C (ASCORBIC ACID) 500 MG tablet Take 500 mg by mouth daily.     Allergies:   Ativan [lorazepam] and Adhesive [tape]   Social History   Socioeconomic History   Marital status: Married    Spouse name: Not on file   Number of children: Not on file   Years of education: Not on file   Highest education level: Not on file  Occupational History   Occupation: Retired  Tobacco Use   Smoking status: Never   Smokeless tobacco: Never  Vaping Use   Vaping Use: Never used  Substance and Sexual Activity   Alcohol use: No   Drug use: No   Sexual activity: Not Currently  Other Topics Concern   Not on file  Social History Narrative   Not on file   Social Determinants of Health   Financial Resource Strain: Not on file  Food Insecurity: Not on file  Transportation Needs: Not on file  Physical Activity: Not on file  Stress: Not on file  Social Connections: Not on file     Family History: The patient's family history includes Emphysema in his mother; Heart attack (age of onset: 67) in his father; Rheum arthritis in his sister.  ROS:   Please see the history of present illness.    All other systems reviewed and are  negative.  EKGs/Labs/Other Studies Reviewed:    The following studies were reviewed today: EKG reveals sinus rhythm first-degree AV block and poor anterior forces.   Recent Labs: No results found for requested labs within last 365 days.  Recent Lipid Panel    Component Value Date/Time   CHOL 121 10/27/2020 1600   TRIG 84 10/27/2020 1600   HDL 45 10/27/2020 1600   CHOLHDL 2.7 10/27/2020 1600   CHOLHDL 3.0 12/23/2017 0445   VLDL 10 12/23/2017 0445   LDLCALC 59 10/27/2020 1600    Physical Exam:    VS:  BP 128/60   Pulse 70   Ht 5\' 10"  (1.778 m)   Wt 197 lb 3.2 oz (89.4 kg)   SpO2 98%   BMI 28.30 kg/m     Wt Readings from Last 3 Encounters:  11/02/22 197 lb 3.2 oz (89.4 kg)  10/08/21 188 lb 9.6 oz (85.5 kg)  10/27/20 205 lb 3.2 oz (93.1 kg)     GEN: Patient is in no acute distress HEENT: Normal NECK: No JVD; No carotid bruits LYMPHATICS: No lymphadenopathy CARDIAC: Hear sounds regular, 2/6 systolic murmur at the apex. RESPIRATORY:  Clear to auscultation without rales, wheezing or rhonchi  ABDOMEN: Soft, non-tender, non-distended MUSCULOSKELETAL:  No edema; No deformity  SKIN: Warm and dry NEUROLOGIC:  Alert and oriented x 3 PSYCHIATRIC:  Normal affect   Signed, Garwin Brothers, MD  11/02/2022 4:05 PM    [facility]

## 2022-11-09 ENCOUNTER — Ambulatory Visit: Payer: Medicare HMO | Attending: Cardiology

## 2022-11-09 DIAGNOSIS — I2510 Atherosclerotic heart disease of native coronary artery without angina pectoris: Secondary | ICD-10-CM | POA: Diagnosis not present

## 2022-11-09 DIAGNOSIS — I10 Essential (primary) hypertension: Secondary | ICD-10-CM | POA: Diagnosis not present

## 2022-11-09 DIAGNOSIS — Z1321 Encounter for screening for nutritional disorder: Secondary | ICD-10-CM | POA: Diagnosis not present

## 2022-11-09 LAB — ECHOCARDIOGRAM COMPLETE
AR max vel: 2.44 cm2
AV Area VTI: 2.28 cm2
AV Area mean vel: 2.25 cm2
AV Mean grad: 5.5 mmHg
AV Peak grad: 10.2 mmHg
Ao pk vel: 1.6 m/s
Area-P 1/2: 2.82 cm2
MV M vel: 5.07 m/s
MV Peak grad: 102.8 mmHg
P 1/2 time: 588 msec
Radius: 0.5 cm
S' Lateral: 3 cm

## 2022-11-10 LAB — COMPREHENSIVE METABOLIC PANEL
ALT: 11 IU/L (ref 0–44)
AST: 15 IU/L (ref 0–40)
Albumin: 4.2 g/dL (ref 3.7–4.7)
Alkaline Phosphatase: 83 IU/L (ref 44–121)
BUN/Creatinine Ratio: 13 (ref 10–24)
BUN: 24 mg/dL (ref 8–27)
Bilirubin Total: 0.5 mg/dL (ref 0.0–1.2)
CO2: 24 mmol/L (ref 20–29)
Calcium: 9.2 mg/dL (ref 8.6–10.2)
Chloride: 104 mmol/L (ref 96–106)
Creatinine, Ser: 1.82 mg/dL — ABNORMAL HIGH (ref 0.76–1.27)
Globulin, Total: 2.3 g/dL (ref 1.5–4.5)
Glucose: 90 mg/dL (ref 70–99)
Potassium: 5.3 mmol/L — ABNORMAL HIGH (ref 3.5–5.2)
Sodium: 139 mmol/L (ref 134–144)
Total Protein: 6.5 g/dL (ref 6.0–8.5)
eGFR: 35 mL/min/{1.73_m2} — ABNORMAL LOW (ref >59)

## 2022-11-10 LAB — LIPID PANEL
Chol/HDL Ratio: 2.5 ratio (ref 0.0–5.0)
Cholesterol, Total: 117 mg/dL (ref 100–199)
HDL: 47 mg/dL (ref >39)
LDL Chol Calc (NIH): 57 mg/dL (ref 0–99)
Triglycerides: 59 mg/dL (ref 0–149)
VLDL Cholesterol Cal: 13 mg/dL (ref 5–40)

## 2022-11-10 LAB — TSH: TSH: 2.88 u[IU]/mL (ref 0.450–4.500)

## 2022-11-10 LAB — CBC
Hematocrit: 36.5 % — ABNORMAL LOW (ref 37.5–51.0)
Hemoglobin: 12 g/dL — ABNORMAL LOW (ref 13.0–17.7)
MCH: 31.8 pg (ref 26.6–33.0)
MCHC: 32.9 g/dL (ref 31.5–35.7)
MCV: 97 fL (ref 79–97)
Platelets: 116 10*3/uL — ABNORMAL LOW (ref 150–450)
RBC: 3.77 x10E6/uL — ABNORMAL LOW (ref 4.14–5.80)
RDW: 13.4 % (ref 11.6–15.4)
WBC: 6.3 10*3/uL (ref 3.4–10.8)

## 2022-11-10 LAB — VITAMIN B12: Vitamin B-12: 485 pg/mL (ref 232–1245)

## 2022-11-10 LAB — VITAMIN D 25 HYDROXY (VIT D DEFICIENCY, FRACTURES): Vit D, 25-Hydroxy: 36.4 ng/mL (ref 30.0–100.0)

## 2022-11-11 DIAGNOSIS — B351 Tinea unguium: Secondary | ICD-10-CM | POA: Diagnosis not present

## 2022-11-11 DIAGNOSIS — M79674 Pain in right toe(s): Secondary | ICD-10-CM | POA: Diagnosis not present

## 2022-11-11 DIAGNOSIS — M79675 Pain in left toe(s): Secondary | ICD-10-CM | POA: Diagnosis not present

## 2022-11-22 DIAGNOSIS — C44219 Basal cell carcinoma of skin of left ear and external auricular canal: Secondary | ICD-10-CM | POA: Diagnosis not present

## 2022-12-01 DIAGNOSIS — N401 Enlarged prostate with lower urinary tract symptoms: Secondary | ICD-10-CM | POA: Diagnosis not present

## 2022-12-01 DIAGNOSIS — R339 Retention of urine, unspecified: Secondary | ICD-10-CM | POA: Diagnosis not present

## 2022-12-17 DIAGNOSIS — U071 COVID-19: Secondary | ICD-10-CM | POA: Diagnosis not present

## 2022-12-17 DIAGNOSIS — Z6833 Body mass index (BMI) 33.0-33.9, adult: Secondary | ICD-10-CM | POA: Diagnosis not present

## 2022-12-17 DIAGNOSIS — R030 Elevated blood-pressure reading, without diagnosis of hypertension: Secondary | ICD-10-CM | POA: Diagnosis not present

## 2022-12-17 DIAGNOSIS — E669 Obesity, unspecified: Secondary | ICD-10-CM | POA: Diagnosis not present

## 2022-12-27 ENCOUNTER — Other Ambulatory Visit: Payer: Self-pay | Admitting: Cardiology

## 2022-12-27 DIAGNOSIS — Z6829 Body mass index (BMI) 29.0-29.9, adult: Secondary | ICD-10-CM | POA: Diagnosis not present

## 2022-12-27 DIAGNOSIS — J329 Chronic sinusitis, unspecified: Secondary | ICD-10-CM | POA: Diagnosis not present

## 2023-01-04 DIAGNOSIS — C44219 Basal cell carcinoma of skin of left ear and external auricular canal: Secondary | ICD-10-CM | POA: Diagnosis not present

## 2023-01-04 DIAGNOSIS — D485 Neoplasm of uncertain behavior of skin: Secondary | ICD-10-CM | POA: Diagnosis not present

## 2023-01-07 DIAGNOSIS — S62601B Fracture of unspecified phalanx of left index finger, initial encounter for open fracture: Secondary | ICD-10-CM | POA: Diagnosis not present

## 2023-01-07 DIAGNOSIS — W312XXA Contact with powered woodworking and forming machines, initial encounter: Secondary | ICD-10-CM | POA: Diagnosis not present

## 2023-01-08 DIAGNOSIS — S62601B Fracture of unspecified phalanx of left index finger, initial encounter for open fracture: Secondary | ICD-10-CM | POA: Diagnosis not present

## 2023-01-08 DIAGNOSIS — S62631A Displaced fracture of distal phalanx of left index finger, initial encounter for closed fracture: Secondary | ICD-10-CM | POA: Diagnosis not present

## 2023-01-08 DIAGNOSIS — E669 Obesity, unspecified: Secondary | ICD-10-CM | POA: Diagnosis not present

## 2023-01-08 DIAGNOSIS — S61311A Laceration without foreign body of left index finger with damage to nail, initial encounter: Secondary | ICD-10-CM | POA: Diagnosis not present

## 2023-01-08 DIAGNOSIS — S62631B Displaced fracture of distal phalanx of left index finger, initial encounter for open fracture: Secondary | ICD-10-CM | POA: Diagnosis not present

## 2023-01-08 DIAGNOSIS — Z6832 Body mass index (BMI) 32.0-32.9, adult: Secondary | ICD-10-CM | POA: Diagnosis not present

## 2023-01-08 DIAGNOSIS — W312XXA Contact with powered woodworking and forming machines, initial encounter: Secondary | ICD-10-CM | POA: Diagnosis not present

## 2023-01-08 DIAGNOSIS — R030 Elevated blood-pressure reading, without diagnosis of hypertension: Secondary | ICD-10-CM | POA: Diagnosis not present

## 2023-01-11 DIAGNOSIS — S6992XA Unspecified injury of left wrist, hand and finger(s), initial encounter: Secondary | ICD-10-CM | POA: Diagnosis not present

## 2023-01-12 DIAGNOSIS — R339 Retention of urine, unspecified: Secondary | ICD-10-CM | POA: Diagnosis not present

## 2023-01-12 DIAGNOSIS — N401 Enlarged prostate with lower urinary tract symptoms: Secondary | ICD-10-CM | POA: Diagnosis not present

## 2023-01-13 DIAGNOSIS — R0981 Nasal congestion: Secondary | ICD-10-CM | POA: Diagnosis not present

## 2023-01-13 DIAGNOSIS — Z6829 Body mass index (BMI) 29.0-29.9, adult: Secondary | ICD-10-CM | POA: Diagnosis not present

## 2023-01-18 DIAGNOSIS — M79674 Pain in right toe(s): Secondary | ICD-10-CM | POA: Diagnosis not present

## 2023-01-18 DIAGNOSIS — B351 Tinea unguium: Secondary | ICD-10-CM | POA: Diagnosis not present

## 2023-01-18 DIAGNOSIS — M79675 Pain in left toe(s): Secondary | ICD-10-CM | POA: Diagnosis not present

## 2023-01-25 DIAGNOSIS — S6992XA Unspecified injury of left wrist, hand and finger(s), initial encounter: Secondary | ICD-10-CM | POA: Diagnosis not present

## 2023-02-15 DIAGNOSIS — J329 Chronic sinusitis, unspecified: Secondary | ICD-10-CM | POA: Diagnosis not present

## 2023-02-15 DIAGNOSIS — Z6830 Body mass index (BMI) 30.0-30.9, adult: Secondary | ICD-10-CM | POA: Diagnosis not present

## 2023-03-16 DIAGNOSIS — N401 Enlarged prostate with lower urinary tract symptoms: Secondary | ICD-10-CM | POA: Diagnosis not present

## 2023-03-16 DIAGNOSIS — R339 Retention of urine, unspecified: Secondary | ICD-10-CM | POA: Diagnosis not present

## 2023-04-07 DIAGNOSIS — C44229 Squamous cell carcinoma of skin of left ear and external auricular canal: Secondary | ICD-10-CM | POA: Diagnosis not present

## 2023-04-07 DIAGNOSIS — L578 Other skin changes due to chronic exposure to nonionizing radiation: Secondary | ICD-10-CM | POA: Diagnosis not present

## 2023-04-12 DIAGNOSIS — F32A Depression, unspecified: Secondary | ICD-10-CM | POA: Diagnosis not present

## 2023-04-12 DIAGNOSIS — H6593 Unspecified nonsuppurative otitis media, bilateral: Secondary | ICD-10-CM | POA: Diagnosis not present

## 2023-04-12 DIAGNOSIS — Z6831 Body mass index (BMI) 31.0-31.9, adult: Secondary | ICD-10-CM | POA: Diagnosis not present

## 2023-04-19 DIAGNOSIS — B351 Tinea unguium: Secondary | ICD-10-CM | POA: Diagnosis not present

## 2023-04-19 DIAGNOSIS — M79675 Pain in left toe(s): Secondary | ICD-10-CM | POA: Diagnosis not present

## 2023-04-19 DIAGNOSIS — M79674 Pain in right toe(s): Secondary | ICD-10-CM | POA: Diagnosis not present

## 2023-04-28 DIAGNOSIS — Z6833 Body mass index (BMI) 33.0-33.9, adult: Secondary | ICD-10-CM | POA: Diagnosis not present

## 2023-04-28 DIAGNOSIS — U071 COVID-19: Secondary | ICD-10-CM | POA: Diagnosis not present

## 2023-04-28 DIAGNOSIS — J019 Acute sinusitis, unspecified: Secondary | ICD-10-CM | POA: Diagnosis not present

## 2023-04-28 DIAGNOSIS — J101 Influenza due to other identified influenza virus with other respiratory manifestations: Secondary | ICD-10-CM | POA: Diagnosis not present

## 2023-04-28 DIAGNOSIS — E669 Obesity, unspecified: Secondary | ICD-10-CM | POA: Diagnosis not present

## 2023-05-01 DIAGNOSIS — E785 Hyperlipidemia, unspecified: Secondary | ICD-10-CM | POA: Diagnosis not present

## 2023-05-01 DIAGNOSIS — M6282 Rhabdomyolysis: Secondary | ICD-10-CM | POA: Diagnosis not present

## 2023-05-01 DIAGNOSIS — N189 Chronic kidney disease, unspecified: Secondary | ICD-10-CM | POA: Diagnosis not present

## 2023-05-01 DIAGNOSIS — I129 Hypertensive chronic kidney disease with stage 1 through stage 4 chronic kidney disease, or unspecified chronic kidney disease: Secondary | ICD-10-CM | POA: Diagnosis not present

## 2023-05-01 DIAGNOSIS — N183 Chronic kidney disease, stage 3 unspecified: Secondary | ICD-10-CM | POA: Diagnosis not present

## 2023-05-01 DIAGNOSIS — R0602 Shortness of breath: Secondary | ICD-10-CM | POA: Diagnosis not present

## 2023-05-01 DIAGNOSIS — Z79899 Other long term (current) drug therapy: Secondary | ICD-10-CM | POA: Diagnosis not present

## 2023-05-01 DIAGNOSIS — U071 COVID-19: Secondary | ICD-10-CM | POA: Diagnosis not present

## 2023-05-01 DIAGNOSIS — R0902 Hypoxemia: Secondary | ICD-10-CM | POA: Diagnosis not present

## 2023-05-01 DIAGNOSIS — I2510 Atherosclerotic heart disease of native coronary artery without angina pectoris: Secondary | ICD-10-CM | POA: Diagnosis not present

## 2023-05-01 DIAGNOSIS — Z951 Presence of aortocoronary bypass graft: Secondary | ICD-10-CM | POA: Diagnosis not present

## 2023-05-02 DIAGNOSIS — U071 COVID-19: Secondary | ICD-10-CM | POA: Diagnosis not present

## 2023-05-02 DIAGNOSIS — R0902 Hypoxemia: Secondary | ICD-10-CM | POA: Diagnosis not present

## 2023-05-02 DIAGNOSIS — M6282 Rhabdomyolysis: Secondary | ICD-10-CM | POA: Diagnosis not present

## 2023-05-09 DIAGNOSIS — R0602 Shortness of breath: Secondary | ICD-10-CM | POA: Diagnosis not present

## 2023-05-09 DIAGNOSIS — Z6830 Body mass index (BMI) 30.0-30.9, adult: Secondary | ICD-10-CM | POA: Diagnosis not present

## 2023-05-16 DIAGNOSIS — N390 Urinary tract infection, site not specified: Secondary | ICD-10-CM | POA: Diagnosis not present

## 2023-06-09 DIAGNOSIS — H6593 Unspecified nonsuppurative otitis media, bilateral: Secondary | ICD-10-CM | POA: Diagnosis not present

## 2023-06-09 DIAGNOSIS — R002 Palpitations: Secondary | ICD-10-CM | POA: Diagnosis not present

## 2023-06-09 DIAGNOSIS — Z6830 Body mass index (BMI) 30.0-30.9, adult: Secondary | ICD-10-CM | POA: Diagnosis not present

## 2023-06-17 DIAGNOSIS — E785 Hyperlipidemia, unspecified: Secondary | ICD-10-CM | POA: Diagnosis not present

## 2023-06-17 DIAGNOSIS — R55 Syncope and collapse: Secondary | ICD-10-CM | POA: Diagnosis not present

## 2023-06-17 DIAGNOSIS — I2510 Atherosclerotic heart disease of native coronary artery without angina pectoris: Secondary | ICD-10-CM | POA: Diagnosis not present

## 2023-06-17 DIAGNOSIS — N400 Enlarged prostate without lower urinary tract symptoms: Secondary | ICD-10-CM | POA: Diagnosis not present

## 2023-06-17 DIAGNOSIS — R42 Dizziness and giddiness: Secondary | ICD-10-CM | POA: Diagnosis not present

## 2023-06-17 DIAGNOSIS — J690 Pneumonitis due to inhalation of food and vomit: Secondary | ICD-10-CM | POA: Diagnosis not present

## 2023-06-17 DIAGNOSIS — N179 Acute kidney failure, unspecified: Secondary | ICD-10-CM | POA: Diagnosis not present

## 2023-06-17 DIAGNOSIS — R6510 Systemic inflammatory response syndrome (SIRS) of non-infectious origin without acute organ dysfunction: Secondary | ICD-10-CM | POA: Diagnosis not present

## 2023-06-17 DIAGNOSIS — I959 Hypotension, unspecified: Secondary | ICD-10-CM | POA: Diagnosis not present

## 2023-06-17 DIAGNOSIS — E860 Dehydration: Secondary | ICD-10-CM | POA: Diagnosis not present

## 2023-06-17 DIAGNOSIS — I129 Hypertensive chronic kidney disease with stage 1 through stage 4 chronic kidney disease, or unspecified chronic kidney disease: Secondary | ICD-10-CM | POA: Diagnosis not present

## 2023-06-17 DIAGNOSIS — J189 Pneumonia, unspecified organism: Secondary | ICD-10-CM | POA: Diagnosis not present

## 2023-06-17 DIAGNOSIS — R5383 Other fatigue: Secondary | ICD-10-CM | POA: Diagnosis not present

## 2023-06-17 DIAGNOSIS — R6883 Chills (without fever): Secondary | ICD-10-CM | POA: Diagnosis not present

## 2023-06-17 DIAGNOSIS — R531 Weakness: Secondary | ICD-10-CM | POA: Diagnosis not present

## 2023-06-17 DIAGNOSIS — N183 Chronic kidney disease, stage 3 unspecified: Secondary | ICD-10-CM | POA: Diagnosis not present

## 2023-06-18 DIAGNOSIS — N179 Acute kidney failure, unspecified: Secondary | ICD-10-CM | POA: Diagnosis not present

## 2023-06-18 DIAGNOSIS — J690 Pneumonitis due to inhalation of food and vomit: Secondary | ICD-10-CM | POA: Diagnosis not present

## 2023-06-18 DIAGNOSIS — E860 Dehydration: Secondary | ICD-10-CM | POA: Diagnosis not present

## 2023-06-19 DIAGNOSIS — N179 Acute kidney failure, unspecified: Secondary | ICD-10-CM | POA: Diagnosis not present

## 2023-06-19 DIAGNOSIS — E860 Dehydration: Secondary | ICD-10-CM | POA: Diagnosis not present

## 2023-06-19 DIAGNOSIS — J690 Pneumonitis due to inhalation of food and vomit: Secondary | ICD-10-CM | POA: Diagnosis not present

## 2023-06-20 DIAGNOSIS — N179 Acute kidney failure, unspecified: Secondary | ICD-10-CM | POA: Diagnosis not present

## 2023-06-20 DIAGNOSIS — J690 Pneumonitis due to inhalation of food and vomit: Secondary | ICD-10-CM | POA: Diagnosis not present

## 2023-06-20 DIAGNOSIS — E860 Dehydration: Secondary | ICD-10-CM | POA: Diagnosis not present

## 2023-06-21 DIAGNOSIS — R002 Palpitations: Secondary | ICD-10-CM | POA: Diagnosis not present

## 2023-06-27 DIAGNOSIS — N179 Acute kidney failure, unspecified: Secondary | ICD-10-CM | POA: Diagnosis not present

## 2023-06-27 DIAGNOSIS — Z8701 Personal history of pneumonia (recurrent): Secondary | ICD-10-CM | POA: Diagnosis not present

## 2023-06-27 DIAGNOSIS — G4700 Insomnia, unspecified: Secondary | ICD-10-CM | POA: Diagnosis not present

## 2023-06-27 DIAGNOSIS — Z6831 Body mass index (BMI) 31.0-31.9, adult: Secondary | ICD-10-CM | POA: Diagnosis not present

## 2023-06-27 DIAGNOSIS — J3089 Other allergic rhinitis: Secondary | ICD-10-CM | POA: Diagnosis not present

## 2023-06-29 DIAGNOSIS — M79674 Pain in right toe(s): Secondary | ICD-10-CM | POA: Diagnosis not present

## 2023-06-29 DIAGNOSIS — M79675 Pain in left toe(s): Secondary | ICD-10-CM | POA: Diagnosis not present

## 2023-06-29 DIAGNOSIS — B351 Tinea unguium: Secondary | ICD-10-CM | POA: Diagnosis not present

## 2023-07-12 DIAGNOSIS — N401 Enlarged prostate with lower urinary tract symptoms: Secondary | ICD-10-CM | POA: Diagnosis not present

## 2023-07-12 DIAGNOSIS — R339 Retention of urine, unspecified: Secondary | ICD-10-CM | POA: Diagnosis not present

## 2023-07-13 ENCOUNTER — Ambulatory Visit: Payer: Medicare HMO | Attending: Cardiology | Admitting: Cardiology

## 2023-07-13 ENCOUNTER — Encounter: Payer: Self-pay | Admitting: Cardiology

## 2023-07-13 VITALS — BP 138/74 | HR 72 | Ht 66.6 in | Wt 204.6 lb

## 2023-07-13 DIAGNOSIS — Z951 Presence of aortocoronary bypass graft: Secondary | ICD-10-CM

## 2023-07-13 DIAGNOSIS — I340 Nonrheumatic mitral (valve) insufficiency: Secondary | ICD-10-CM | POA: Diagnosis not present

## 2023-07-13 DIAGNOSIS — E785 Hyperlipidemia, unspecified: Secondary | ICD-10-CM

## 2023-07-13 DIAGNOSIS — I4729 Other ventricular tachycardia: Secondary | ICD-10-CM

## 2023-07-13 DIAGNOSIS — I2510 Atherosclerotic heart disease of native coronary artery without angina pectoris: Secondary | ICD-10-CM

## 2023-07-13 DIAGNOSIS — I10 Essential (primary) hypertension: Secondary | ICD-10-CM | POA: Diagnosis not present

## 2023-07-13 NOTE — Patient Instructions (Addendum)
 Medication Instructions:  Your physician recommends that you continue on your current medications as directed. Please refer to the Current Medication list given to you today.  *If you need a refill on your cardiac medications before your next appointment, please call your pharmacy*   Lab Work: Your physician recommends that you have a BMP and magnesium today in the office.  If you have labs (blood work) drawn today and your tests are completely normal, you will receive your results only by: MyChart Message (if you have MyChart) OR A paper copy in the mail If you have any lab test that is abnormal or we need to change your treatment, we will call you to review the results.   Testing/Procedures: You are scheduled for a Myocardial Perfusion Imaging Study.  Please arrive 15 minutes prior to your appointment time for registration and insurance purposes.  The test will take approximately 3 to 4 hours to complete; you may bring reading material.  If someone comes with you to your appointment, they will need to remain in the main lobby due to limited space in the testing area.   How to prepare for your Myocardial Perfusion Test: Do not eat or drink 3 hours prior to your test, except you may have water. Do not consume products containing caffeine (regular or decaffeinated) 12 hours prior to your test. (ex: coffee, chocolate, sodas, tea). Do bring a list of your current medications with you.  If not listed below, you may take your medications as normal. Do wear comfortable clothes (no dresses or overalls) and walking shoes, tennis shoes preferred (No heels or open toe shoes are allowed). Do NOT wear cologne, perfume, aftershave, or lotions (deodorant is allowed). If these instructions are not followed, your test will have to be rescheduled.  If you cannot keep your appointment, please provide 24 hours notification to the Nuclear Lab, to avoid a possible $50 charge to your account.  Follow-Up: At  [facility], you and your health needs are our priority.  As part of our continuing mission to provide you with exceptional heart care, we have created designated Provider Care Teams.  These Care Teams include your primary Cardiologist (physician) and Advanced Practice Providers (APPs -  Physician Assistants and Nurse Practitioners) who all work together to provide you with the care you need, when you need it.  We recommend signing up for the patient portal called "MyChart".  Sign up information is provided on this After Visit Summary.  MyChart is used to connect with patients for Virtual Visits (Telemedicine).  Patients are able to view lab/test results, encounter notes, upcoming appointments, etc.  Non-urgent messages can be sent to your provider as well.   To learn more about what you can do with MyChart, go to [email or web address].    Your next appointment:   9 month(s)  Provider:   Belva Crome, MD  Other Instructions  Cardiac Nuclear Scan A cardiac nuclear scan is a test that is done to check the flow of blood to your heart. It is done when you are resting and when you are exercising. The test looks for problems such as: Not enough blood reaching a portion of the heart. The heart muscle not working as it should. You may need this test if you have: Heart disease. Lab results that are not normal. Had heart surgery or a balloon procedure to open up blocked arteries (angioplasty) or a small mesh tube (stent). Chest pain. Shortness of breath. Had a heart attack. In  this test, a special dye (tracer) is put into your bloodstream. The tracer will travel to your heart. A camera will then take pictures of your heart to see how the tracer moves through your heart. This test is usually done at a hospital and takes 2-4 hours. Tell a doctor about: Any allergies you have. All medicines you are taking, including vitamins, herbs, eye drops, creams, and over-the-counter medicines. Any  bleeding problems you have. Any surgeries you have had. Any medical conditions you have. Whether you are pregnant or may be pregnant. Any history of asthma or long-term (chronic) lung disease. Any history of heart rhythm disorders or heart valve conditions. What are the risks? Your doctor will talk with you about risks. These may include: Serious chest pain and heart attack. This is only a risk if the stress portion of the test is done. Fast or uneven heartbeats (palpitations). A feeling of warmth in your chest. This feeling usually does not last long. Allergic reaction to the tracer. Shortness of breath or trouble breathing. What happens before the test? Ask your doctor about changing or stopping your normal medicines. Follow instructions from your doctor about what you cannot eat or drink. Remove your jewelry on the day of the test. Ask your doctor if you need to avoid nicotine or caffeine. What happens during the test? An IV tube will be inserted into one of your veins. Your doctor will give you a small amount of tracer through the IV tube. You will wait for 20-40 minutes while the tracer moves through your bloodstream. Your heart will be monitored with an electrocardiogram (ECG). You will lie down on an exam table. Pictures of your heart will be taken for about 15-20 minutes. You may also have a stress test. For this test, one of these things may be done: You will be asked to exercise on a treadmill or a stationary bike. You will be given medicines that will make your heart work harder. This is done if you are unable to exercise. When blood flow to your heart has peaked, a tracer will again be given through the IV tube. After 20-40 minutes, you will get back on the exam table. More pictures will be taken of your heart. Depending on the tracer that is used, more pictures may need to be taken 3-4 hours later. Your IV tube will be removed when the test is over. The test may vary  among doctors and hospitals. What happens after the test? Ask your doctor: Whether you can return to your normal schedule, including diet, activities, travel, and medicines. Whether you should drink more fluids. This will help to remove the tracer from your body. Ask your doctor, or the department that is doing the test: When will my results be ready? How will I get my results? What are my treatment options? What other tests do I need? What are my next steps? This information is not intended to replace advice given to you by your health care provider. Make sure you discuss any questions you have with your health care provider. Document Revised: 09/29/2021 Document Reviewed: 09/29/2021 Elsevier Patient Education  2023 [employer].

## 2023-07-13 NOTE — Progress Notes (Signed)
 Cardiology Office Note:    Date:  07/13/2023   ID:  Master Touchet, DOB Jul 31, 1931, MRN [id number]  PCP:  Street, Stephanie Coup, MD  Cardiologist:  Garwin Brothers, MD   Referring MD: [address] Stephanie Coup, *    ASSESSMENT:    1. Coronary artery disease involving native coronary artery of native heart without angina pectoris   2. Primary hypertension   3. S/P CABG x 4   4. Hyperlipidemia with target LDL less than 70   5. Moderate mitral regurgitation    PLAN:    In order of problems listed above:  Coronary artery disease and nonsustained ventricular tachycardia: Secondary prevention stressed.  Importance of compliance with diet medication stressed any vocalized understanding.  He is asymptomatic at this time but will be of coronary artery disease we will evaluate him for his ischemic substrate.  He is agreeable and a Lexiscan sestamibi.  Will also check his electrolytes notably Chem-7 and magnesium.  I am not keen on putting him on a beta-blocker because of slow heart rates at times.  And also because of his advanced age Essential hypertension: Blood pressure is stable and diet was emphasized.  Salt intake issues diet lifestyle modification urged Mixed dyslipidemia: On lipid-lowering medications followed by primary care. Moderate mitral regurgitation: Stable and we will continue to monitor. Patient will be seen in follow-up appointment in 9 months or earlier if the patient has any concerns.    Medication Adjustments/Labs and Tests Ordered: Current medicines are reviewed at length with the patient today.  Concerns regarding medicines are outlined above.  Orders Placed This Encounter  Procedures   EKG 12-Lead   No orders of the defined types were placed in this encounter.    No chief complaint on file.    History of Present Illness:    Kenyen Candy is a 89 y.o. male.  Patient is an active gentleman and PSA than stated age.  He has past medical history of coronary artery  disease, essential hypertension, moderate mitral regurgitation, dyslipidemia.  He denies any problems at this time and takes care of activities of daily living.  No chest pain orthopnea or PND.  He is monitoring.  Nonsustained ventricular tachycardia and so he sent here for evaluation.  No syncope or palpitations or any such symptoms.  At the time of my evaluation, the patient is alert awake oriented and in no distress.  Past Medical History:  Diagnosis Date   Acute pain of right shoulder 02/24/2015   Acute renal failure with acute tubular necrosis superimposed on stage 3 chronic kidney disease (HCC)    Anemia    takes Iron pill daily   Arthritis    Cancer (HCC) 02/14/13   skin cancer   Cataract    right eye  and immature   Chest pain with high risk for cardiac etiology, abnormal Myoview 12/22/2017   Closed displaced fracture of neck of left fifth metacarpal bone 02/24/2015   Constipation    Coronary artery disease 12/28/2017   Depression    no meds required   Displaced intertrochanteric fracture of right femur (HCC) 02/14/2013   Enlarged prostate    takes Flomax daily   Gout    takes Allopurinol daily   Hemorrhoids    occasionally   History of blood transfusion    lap - colon blockage   History of bronchitis    last time in Dec 2014   History of colon polyps    History of UTI  takes Trimethoprim daily   Hyperlipidemia with target LDL less than 70    Hypertension    takes Amlodipine and Avapro daily   Ileus (HCC) 02/16/2013   Impaired hearing    wears hearing aids   Insomnia    takes Restoril nightly   Joint pain    Lumbar stenosis with neurogenic claudication 06/01/2013   Neuromuscular disorder (HCC) 02/14/13   damnage nerves in bil feet   Neuropathy    Pain of left hand 02/24/2015   Peripheral neuropathy    Presence of artificial knee joint 08/17/2012   Renal calculus    large   Renal insufficiency 01/31/2018   Rupture of left proximal biceps tendon 04/05/2019   S/P CABG  x 4 12/28/2017   LIMA to LAD SVG to DIAGONAL SVG to CIRCUMFLEX SVG to PDA   S/P total knee arthroplasty 09/03/2013   Shortness of breath    with exertion   Spinal stenosis of lumbar region 02/14/2013   Spinal stenosis, lumbar region, with neurogenic claudication 06/01/2013   Trigger middle finger of left hand 03/18/2016   Urinary retention 02/20/2013    Past Surgical History:  Procedure Laterality Date   APPENDECTOMY  1964   basal cell removed from back and face   2005   basal cell removed from cheek and nose  2009   basal cell removed from left ear  2014   COLONOSCOPY  2010   CORONARY ARTERY BYPASS GRAFT N/A 12/28/2017   Procedure: CORONARY ARTERY BYPASS GRAFTING (CABG) USING LEFT INTERNAL MAMMARY ARTERY TO LAD  AND ENDOSCOPICALLY HARVESTED RIGHT SAPHENOUS VEIN TO DIAGONAL, CIRC AND PDA;  Surgeon: Delight Ovens, MD;  Location: [facility] OR;  Service: Open Heart Surgery;  Laterality: N/A;   ESOPHAGOGASTRODUODENOSCOPY     HEMORRHOID SURGERY  1970/2006   INTRAMEDULLARY (IM) NAIL INTERTROCHANTERIC Right 02/15/2013   Procedure: INTRAMEDULLARY (IM) NAIL INTERTROCHANTRIC;  Surgeon: Budd Palmer, MD;  Location: [facility] OR;  Service: Orthopedics;  Laterality: Right;   JOINT REPLACEMENT Left 09   knee   laparoscopic surgery   2006   d/t blockage in intestine    LEFT HEART CATH AND CORONARY ANGIOGRAPHY N/A 12/23/2017   Procedure: LEFT HEART CATH AND CORONARY ANGIOGRAPHY;  Surgeon: Swaziland, Peter M, MD;  Location: [facility] INVASIVE CV LAB;  Service: Cardiovascular;  Laterality: N/A;   LUMBAR LAMINECTOMY/DECOMPRESSION MICRODISCECTOMY Left 06/01/2013   Procedure: LEFT LUMBAR THREE-FOUR DECOMPRESSION LUMBAR LAMINECTOMY/MICRODISCECTOMY;POSSIBLE LUMBAR FOUR-FIVE;  Surgeon: Temple Pacini, MD;  Location: [facility] NEURO ORS;  Service: Neurosurgery;  Laterality: Left;  left   right hip surgery  2014   TEE WITHOUT CARDIOVERSION N/A 12/28/2017   Procedure: TRANSESOPHAGEAL ECHOCARDIOGRAM (TEE);  Surgeon: Delight Ovens, MD;   Location: [facility] OR;  Service: Open Heart Surgery;  Laterality: N/A;   TONSILLECTOMY  1970/2006   TOTAL KNEE ARTHROPLASTY Right 09/03/2013   Procedure: RIGHT TOTAL KNEE ARTHROPLASTY;  Surgeon: Dannielle Huh, MD;  Location: [facility] OR;  Service: Orthopedics;  Laterality: Right;    Current Medications: Current Meds  Medication Sig   acetaminophen (TYLENOL) 500 MG tablet Take 1,000 mg by mouth every 6 (six) hours as needed for pain.   allopurinol (ZYLOPRIM) 300 MG tablet Take 150 mg by mouth every morning.    amLODipine (NORVASC) 5 MG tablet Take 5 mg by mouth daily.   atorvastatin (LIPITOR) 20 MG tablet Take 1 tablet (20 mg total) by mouth daily.   bethanechol (URECHOLINE) 25 MG tablet Take 25 mg by mouth daily.   gabapentin (NEURONTIN)  300 MG capsule Take 300 mg by mouth 2 (two) times daily.   irbesartan (AVAPRO) 150 MG tablet Take 150 mg by mouth every morning.   Iron-Vitamin C 100-250 MG TABS Take 100 mg by mouth daily at 12 noon.   Multiple Vitamin (MULTIVITAMIN WITH MINERALS) TABS tablet Take 1 tablet by mouth daily.   nitroGLYCERIN (NITROSTAT) 0.4 MG SL tablet Place 1 tablet (0.4 mg total) under the tongue every 5 (five) minutes as needed for chest pain.   tamsulosin (FLOMAX) 0.4 MG CAPS capsule Take 0.4 mg by mouth daily after supper.    vitamin B-12 (CYANOCOBALAMIN) 500 MCG tablet Take 500 mcg by mouth daily.   vitamin C (ASCORBIC ACID) 500 MG tablet Take 500 mg by mouth daily.     Allergies:   Ativan [lorazepam] and Adhesive [tape]   Social History   Socioeconomic History   Marital status: Married    Spouse name: Not on file   Number of children: Not on file   Years of education: Not on file   Highest education level: Not on file  Occupational History   Occupation: Retired  Tobacco Use   Smoking status: Never   Smokeless tobacco: Never  Vaping Use   Vaping status: Never Used  Substance and Sexual Activity   Alcohol use: No   Drug use: No   Sexual activity: Not Currently  Other  Topics Concern   Not on file  Social History Narrative   Not on file   Social Drivers of Health   Financial Resource Strain: Not on file  Food Insecurity: Not on file  Transportation Needs: Not on file  Physical Activity: Not on file  Stress: Not on file  Social Connections: Not on file     Family History: The patient's family history includes Emphysema in his mother; Heart attack (age of onset: 69) in his father; Rheum arthritis in his sister.  ROS:   Please see the history of present illness.    All other systems reviewed and are negative.  EKGs/Labs/Other Studies Reviewed:    The following studies were reviewed today: .Marland KitchenEKG Interpretation Date/Time:  Wednesday July 13 2023 11:05:20 EST Ventricular Rate:  72 PR Interval:  208 QRS Duration:  70 QT Interval:  386 QTC Calculation: 422 R Axis:   50  Text Interpretation: Sinus rhythm with Premature atrial complexes Cannot rule out Anterior infarct , age undetermined Abnormal ECG When compared with ECG of 29-Dec-2017 07:27, Premature atrial complexes are now Present Minimal criteria for Anterior infarct are now Present Nonspecific T wave abnormality no longer evident in Inferior leads Nonspecific T wave abnormality no longer evident in Lateral leads Confirmed by Belva Crome [phone]) on 07/13/2023 11:31:03 AM     Recent Labs: 11/09/2022: ALT 11; BUN 24; Creatinine, Ser 1.82; Hemoglobin 12.0; Platelets 116; Potassium 5.3; Sodium 139; TSH 2.880  Recent Lipid Panel    Component Value Date/Time   CHOL 117 11/09/2022 0857   TRIG 59 11/09/2022 0857   HDL 47 11/09/2022 0857   CHOLHDL 2.5 11/09/2022 0857   CHOLHDL 3.0 12/23/2017 0445   VLDL 10 12/23/2017 0445   LDLCALC 57 11/09/2022 0857    Physical Exam:    VS:  BP 138/74   Pulse 72   Ht 5' 6.6" (1.692 m)   Wt 204 lb 9.6 oz (92.8 kg)   SpO2 96%   BMI 32.43 kg/m     Wt Readings from Last 3 Encounters:  07/13/23 204 lb 9.6 oz (92.8 kg)  11/02/22 197 lb 3.2 oz  (89.4 kg)  10/08/21 188 lb 9.6 oz (85.5 kg)     GEN: Patient is in no acute distress HEENT: Normal NECK: No JVD; No carotid bruits LYMPHATICS: No lymphadenopathy CARDIAC: Hear sounds regular, 2/6 systolic murmur at the apex. RESPIRATORY:  Clear to auscultation without rales, wheezing or rhonchi  ABDOMEN: Soft, non-tender, non-distended MUSCULOSKELETAL:  No edema; No deformity  SKIN: Warm and dry NEUROLOGIC:  Alert and oriented x 3 PSYCHIATRIC:  Normal affect   Signed, Garwin Brothers, MD  07/13/2023 11:32 AM    [facility]

## 2023-07-14 ENCOUNTER — Telehealth: Payer: Self-pay

## 2023-07-14 LAB — BASIC METABOLIC PANEL
BUN/Creatinine Ratio: 15 (ref 10–24)
BUN: 23 mg/dL (ref 10–36)
CO2: 21 mmol/L (ref 20–29)
Calcium: 8.7 mg/dL (ref 8.6–10.2)
Chloride: 106 mmol/L (ref 96–106)
Creatinine, Ser: 1.58 mg/dL — ABNORMAL HIGH (ref 0.76–1.27)
Glucose: 102 mg/dL — ABNORMAL HIGH (ref 70–99)
Potassium: 5.3 mmol/L — ABNORMAL HIGH (ref 3.5–5.2)
Sodium: 140 mmol/L (ref 134–144)
eGFR: 41 mL/min/{1.73_m2} — ABNORMAL LOW (ref >59)

## 2023-07-14 LAB — MAGNESIUM: Magnesium: 2.1 mg/dL (ref 1.6–2.3)

## 2023-07-14 NOTE — Telephone Encounter (Signed)
-----   Message from Pomona R Revankar sent at 07/14/2023  9:49 AM EST ----- Kayexalate 15 g.  One-time dose.  Recheck in early next week.  Copy primary Garwin Brothers, MD 07/14/2023 9:49 AM

## 2023-07-14 NOTE — Telephone Encounter (Signed)
 Left vm to return call.

## 2023-07-19 ENCOUNTER — Telehealth (HOSPITAL_COMMUNITY): Payer: Self-pay | Admitting: *Deleted

## 2023-07-19 NOTE — Telephone Encounter (Signed)
 Left message on voicemail per DPR in reference to upcoming appointment scheduled on 07/21/23 with detailed instructions given per Myocardial Perfusion Study Information Sheet for the test. LM to arrive 15 minutes early, and that it is imperative to arrive on time for appointment to keep from having the test rescheduled. If you need to cancel or reschedule your appointment, please call the office within 24 hours of your appointment. Failure to do so may result in a cancellation of your appointment, and a $50 no show fee. Phone number given for call back for any questions. Ricky Ala, RN

## 2023-07-20 ENCOUNTER — Telehealth: Payer: Self-pay

## 2023-07-20 DIAGNOSIS — E875 Hyperkalemia: Secondary | ICD-10-CM

## 2023-07-20 MED ORDER — SODIUM POLYSTYRENE SULFONATE PO POWD: Freq: Once | ORAL | 0 refills | Status: AC

## 2023-07-20 NOTE — Telephone Encounter (Signed)
-----   Message from Pomona R Revankar sent at 07/14/2023  9:49 AM EST ----- Kayexalate 15 g.  One-time dose.  Recheck in early next week.  Copy primary Garwin Brothers, MD 07/14/2023 9:49 AM

## 2023-07-20 NOTE — Telephone Encounter (Signed)
 Recommendations reviewed with pt as per Dr. Kem Parkinson note.  Pt verbalized understanding and had no additional questions.

## 2023-07-21 ENCOUNTER — Ambulatory Visit: Payer: Medicare HMO | Attending: Cardiology

## 2023-07-21 DIAGNOSIS — I4729 Other ventricular tachycardia: Secondary | ICD-10-CM | POA: Diagnosis not present

## 2023-07-21 DIAGNOSIS — I2510 Atherosclerotic heart disease of native coronary artery without angina pectoris: Secondary | ICD-10-CM | POA: Diagnosis not present

## 2023-07-21 MED ORDER — REGADENOSON 0.4 MG/5ML IV SOLN
0.4000 mg | Freq: Once | INTRAVENOUS | Status: AC
  Administered 2023-07-21: 0.4 mg via INTRAVENOUS

## 2023-07-21 MED ORDER — TECHNETIUM TC 99M TETROFOSMIN IV KIT
31.6000 | PACK | Freq: Once | INTRAVENOUS | Status: AC | PRN
  Administered 2023-07-21: 31.6 via INTRAVENOUS

## 2023-07-21 MED ORDER — TECHNETIUM TC 99M TETROFOSMIN IV KIT
10.9000 | PACK | Freq: Once | INTRAVENOUS | Status: AC | PRN
Start: 2023-07-21 — End: 2023-07-21
  Administered 2023-07-21: 10.9 via INTRAVENOUS

## 2023-07-22 LAB — MYOCARDIAL PERFUSION IMAGING
LV dias vol: 110 mL (ref 62–150)
LV sys vol: 50 mL
Nuc Stress EF: 54 %
Peak HR: 79 {beats}/min
Rest HR: 61 {beats}/min
Rest Nuclear Isotope Dose: 10.9 mCi
SDS: 0
SRS: 2
SSS: 2
Stress Nuclear Isotope Dose: 31.6 mCi
TID: 0.93

## 2023-07-25 ENCOUNTER — Telehealth: Payer: Self-pay

## 2023-07-25 NOTE — Telephone Encounter (Signed)
 Left message to return call

## 2023-07-25 NOTE — Telephone Encounter (Signed)
-----   Message from Aundra Dubin Revankar sent at 07/22/2023  5:57 PM EST ----- The results of the study is unremarkable. Please inform patient. I will discuss in detail at next appointment. Cc  primary care/referring physician Garwin Brothers, MD 07/22/2023 5:57 PM

## 2023-07-28 DIAGNOSIS — E875 Hyperkalemia: Secondary | ICD-10-CM | POA: Diagnosis not present

## 2023-07-29 LAB — BASIC METABOLIC PANEL
BUN/Creatinine Ratio: 17 (ref 10–24)
BUN: 33 mg/dL (ref 10–36)
CO2: 19 mmol/L — ABNORMAL LOW (ref 20–29)
Calcium: 8.6 mg/dL (ref 8.6–10.2)
Chloride: 105 mmol/L (ref 96–106)
Creatinine, Ser: 1.95 mg/dL — ABNORMAL HIGH (ref 0.76–1.27)
Glucose: 86 mg/dL (ref 70–99)
Potassium: 5 mmol/L (ref 3.5–5.2)
Sodium: 140 mmol/L (ref 134–144)
eGFR: 32 mL/min/{1.73_m2} — ABNORMAL LOW (ref >59)

## 2023-08-23 DIAGNOSIS — E785 Hyperlipidemia, unspecified: Secondary | ICD-10-CM | POA: Diagnosis not present

## 2023-08-23 DIAGNOSIS — R7302 Impaired glucose tolerance (oral): Secondary | ICD-10-CM | POA: Diagnosis not present

## 2023-08-23 DIAGNOSIS — N2581 Secondary hyperparathyroidism of renal origin: Secondary | ICD-10-CM | POA: Diagnosis not present

## 2023-08-23 DIAGNOSIS — I25119 Atherosclerotic heart disease of native coronary artery with unspecified angina pectoris: Secondary | ICD-10-CM | POA: Diagnosis not present

## 2023-08-23 DIAGNOSIS — D631 Anemia in chronic kidney disease: Secondary | ICD-10-CM | POA: Diagnosis not present

## 2023-08-23 DIAGNOSIS — N1832 Chronic kidney disease, stage 3b: Secondary | ICD-10-CM | POA: Diagnosis not present

## 2023-08-23 DIAGNOSIS — R710 Precipitous drop in hematocrit: Secondary | ICD-10-CM | POA: Diagnosis not present

## 2023-08-23 DIAGNOSIS — G63 Polyneuropathy in diseases classified elsewhere: Secondary | ICD-10-CM | POA: Diagnosis not present

## 2023-08-23 DIAGNOSIS — Z0000 Encounter for general adult medical examination without abnormal findings: Secondary | ICD-10-CM | POA: Diagnosis not present

## 2023-08-23 DIAGNOSIS — M1A0720 Idiopathic chronic gout, left ankle and foot, without tophus (tophi): Secondary | ICD-10-CM | POA: Diagnosis not present

## 2023-08-26 DIAGNOSIS — M25551 Pain in right hip: Secondary | ICD-10-CM | POA: Diagnosis not present

## 2023-08-26 DIAGNOSIS — Z6830 Body mass index (BMI) 30.0-30.9, adult: Secondary | ICD-10-CM | POA: Diagnosis not present

## 2023-08-31 DIAGNOSIS — M79674 Pain in right toe(s): Secondary | ICD-10-CM | POA: Diagnosis not present

## 2023-08-31 DIAGNOSIS — B351 Tinea unguium: Secondary | ICD-10-CM | POA: Diagnosis not present

## 2023-08-31 DIAGNOSIS — M79675 Pain in left toe(s): Secondary | ICD-10-CM | POA: Diagnosis not present

## 2023-09-03 DIAGNOSIS — I10 Essential (primary) hypertension: Secondary | ICD-10-CM | POA: Diagnosis not present

## 2023-09-03 DIAGNOSIS — E860 Dehydration: Secondary | ICD-10-CM | POA: Diagnosis not present

## 2023-09-03 DIAGNOSIS — N179 Acute kidney failure, unspecified: Secondary | ICD-10-CM | POA: Diagnosis not present

## 2023-09-03 DIAGNOSIS — M1611 Unilateral primary osteoarthritis, right hip: Secondary | ICD-10-CM | POA: Diagnosis not present

## 2023-09-03 DIAGNOSIS — Z79899 Other long term (current) drug therapy: Secondary | ICD-10-CM | POA: Diagnosis not present

## 2023-09-03 DIAGNOSIS — R42 Dizziness and giddiness: Secondary | ICD-10-CM | POA: Diagnosis not present

## 2023-09-03 DIAGNOSIS — M25451 Effusion, right hip: Secondary | ICD-10-CM | POA: Diagnosis not present

## 2023-09-03 DIAGNOSIS — Z96641 Presence of right artificial hip joint: Secondary | ICD-10-CM | POA: Diagnosis not present

## 2023-09-03 DIAGNOSIS — I2510 Atherosclerotic heart disease of native coronary artery without angina pectoris: Secondary | ICD-10-CM | POA: Diagnosis not present

## 2023-09-03 DIAGNOSIS — D649 Anemia, unspecified: Secondary | ICD-10-CM | POA: Diagnosis not present

## 2023-09-04 DIAGNOSIS — N179 Acute kidney failure, unspecified: Secondary | ICD-10-CM | POA: Diagnosis not present

## 2023-09-04 DIAGNOSIS — M1611 Unilateral primary osteoarthritis, right hip: Secondary | ICD-10-CM | POA: Diagnosis not present

## 2023-09-04 DIAGNOSIS — E860 Dehydration: Secondary | ICD-10-CM | POA: Diagnosis not present

## 2023-09-04 DIAGNOSIS — M25451 Effusion, right hip: Secondary | ICD-10-CM | POA: Diagnosis not present

## 2023-09-04 DIAGNOSIS — Z96641 Presence of right artificial hip joint: Secondary | ICD-10-CM | POA: Diagnosis not present

## 2023-09-04 DIAGNOSIS — R42 Dizziness and giddiness: Secondary | ICD-10-CM | POA: Diagnosis not present

## 2023-09-04 DIAGNOSIS — D649 Anemia, unspecified: Secondary | ICD-10-CM | POA: Diagnosis not present

## 2023-09-06 DIAGNOSIS — Z0000 Encounter for general adult medical examination without abnormal findings: Secondary | ICD-10-CM | POA: Diagnosis not present

## 2023-09-06 DIAGNOSIS — E785 Hyperlipidemia, unspecified: Secondary | ICD-10-CM | POA: Diagnosis not present

## 2023-09-07 DIAGNOSIS — M25551 Pain in right hip: Secondary | ICD-10-CM | POA: Diagnosis not present

## 2023-09-07 DIAGNOSIS — L989 Disorder of the skin and subcutaneous tissue, unspecified: Secondary | ICD-10-CM | POA: Diagnosis not present

## 2023-09-07 DIAGNOSIS — H02843 Edema of right eye, unspecified eyelid: Secondary | ICD-10-CM | POA: Diagnosis not present

## 2023-09-07 DIAGNOSIS — Z6829 Body mass index (BMI) 29.0-29.9, adult: Secondary | ICD-10-CM | POA: Diagnosis not present

## 2023-09-09 DIAGNOSIS — G629 Polyneuropathy, unspecified: Secondary | ICD-10-CM | POA: Diagnosis not present

## 2023-09-09 DIAGNOSIS — R339 Retention of urine, unspecified: Secondary | ICD-10-CM | POA: Diagnosis not present

## 2023-09-09 DIAGNOSIS — E785 Hyperlipidemia, unspecified: Secondary | ICD-10-CM | POA: Diagnosis not present

## 2023-09-09 DIAGNOSIS — Z86718 Personal history of other venous thrombosis and embolism: Secondary | ICD-10-CM | POA: Diagnosis not present

## 2023-09-09 DIAGNOSIS — N183 Chronic kidney disease, stage 3 unspecified: Secondary | ICD-10-CM | POA: Diagnosis not present

## 2023-09-09 DIAGNOSIS — M109 Gout, unspecified: Secondary | ICD-10-CM | POA: Diagnosis not present

## 2023-09-09 DIAGNOSIS — J449 Chronic obstructive pulmonary disease, unspecified: Secondary | ICD-10-CM | POA: Diagnosis not present

## 2023-09-09 DIAGNOSIS — A419 Sepsis, unspecified organism: Secondary | ICD-10-CM | POA: Diagnosis not present

## 2023-09-09 DIAGNOSIS — I129 Hypertensive chronic kidney disease with stage 1 through stage 4 chronic kidney disease, or unspecified chronic kidney disease: Secondary | ICD-10-CM | POA: Diagnosis not present

## 2023-09-09 DIAGNOSIS — E872 Acidosis, unspecified: Secondary | ICD-10-CM | POA: Diagnosis not present

## 2023-09-09 DIAGNOSIS — L03213 Periorbital cellulitis: Secondary | ICD-10-CM | POA: Diagnosis not present

## 2023-09-09 DIAGNOSIS — H3530 Unspecified macular degeneration: Secondary | ICD-10-CM | POA: Diagnosis not present

## 2023-09-09 DIAGNOSIS — H05221 Edema of right orbit: Secondary | ICD-10-CM | POA: Diagnosis not present

## 2023-09-09 DIAGNOSIS — B0089 Other herpesviral infection: Secondary | ICD-10-CM | POA: Diagnosis not present

## 2023-09-09 DIAGNOSIS — B029 Zoster without complications: Secondary | ICD-10-CM | POA: Diagnosis not present

## 2023-09-09 DIAGNOSIS — B0230 Zoster ocular disease, unspecified: Secondary | ICD-10-CM | POA: Diagnosis not present

## 2023-09-09 DIAGNOSIS — I10 Essential (primary) hypertension: Secondary | ICD-10-CM | POA: Diagnosis not present

## 2023-09-10 DIAGNOSIS — Z86718 Personal history of other venous thrombosis and embolism: Secondary | ICD-10-CM | POA: Diagnosis not present

## 2023-09-10 DIAGNOSIS — I10 Essential (primary) hypertension: Secondary | ICD-10-CM | POA: Diagnosis not present

## 2023-09-10 DIAGNOSIS — G629 Polyneuropathy, unspecified: Secondary | ICD-10-CM | POA: Diagnosis not present

## 2023-09-10 DIAGNOSIS — A419 Sepsis, unspecified organism: Secondary | ICD-10-CM | POA: Diagnosis not present

## 2023-09-10 DIAGNOSIS — B0230 Zoster ocular disease, unspecified: Secondary | ICD-10-CM | POA: Diagnosis not present

## 2023-09-10 DIAGNOSIS — R339 Retention of urine, unspecified: Secondary | ICD-10-CM | POA: Diagnosis not present

## 2023-09-10 DIAGNOSIS — E785 Hyperlipidemia, unspecified: Secondary | ICD-10-CM | POA: Diagnosis not present

## 2023-09-10 DIAGNOSIS — L03213 Periorbital cellulitis: Secondary | ICD-10-CM | POA: Diagnosis not present

## 2023-09-10 DIAGNOSIS — H05221 Edema of right orbit: Secondary | ICD-10-CM | POA: Diagnosis not present

## 2023-09-11 DIAGNOSIS — Z86718 Personal history of other venous thrombosis and embolism: Secondary | ICD-10-CM | POA: Diagnosis not present

## 2023-09-11 DIAGNOSIS — B0230 Zoster ocular disease, unspecified: Secondary | ICD-10-CM | POA: Diagnosis not present

## 2023-09-11 DIAGNOSIS — R339 Retention of urine, unspecified: Secondary | ICD-10-CM | POA: Diagnosis not present

## 2023-09-11 DIAGNOSIS — A419 Sepsis, unspecified organism: Secondary | ICD-10-CM | POA: Diagnosis not present

## 2023-09-11 DIAGNOSIS — I10 Essential (primary) hypertension: Secondary | ICD-10-CM | POA: Diagnosis not present

## 2023-09-11 DIAGNOSIS — E785 Hyperlipidemia, unspecified: Secondary | ICD-10-CM | POA: Diagnosis not present

## 2023-09-11 DIAGNOSIS — L03213 Periorbital cellulitis: Secondary | ICD-10-CM | POA: Diagnosis not present

## 2023-09-11 DIAGNOSIS — G629 Polyneuropathy, unspecified: Secondary | ICD-10-CM | POA: Diagnosis not present

## 2023-09-12 DIAGNOSIS — G629 Polyneuropathy, unspecified: Secondary | ICD-10-CM | POA: Diagnosis not present

## 2023-09-12 DIAGNOSIS — M109 Gout, unspecified: Secondary | ICD-10-CM | POA: Diagnosis not present

## 2023-09-12 DIAGNOSIS — B0230 Zoster ocular disease, unspecified: Secondary | ICD-10-CM | POA: Diagnosis not present

## 2023-09-12 DIAGNOSIS — L03213 Periorbital cellulitis: Secondary | ICD-10-CM | POA: Diagnosis not present

## 2023-09-12 DIAGNOSIS — A419 Sepsis, unspecified organism: Secondary | ICD-10-CM | POA: Diagnosis not present

## 2023-09-12 DIAGNOSIS — R339 Retention of urine, unspecified: Secondary | ICD-10-CM | POA: Diagnosis not present

## 2023-09-12 DIAGNOSIS — E785 Hyperlipidemia, unspecified: Secondary | ICD-10-CM | POA: Diagnosis not present

## 2023-09-12 DIAGNOSIS — I10 Essential (primary) hypertension: Secondary | ICD-10-CM | POA: Diagnosis not present

## 2023-09-13 DIAGNOSIS — B0230 Zoster ocular disease, unspecified: Secondary | ICD-10-CM | POA: Diagnosis not present

## 2023-09-13 DIAGNOSIS — L03213 Periorbital cellulitis: Secondary | ICD-10-CM | POA: Diagnosis not present

## 2023-09-13 DIAGNOSIS — A419 Sepsis, unspecified organism: Secondary | ICD-10-CM | POA: Diagnosis not present

## 2023-09-15 DIAGNOSIS — R531 Weakness: Secondary | ICD-10-CM | POA: Diagnosis not present

## 2023-09-15 DIAGNOSIS — M6281 Muscle weakness (generalized): Secondary | ICD-10-CM | POA: Diagnosis not present

## 2023-09-15 DIAGNOSIS — E66811 Obesity, class 1: Secondary | ICD-10-CM | POA: Diagnosis not present

## 2023-09-15 DIAGNOSIS — B029 Zoster without complications: Secondary | ICD-10-CM | POA: Diagnosis not present

## 2023-09-15 DIAGNOSIS — Z741 Need for assistance with personal care: Secondary | ICD-10-CM | POA: Diagnosis not present

## 2023-09-15 DIAGNOSIS — E878 Other disorders of electrolyte and fluid balance, not elsewhere classified: Secondary | ICD-10-CM | POA: Diagnosis not present

## 2023-09-15 DIAGNOSIS — R739 Hyperglycemia, unspecified: Secondary | ICD-10-CM | POA: Diagnosis not present

## 2023-09-15 DIAGNOSIS — R410 Disorientation, unspecified: Secondary | ICD-10-CM | POA: Diagnosis not present

## 2023-09-15 DIAGNOSIS — L0201 Cutaneous abscess of face: Secondary | ICD-10-CM | POA: Diagnosis not present

## 2023-09-15 DIAGNOSIS — R2681 Unsteadiness on feet: Secondary | ICD-10-CM | POA: Diagnosis not present

## 2023-09-15 DIAGNOSIS — G9340 Encephalopathy, unspecified: Secondary | ICD-10-CM | POA: Diagnosis not present

## 2023-09-15 DIAGNOSIS — E860 Dehydration: Secondary | ICD-10-CM | POA: Diagnosis not present

## 2023-09-15 DIAGNOSIS — N183 Chronic kidney disease, stage 3 unspecified: Secondary | ICD-10-CM | POA: Diagnosis not present

## 2023-09-15 DIAGNOSIS — Z6831 Body mass index (BMI) 31.0-31.9, adult: Secondary | ICD-10-CM | POA: Diagnosis not present

## 2023-09-15 DIAGNOSIS — N179 Acute kidney failure, unspecified: Secondary | ICD-10-CM | POA: Diagnosis not present

## 2023-09-15 DIAGNOSIS — D649 Anemia, unspecified: Secondary | ICD-10-CM | POA: Diagnosis not present

## 2023-09-15 DIAGNOSIS — I2510 Atherosclerotic heart disease of native coronary artery without angina pectoris: Secondary | ICD-10-CM | POA: Diagnosis not present

## 2023-09-15 DIAGNOSIS — L03211 Cellulitis of face: Secondary | ICD-10-CM | POA: Diagnosis not present

## 2023-09-15 DIAGNOSIS — E559 Vitamin D deficiency, unspecified: Secondary | ICD-10-CM | POA: Diagnosis not present

## 2023-09-15 DIAGNOSIS — E875 Hyperkalemia: Secondary | ICD-10-CM | POA: Diagnosis not present

## 2023-09-15 DIAGNOSIS — I4891 Unspecified atrial fibrillation: Secondary | ICD-10-CM | POA: Diagnosis not present

## 2023-09-15 DIAGNOSIS — I959 Hypotension, unspecified: Secondary | ICD-10-CM | POA: Diagnosis not present

## 2023-09-15 DIAGNOSIS — R000 Tachycardia, unspecified: Secondary | ICD-10-CM | POA: Diagnosis not present

## 2023-09-15 DIAGNOSIS — N281 Cyst of kidney, acquired: Secondary | ICD-10-CM | POA: Diagnosis not present

## 2023-09-15 DIAGNOSIS — E6609 Other obesity due to excess calories: Secondary | ICD-10-CM | POA: Diagnosis not present

## 2023-09-15 DIAGNOSIS — I129 Hypertensive chronic kidney disease with stage 1 through stage 4 chronic kidney disease, or unspecified chronic kidney disease: Secondary | ICD-10-CM | POA: Diagnosis not present

## 2023-09-15 DIAGNOSIS — I10 Essential (primary) hypertension: Secondary | ICD-10-CM | POA: Diagnosis not present

## 2023-09-15 DIAGNOSIS — R339 Retention of urine, unspecified: Secondary | ICD-10-CM | POA: Diagnosis not present

## 2023-09-15 DIAGNOSIS — N1831 Chronic kidney disease, stage 3a: Secondary | ICD-10-CM | POA: Diagnosis not present

## 2023-09-15 DIAGNOSIS — F32A Depression, unspecified: Secondary | ICD-10-CM | POA: Diagnosis not present

## 2023-09-15 DIAGNOSIS — Z6830 Body mass index (BMI) 30.0-30.9, adult: Secondary | ICD-10-CM | POA: Diagnosis not present

## 2023-09-16 DIAGNOSIS — R531 Weakness: Secondary | ICD-10-CM | POA: Diagnosis not present

## 2023-09-16 DIAGNOSIS — Z6830 Body mass index (BMI) 30.0-30.9, adult: Secondary | ICD-10-CM | POA: Diagnosis not present

## 2023-09-16 DIAGNOSIS — D649 Anemia, unspecified: Secondary | ICD-10-CM | POA: Diagnosis not present

## 2023-09-16 DIAGNOSIS — N179 Acute kidney failure, unspecified: Secondary | ICD-10-CM | POA: Diagnosis not present

## 2023-09-16 DIAGNOSIS — L0201 Cutaneous abscess of face: Secondary | ICD-10-CM | POA: Diagnosis not present

## 2023-09-16 DIAGNOSIS — E66811 Obesity, class 1: Secondary | ICD-10-CM | POA: Diagnosis not present

## 2023-09-16 DIAGNOSIS — L03211 Cellulitis of face: Secondary | ICD-10-CM | POA: Diagnosis not present

## 2023-09-16 DIAGNOSIS — I10 Essential (primary) hypertension: Secondary | ICD-10-CM | POA: Diagnosis not present

## 2023-09-16 DIAGNOSIS — B029 Zoster without complications: Secondary | ICD-10-CM | POA: Diagnosis not present

## 2023-09-17 DIAGNOSIS — E66811 Obesity, class 1: Secondary | ICD-10-CM | POA: Diagnosis not present

## 2023-09-17 DIAGNOSIS — D649 Anemia, unspecified: Secondary | ICD-10-CM | POA: Diagnosis not present

## 2023-09-17 DIAGNOSIS — I10 Essential (primary) hypertension: Secondary | ICD-10-CM | POA: Diagnosis not present

## 2023-09-17 DIAGNOSIS — L03211 Cellulitis of face: Secondary | ICD-10-CM | POA: Diagnosis not present

## 2023-09-17 DIAGNOSIS — R531 Weakness: Secondary | ICD-10-CM | POA: Diagnosis not present

## 2023-09-17 DIAGNOSIS — E875 Hyperkalemia: Secondary | ICD-10-CM | POA: Diagnosis not present

## 2023-09-17 DIAGNOSIS — N179 Acute kidney failure, unspecified: Secondary | ICD-10-CM | POA: Diagnosis not present

## 2023-09-17 DIAGNOSIS — Z6831 Body mass index (BMI) 31.0-31.9, adult: Secondary | ICD-10-CM | POA: Diagnosis not present

## 2023-09-17 DIAGNOSIS — L0201 Cutaneous abscess of face: Secondary | ICD-10-CM | POA: Diagnosis not present

## 2023-09-18 DIAGNOSIS — Z6831 Body mass index (BMI) 31.0-31.9, adult: Secondary | ICD-10-CM | POA: Diagnosis not present

## 2023-09-18 DIAGNOSIS — N179 Acute kidney failure, unspecified: Secondary | ICD-10-CM | POA: Diagnosis not present

## 2023-09-18 DIAGNOSIS — E875 Hyperkalemia: Secondary | ICD-10-CM | POA: Diagnosis not present

## 2023-09-18 DIAGNOSIS — D649 Anemia, unspecified: Secondary | ICD-10-CM | POA: Diagnosis not present

## 2023-09-18 DIAGNOSIS — I10 Essential (primary) hypertension: Secondary | ICD-10-CM | POA: Diagnosis not present

## 2023-09-18 DIAGNOSIS — L03211 Cellulitis of face: Secondary | ICD-10-CM | POA: Diagnosis not present

## 2023-09-18 DIAGNOSIS — L0201 Cutaneous abscess of face: Secondary | ICD-10-CM | POA: Diagnosis not present

## 2023-09-18 DIAGNOSIS — R531 Weakness: Secondary | ICD-10-CM | POA: Diagnosis not present

## 2023-09-18 DIAGNOSIS — E66811 Obesity, class 1: Secondary | ICD-10-CM | POA: Diagnosis not present

## 2023-09-19 DIAGNOSIS — I10 Essential (primary) hypertension: Secondary | ICD-10-CM | POA: Diagnosis not present

## 2023-09-19 DIAGNOSIS — E875 Hyperkalemia: Secondary | ICD-10-CM | POA: Diagnosis not present

## 2023-09-19 DIAGNOSIS — N179 Acute kidney failure, unspecified: Secondary | ICD-10-CM | POA: Diagnosis not present

## 2023-09-19 DIAGNOSIS — D649 Anemia, unspecified: Secondary | ICD-10-CM | POA: Diagnosis not present

## 2023-09-19 DIAGNOSIS — Z6831 Body mass index (BMI) 31.0-31.9, adult: Secondary | ICD-10-CM | POA: Diagnosis not present

## 2023-09-19 DIAGNOSIS — E66811 Obesity, class 1: Secondary | ICD-10-CM | POA: Diagnosis not present

## 2023-09-19 DIAGNOSIS — R531 Weakness: Secondary | ICD-10-CM | POA: Diagnosis not present

## 2023-09-19 DIAGNOSIS — L03211 Cellulitis of face: Secondary | ICD-10-CM | POA: Diagnosis not present

## 2023-09-19 DIAGNOSIS — B029 Zoster without complications: Secondary | ICD-10-CM | POA: Diagnosis not present

## 2023-09-19 DIAGNOSIS — L0201 Cutaneous abscess of face: Secondary | ICD-10-CM | POA: Diagnosis not present

## 2023-09-20 DIAGNOSIS — L03211 Cellulitis of face: Secondary | ICD-10-CM | POA: Diagnosis not present

## 2023-09-20 DIAGNOSIS — E66811 Obesity, class 1: Secondary | ICD-10-CM | POA: Diagnosis not present

## 2023-09-20 DIAGNOSIS — R531 Weakness: Secondary | ICD-10-CM | POA: Diagnosis not present

## 2023-09-20 DIAGNOSIS — Z6831 Body mass index (BMI) 31.0-31.9, adult: Secondary | ICD-10-CM | POA: Diagnosis not present

## 2023-09-20 DIAGNOSIS — D649 Anemia, unspecified: Secondary | ICD-10-CM | POA: Diagnosis not present

## 2023-09-20 DIAGNOSIS — E875 Hyperkalemia: Secondary | ICD-10-CM | POA: Diagnosis not present

## 2023-09-20 DIAGNOSIS — L0201 Cutaneous abscess of face: Secondary | ICD-10-CM | POA: Diagnosis not present

## 2023-09-20 DIAGNOSIS — I10 Essential (primary) hypertension: Secondary | ICD-10-CM | POA: Diagnosis not present

## 2023-09-20 DIAGNOSIS — N179 Acute kidney failure, unspecified: Secondary | ICD-10-CM | POA: Diagnosis not present

## 2023-09-21 DIAGNOSIS — R531 Weakness: Secondary | ICD-10-CM | POA: Diagnosis not present

## 2023-09-21 DIAGNOSIS — I10 Essential (primary) hypertension: Secondary | ICD-10-CM | POA: Diagnosis not present

## 2023-09-21 DIAGNOSIS — L03211 Cellulitis of face: Secondary | ICD-10-CM | POA: Diagnosis not present

## 2023-09-21 DIAGNOSIS — L0201 Cutaneous abscess of face: Secondary | ICD-10-CM | POA: Diagnosis not present

## 2023-09-21 DIAGNOSIS — D649 Anemia, unspecified: Secondary | ICD-10-CM | POA: Diagnosis not present

## 2023-09-21 DIAGNOSIS — E875 Hyperkalemia: Secondary | ICD-10-CM | POA: Diagnosis not present

## 2023-09-21 DIAGNOSIS — N179 Acute kidney failure, unspecified: Secondary | ICD-10-CM | POA: Diagnosis not present

## 2023-09-21 DIAGNOSIS — I2510 Atherosclerotic heart disease of native coronary artery without angina pectoris: Secondary | ICD-10-CM | POA: Diagnosis not present

## 2023-09-22 DIAGNOSIS — L03211 Cellulitis of face: Secondary | ICD-10-CM | POA: Diagnosis not present

## 2023-09-22 DIAGNOSIS — G9340 Encephalopathy, unspecified: Secondary | ICD-10-CM | POA: Diagnosis not present

## 2023-09-22 DIAGNOSIS — D649 Anemia, unspecified: Secondary | ICD-10-CM | POA: Diagnosis not present

## 2023-09-22 DIAGNOSIS — R410 Disorientation, unspecified: Secondary | ICD-10-CM | POA: Diagnosis not present

## 2023-09-22 DIAGNOSIS — I10 Essential (primary) hypertension: Secondary | ICD-10-CM | POA: Diagnosis not present

## 2023-09-22 DIAGNOSIS — L0201 Cutaneous abscess of face: Secondary | ICD-10-CM | POA: Diagnosis not present

## 2023-09-22 DIAGNOSIS — R339 Retention of urine, unspecified: Secondary | ICD-10-CM | POA: Diagnosis not present

## 2023-09-22 DIAGNOSIS — I2510 Atherosclerotic heart disease of native coronary artery without angina pectoris: Secondary | ICD-10-CM | POA: Diagnosis not present

## 2023-09-22 DIAGNOSIS — R531 Weakness: Secondary | ICD-10-CM | POA: Diagnosis not present

## 2023-09-22 DIAGNOSIS — N179 Acute kidney failure, unspecified: Secondary | ICD-10-CM | POA: Diagnosis not present

## 2023-09-23 DIAGNOSIS — I2510 Atherosclerotic heart disease of native coronary artery without angina pectoris: Secondary | ICD-10-CM | POA: Diagnosis not present

## 2023-09-23 DIAGNOSIS — L0201 Cutaneous abscess of face: Secondary | ICD-10-CM | POA: Diagnosis not present

## 2023-09-23 DIAGNOSIS — G9340 Encephalopathy, unspecified: Secondary | ICD-10-CM | POA: Diagnosis not present

## 2023-09-23 DIAGNOSIS — L03211 Cellulitis of face: Secondary | ICD-10-CM | POA: Diagnosis not present

## 2023-09-23 DIAGNOSIS — D649 Anemia, unspecified: Secondary | ICD-10-CM | POA: Diagnosis not present

## 2023-09-23 DIAGNOSIS — R531 Weakness: Secondary | ICD-10-CM | POA: Diagnosis not present

## 2023-09-23 DIAGNOSIS — R339 Retention of urine, unspecified: Secondary | ICD-10-CM | POA: Diagnosis not present

## 2023-09-23 DIAGNOSIS — I10 Essential (primary) hypertension: Secondary | ICD-10-CM | POA: Diagnosis not present

## 2023-09-23 DIAGNOSIS — N179 Acute kidney failure, unspecified: Secondary | ICD-10-CM | POA: Diagnosis not present

## 2023-09-24 DIAGNOSIS — N179 Acute kidney failure, unspecified: Secondary | ICD-10-CM | POA: Diagnosis not present

## 2023-09-24 DIAGNOSIS — I2510 Atherosclerotic heart disease of native coronary artery without angina pectoris: Secondary | ICD-10-CM | POA: Diagnosis not present

## 2023-09-24 DIAGNOSIS — R531 Weakness: Secondary | ICD-10-CM | POA: Diagnosis not present

## 2023-09-24 DIAGNOSIS — L03211 Cellulitis of face: Secondary | ICD-10-CM | POA: Diagnosis not present

## 2023-09-24 DIAGNOSIS — L0201 Cutaneous abscess of face: Secondary | ICD-10-CM | POA: Diagnosis not present

## 2023-09-24 DIAGNOSIS — D649 Anemia, unspecified: Secondary | ICD-10-CM | POA: Diagnosis not present

## 2023-09-24 DIAGNOSIS — R339 Retention of urine, unspecified: Secondary | ICD-10-CM | POA: Diagnosis not present

## 2023-09-24 DIAGNOSIS — I10 Essential (primary) hypertension: Secondary | ICD-10-CM | POA: Diagnosis not present

## 2023-09-24 DIAGNOSIS — G9340 Encephalopathy, unspecified: Secondary | ICD-10-CM | POA: Diagnosis not present

## 2023-09-25 DIAGNOSIS — N179 Acute kidney failure, unspecified: Secondary | ICD-10-CM | POA: Diagnosis not present

## 2023-09-25 DIAGNOSIS — D649 Anemia, unspecified: Secondary | ICD-10-CM | POA: Diagnosis not present

## 2023-09-25 DIAGNOSIS — L03211 Cellulitis of face: Secondary | ICD-10-CM | POA: Diagnosis not present

## 2023-09-25 DIAGNOSIS — R531 Weakness: Secondary | ICD-10-CM | POA: Diagnosis not present

## 2023-09-25 DIAGNOSIS — I10 Essential (primary) hypertension: Secondary | ICD-10-CM | POA: Diagnosis not present

## 2023-09-25 DIAGNOSIS — R339 Retention of urine, unspecified: Secondary | ICD-10-CM | POA: Diagnosis not present

## 2023-09-25 DIAGNOSIS — I2510 Atherosclerotic heart disease of native coronary artery without angina pectoris: Secondary | ICD-10-CM | POA: Diagnosis not present

## 2023-09-25 DIAGNOSIS — G9340 Encephalopathy, unspecified: Secondary | ICD-10-CM | POA: Diagnosis not present

## 2023-09-25 DIAGNOSIS — L0201 Cutaneous abscess of face: Secondary | ICD-10-CM | POA: Diagnosis not present

## 2023-09-26 DIAGNOSIS — R339 Retention of urine, unspecified: Secondary | ICD-10-CM | POA: Diagnosis not present

## 2023-09-26 DIAGNOSIS — L03211 Cellulitis of face: Secondary | ICD-10-CM | POA: Diagnosis not present

## 2023-09-26 DIAGNOSIS — R531 Weakness: Secondary | ICD-10-CM | POA: Diagnosis not present

## 2023-09-26 DIAGNOSIS — I2510 Atherosclerotic heart disease of native coronary artery without angina pectoris: Secondary | ICD-10-CM | POA: Diagnosis not present

## 2023-09-26 DIAGNOSIS — L0201 Cutaneous abscess of face: Secondary | ICD-10-CM | POA: Diagnosis not present

## 2023-09-26 DIAGNOSIS — I10 Essential (primary) hypertension: Secondary | ICD-10-CM | POA: Diagnosis not present

## 2023-09-26 DIAGNOSIS — D649 Anemia, unspecified: Secondary | ICD-10-CM | POA: Diagnosis not present

## 2023-09-26 DIAGNOSIS — G9340 Encephalopathy, unspecified: Secondary | ICD-10-CM | POA: Diagnosis not present

## 2023-09-26 DIAGNOSIS — N179 Acute kidney failure, unspecified: Secondary | ICD-10-CM | POA: Diagnosis not present

## 2023-09-27 DIAGNOSIS — L03211 Cellulitis of face: Secondary | ICD-10-CM | POA: Diagnosis not present

## 2023-09-27 DIAGNOSIS — D649 Anemia, unspecified: Secondary | ICD-10-CM | POA: Diagnosis not present

## 2023-09-27 DIAGNOSIS — I10 Essential (primary) hypertension: Secondary | ICD-10-CM | POA: Diagnosis not present

## 2023-09-27 DIAGNOSIS — I2510 Atherosclerotic heart disease of native coronary artery without angina pectoris: Secondary | ICD-10-CM | POA: Diagnosis not present

## 2023-09-27 DIAGNOSIS — R531 Weakness: Secondary | ICD-10-CM | POA: Diagnosis not present

## 2023-09-27 DIAGNOSIS — N179 Acute kidney failure, unspecified: Secondary | ICD-10-CM | POA: Diagnosis not present

## 2023-09-27 DIAGNOSIS — L0201 Cutaneous abscess of face: Secondary | ICD-10-CM | POA: Diagnosis not present

## 2023-09-27 DIAGNOSIS — G9340 Encephalopathy, unspecified: Secondary | ICD-10-CM | POA: Diagnosis not present

## 2023-09-27 DIAGNOSIS — R339 Retention of urine, unspecified: Secondary | ICD-10-CM | POA: Diagnosis not present

## 2023-09-28 DIAGNOSIS — R531 Weakness: Secondary | ICD-10-CM | POA: Diagnosis not present

## 2023-09-28 DIAGNOSIS — G9340 Encephalopathy, unspecified: Secondary | ICD-10-CM | POA: Diagnosis not present

## 2023-09-28 DIAGNOSIS — L0201 Cutaneous abscess of face: Secondary | ICD-10-CM | POA: Diagnosis not present

## 2023-09-28 DIAGNOSIS — N183 Chronic kidney disease, stage 3 unspecified: Secondary | ICD-10-CM | POA: Diagnosis not present

## 2023-09-28 DIAGNOSIS — R410 Disorientation, unspecified: Secondary | ICD-10-CM | POA: Diagnosis not present

## 2023-09-28 DIAGNOSIS — R339 Retention of urine, unspecified: Secondary | ICD-10-CM | POA: Diagnosis not present

## 2023-09-28 DIAGNOSIS — L03211 Cellulitis of face: Secondary | ICD-10-CM | POA: Diagnosis not present

## 2023-09-28 DIAGNOSIS — N179 Acute kidney failure, unspecified: Secondary | ICD-10-CM | POA: Diagnosis not present

## 2023-09-28 DIAGNOSIS — I2510 Atherosclerotic heart disease of native coronary artery without angina pectoris: Secondary | ICD-10-CM | POA: Diagnosis not present

## 2023-09-29 DIAGNOSIS — N183 Chronic kidney disease, stage 3 unspecified: Secondary | ICD-10-CM | POA: Diagnosis not present

## 2023-09-29 DIAGNOSIS — N179 Acute kidney failure, unspecified: Secondary | ICD-10-CM | POA: Diagnosis not present

## 2023-09-29 DIAGNOSIS — I2510 Atherosclerotic heart disease of native coronary artery without angina pectoris: Secondary | ICD-10-CM | POA: Diagnosis not present

## 2023-09-29 DIAGNOSIS — R410 Disorientation, unspecified: Secondary | ICD-10-CM | POA: Diagnosis not present

## 2023-09-29 DIAGNOSIS — R339 Retention of urine, unspecified: Secondary | ICD-10-CM | POA: Diagnosis not present

## 2023-09-29 DIAGNOSIS — L0201 Cutaneous abscess of face: Secondary | ICD-10-CM | POA: Diagnosis not present

## 2023-09-29 DIAGNOSIS — R531 Weakness: Secondary | ICD-10-CM | POA: Diagnosis not present

## 2023-09-29 DIAGNOSIS — G9340 Encephalopathy, unspecified: Secondary | ICD-10-CM | POA: Diagnosis not present

## 2023-09-29 DIAGNOSIS — L03211 Cellulitis of face: Secondary | ICD-10-CM | POA: Diagnosis not present

## 2023-09-30 DIAGNOSIS — R339 Retention of urine, unspecified: Secondary | ICD-10-CM | POA: Diagnosis not present

## 2023-09-30 DIAGNOSIS — I2510 Atherosclerotic heart disease of native coronary artery without angina pectoris: Secondary | ICD-10-CM | POA: Diagnosis not present

## 2023-09-30 DIAGNOSIS — N179 Acute kidney failure, unspecified: Secondary | ICD-10-CM | POA: Diagnosis not present

## 2023-09-30 DIAGNOSIS — R410 Disorientation, unspecified: Secondary | ICD-10-CM | POA: Diagnosis not present

## 2023-09-30 DIAGNOSIS — L03211 Cellulitis of face: Secondary | ICD-10-CM | POA: Diagnosis not present

## 2023-09-30 DIAGNOSIS — L0201 Cutaneous abscess of face: Secondary | ICD-10-CM | POA: Diagnosis not present

## 2023-09-30 DIAGNOSIS — G9340 Encephalopathy, unspecified: Secondary | ICD-10-CM | POA: Diagnosis not present

## 2023-09-30 DIAGNOSIS — R531 Weakness: Secondary | ICD-10-CM | POA: Diagnosis not present

## 2023-09-30 DIAGNOSIS — N183 Chronic kidney disease, stage 3 unspecified: Secondary | ICD-10-CM | POA: Diagnosis not present

## 2023-10-01 DIAGNOSIS — N179 Acute kidney failure, unspecified: Secondary | ICD-10-CM | POA: Diagnosis not present

## 2023-10-01 DIAGNOSIS — L03211 Cellulitis of face: Secondary | ICD-10-CM | POA: Diagnosis not present

## 2023-10-01 DIAGNOSIS — L0201 Cutaneous abscess of face: Secondary | ICD-10-CM | POA: Diagnosis not present

## 2023-10-01 DIAGNOSIS — N183 Chronic kidney disease, stage 3 unspecified: Secondary | ICD-10-CM | POA: Diagnosis not present

## 2023-10-01 DIAGNOSIS — R410 Disorientation, unspecified: Secondary | ICD-10-CM | POA: Diagnosis not present

## 2023-10-01 DIAGNOSIS — G9340 Encephalopathy, unspecified: Secondary | ICD-10-CM | POA: Diagnosis not present

## 2023-10-01 DIAGNOSIS — I2510 Atherosclerotic heart disease of native coronary artery without angina pectoris: Secondary | ICD-10-CM | POA: Diagnosis not present

## 2023-10-01 DIAGNOSIS — R339 Retention of urine, unspecified: Secondary | ICD-10-CM | POA: Diagnosis not present

## 2023-10-01 DIAGNOSIS — R531 Weakness: Secondary | ICD-10-CM | POA: Diagnosis not present

## 2023-10-02 DIAGNOSIS — I2510 Atherosclerotic heart disease of native coronary artery without angina pectoris: Secondary | ICD-10-CM | POA: Diagnosis not present

## 2023-10-02 DIAGNOSIS — N183 Chronic kidney disease, stage 3 unspecified: Secondary | ICD-10-CM | POA: Diagnosis not present

## 2023-10-02 DIAGNOSIS — R339 Retention of urine, unspecified: Secondary | ICD-10-CM | POA: Diagnosis not present

## 2023-10-02 DIAGNOSIS — L0201 Cutaneous abscess of face: Secondary | ICD-10-CM | POA: Diagnosis not present

## 2023-10-02 DIAGNOSIS — N179 Acute kidney failure, unspecified: Secondary | ICD-10-CM | POA: Diagnosis not present

## 2023-10-02 DIAGNOSIS — R531 Weakness: Secondary | ICD-10-CM | POA: Diagnosis not present

## 2023-10-02 DIAGNOSIS — L03211 Cellulitis of face: Secondary | ICD-10-CM | POA: Diagnosis not present

## 2023-10-02 DIAGNOSIS — G9340 Encephalopathy, unspecified: Secondary | ICD-10-CM | POA: Diagnosis not present

## 2023-10-02 DIAGNOSIS — R410 Disorientation, unspecified: Secondary | ICD-10-CM | POA: Diagnosis not present

## 2023-10-03 DIAGNOSIS — N179 Acute kidney failure, unspecified: Secondary | ICD-10-CM | POA: Diagnosis not present

## 2023-10-03 DIAGNOSIS — E878 Other disorders of electrolyte and fluid balance, not elsewhere classified: Secondary | ICD-10-CM | POA: Diagnosis not present

## 2023-10-03 DIAGNOSIS — R410 Disorientation, unspecified: Secondary | ICD-10-CM | POA: Diagnosis not present

## 2023-10-03 DIAGNOSIS — F330 Major depressive disorder, recurrent, mild: Secondary | ICD-10-CM | POA: Diagnosis not present

## 2023-10-03 DIAGNOSIS — I10 Essential (primary) hypertension: Secondary | ICD-10-CM | POA: Diagnosis not present

## 2023-10-03 DIAGNOSIS — R339 Retention of urine, unspecified: Secondary | ICD-10-CM | POA: Diagnosis not present

## 2023-10-03 DIAGNOSIS — L03211 Cellulitis of face: Secondary | ICD-10-CM | POA: Diagnosis not present

## 2023-10-03 DIAGNOSIS — L0201 Cutaneous abscess of face: Secondary | ICD-10-CM | POA: Diagnosis not present

## 2023-10-03 DIAGNOSIS — N1831 Chronic kidney disease, stage 3a: Secondary | ICD-10-CM | POA: Diagnosis not present

## 2023-10-03 DIAGNOSIS — F411 Generalized anxiety disorder: Secondary | ICD-10-CM | POA: Diagnosis not present

## 2023-10-03 DIAGNOSIS — E559 Vitamin D deficiency, unspecified: Secondary | ICD-10-CM | POA: Diagnosis not present

## 2023-10-03 DIAGNOSIS — B029 Zoster without complications: Secondary | ICD-10-CM | POA: Diagnosis not present

## 2023-10-03 DIAGNOSIS — M6281 Muscle weakness (generalized): Secondary | ICD-10-CM | POA: Diagnosis not present

## 2023-10-03 DIAGNOSIS — R42 Dizziness and giddiness: Secondary | ICD-10-CM | POA: Diagnosis not present

## 2023-10-03 DIAGNOSIS — R2681 Unsteadiness on feet: Secondary | ICD-10-CM | POA: Diagnosis not present

## 2023-10-03 DIAGNOSIS — F5101 Primary insomnia: Secondary | ICD-10-CM | POA: Diagnosis not present

## 2023-10-03 DIAGNOSIS — E6609 Other obesity due to excess calories: Secondary | ICD-10-CM | POA: Diagnosis not present

## 2023-10-03 DIAGNOSIS — D649 Anemia, unspecified: Secondary | ICD-10-CM | POA: Diagnosis not present

## 2023-10-03 DIAGNOSIS — Z7189 Other specified counseling: Secondary | ICD-10-CM | POA: Diagnosis not present

## 2023-10-03 DIAGNOSIS — R531 Weakness: Secondary | ICD-10-CM | POA: Diagnosis not present

## 2023-10-03 DIAGNOSIS — N183 Chronic kidney disease, stage 3 unspecified: Secondary | ICD-10-CM | POA: Diagnosis not present

## 2023-10-03 DIAGNOSIS — Z741 Need for assistance with personal care: Secondary | ICD-10-CM | POA: Diagnosis not present

## 2023-10-03 DIAGNOSIS — M792 Neuralgia and neuritis, unspecified: Secondary | ICD-10-CM | POA: Diagnosis not present

## 2023-10-03 DIAGNOSIS — R55 Syncope and collapse: Secondary | ICD-10-CM | POA: Diagnosis not present

## 2023-10-03 DIAGNOSIS — B0222 Postherpetic trigeminal neuralgia: Secondary | ICD-10-CM | POA: Diagnosis not present

## 2023-10-03 DIAGNOSIS — I2510 Atherosclerotic heart disease of native coronary artery without angina pectoris: Secondary | ICD-10-CM | POA: Diagnosis not present

## 2023-10-03 DIAGNOSIS — G9340 Encephalopathy, unspecified: Secondary | ICD-10-CM | POA: Diagnosis not present

## 2023-10-03 DIAGNOSIS — F32A Depression, unspecified: Secondary | ICD-10-CM | POA: Diagnosis not present

## 2023-10-03 DIAGNOSIS — M1000 Idiopathic gout, unspecified site: Secondary | ICD-10-CM | POA: Diagnosis not present

## 2023-10-03 DIAGNOSIS — N401 Enlarged prostate with lower urinary tract symptoms: Secondary | ICD-10-CM | POA: Diagnosis not present

## 2023-10-04 DIAGNOSIS — F32A Depression, unspecified: Secondary | ICD-10-CM | POA: Diagnosis not present

## 2023-10-04 DIAGNOSIS — I10 Essential (primary) hypertension: Secondary | ICD-10-CM | POA: Diagnosis not present

## 2023-10-04 DIAGNOSIS — B029 Zoster without complications: Secondary | ICD-10-CM | POA: Diagnosis not present

## 2023-10-04 DIAGNOSIS — M792 Neuralgia and neuritis, unspecified: Secondary | ICD-10-CM | POA: Diagnosis not present

## 2023-10-04 DIAGNOSIS — L0201 Cutaneous abscess of face: Secondary | ICD-10-CM | POA: Diagnosis not present

## 2023-10-04 DIAGNOSIS — M1000 Idiopathic gout, unspecified site: Secondary | ICD-10-CM | POA: Diagnosis not present

## 2023-10-06 DIAGNOSIS — L0201 Cutaneous abscess of face: Secondary | ICD-10-CM | POA: Diagnosis not present

## 2023-10-06 DIAGNOSIS — Z7189 Other specified counseling: Secondary | ICD-10-CM | POA: Diagnosis not present

## 2023-10-06 DIAGNOSIS — N1831 Chronic kidney disease, stage 3a: Secondary | ICD-10-CM | POA: Diagnosis not present

## 2023-10-06 DIAGNOSIS — R42 Dizziness and giddiness: Secondary | ICD-10-CM | POA: Diagnosis not present

## 2023-10-06 DIAGNOSIS — M792 Neuralgia and neuritis, unspecified: Secondary | ICD-10-CM | POA: Diagnosis not present

## 2023-10-10 DIAGNOSIS — B029 Zoster without complications: Secondary | ICD-10-CM | POA: Diagnosis not present

## 2023-10-10 DIAGNOSIS — M1000 Idiopathic gout, unspecified site: Secondary | ICD-10-CM | POA: Diagnosis not present

## 2023-10-10 DIAGNOSIS — B0222 Postherpetic trigeminal neuralgia: Secondary | ICD-10-CM | POA: Diagnosis not present

## 2023-10-10 DIAGNOSIS — N1831 Chronic kidney disease, stage 3a: Secondary | ICD-10-CM | POA: Diagnosis not present

## 2023-10-10 DIAGNOSIS — I10 Essential (primary) hypertension: Secondary | ICD-10-CM | POA: Diagnosis not present

## 2023-10-10 DIAGNOSIS — D649 Anemia, unspecified: Secondary | ICD-10-CM | POA: Diagnosis not present

## 2023-10-10 DIAGNOSIS — N401 Enlarged prostate with lower urinary tract symptoms: Secondary | ICD-10-CM | POA: Diagnosis not present

## 2023-10-10 DIAGNOSIS — F32A Depression, unspecified: Secondary | ICD-10-CM | POA: Diagnosis not present

## 2023-10-11 DIAGNOSIS — F32A Depression, unspecified: Secondary | ICD-10-CM | POA: Diagnosis not present

## 2023-10-11 DIAGNOSIS — I10 Essential (primary) hypertension: Secondary | ICD-10-CM | POA: Diagnosis not present

## 2023-10-13 DIAGNOSIS — R42 Dizziness and giddiness: Secondary | ICD-10-CM | POA: Diagnosis not present

## 2023-10-13 DIAGNOSIS — F32A Depression, unspecified: Secondary | ICD-10-CM | POA: Diagnosis not present

## 2023-10-17 DIAGNOSIS — B0222 Postherpetic trigeminal neuralgia: Secondary | ICD-10-CM | POA: Diagnosis not present

## 2023-10-17 DIAGNOSIS — R531 Weakness: Secondary | ICD-10-CM | POA: Diagnosis not present

## 2023-10-17 DIAGNOSIS — N401 Enlarged prostate with lower urinary tract symptoms: Secondary | ICD-10-CM | POA: Diagnosis not present

## 2023-10-17 DIAGNOSIS — N1831 Chronic kidney disease, stage 3a: Secondary | ICD-10-CM | POA: Diagnosis not present

## 2023-10-18 DIAGNOSIS — R55 Syncope and collapse: Secondary | ICD-10-CM | POA: Diagnosis not present

## 2023-10-24 DIAGNOSIS — B0222 Postherpetic trigeminal neuralgia: Secondary | ICD-10-CM | POA: Diagnosis not present

## 2023-10-24 DIAGNOSIS — R441 Visual hallucinations: Secondary | ICD-10-CM | POA: Diagnosis not present

## 2023-10-24 DIAGNOSIS — R531 Weakness: Secondary | ICD-10-CM | POA: Diagnosis not present

## 2023-10-24 DIAGNOSIS — I10 Essential (primary) hypertension: Secondary | ICD-10-CM | POA: Diagnosis not present

## 2023-10-26 DIAGNOSIS — F411 Generalized anxiety disorder: Secondary | ICD-10-CM | POA: Diagnosis not present

## 2023-10-26 DIAGNOSIS — F330 Major depressive disorder, recurrent, mild: Secondary | ICD-10-CM | POA: Diagnosis not present

## 2023-10-26 DIAGNOSIS — R339 Retention of urine, unspecified: Secondary | ICD-10-CM | POA: Diagnosis not present

## 2023-10-26 DIAGNOSIS — F5101 Primary insomnia: Secondary | ICD-10-CM | POA: Diagnosis not present

## 2023-10-28 DIAGNOSIS — R300 Dysuria: Secondary | ICD-10-CM | POA: Diagnosis not present

## 2023-10-28 DIAGNOSIS — G4700 Insomnia, unspecified: Secondary | ICD-10-CM | POA: Diagnosis not present

## 2023-10-28 DIAGNOSIS — N390 Urinary tract infection, site not specified: Secondary | ICD-10-CM | POA: Diagnosis not present

## 2023-10-31 DIAGNOSIS — N390 Urinary tract infection, site not specified: Secondary | ICD-10-CM | POA: Diagnosis not present

## 2023-10-31 DIAGNOSIS — N401 Enlarged prostate with lower urinary tract symptoms: Secondary | ICD-10-CM | POA: Diagnosis not present

## 2023-10-31 DIAGNOSIS — Z978 Presence of other specified devices: Secondary | ICD-10-CM | POA: Diagnosis not present

## 2023-10-31 DIAGNOSIS — B0222 Postherpetic trigeminal neuralgia: Secondary | ICD-10-CM | POA: Diagnosis not present

## 2023-11-01 DIAGNOSIS — F0670 Mild neurocognitive disorder due to known physiological condition without behavioral disturbance: Secondary | ICD-10-CM | POA: Diagnosis not present

## 2023-11-01 DIAGNOSIS — Z741 Need for assistance with personal care: Secondary | ICD-10-CM | POA: Diagnosis not present

## 2023-11-01 DIAGNOSIS — N179 Acute kidney failure, unspecified: Secondary | ICD-10-CM | POA: Diagnosis not present

## 2023-11-01 DIAGNOSIS — I10 Essential (primary) hypertension: Secondary | ICD-10-CM | POA: Diagnosis not present

## 2023-11-01 DIAGNOSIS — N390 Urinary tract infection, site not specified: Secondary | ICD-10-CM | POA: Diagnosis not present

## 2023-11-01 DIAGNOSIS — B0222 Postherpetic trigeminal neuralgia: Secondary | ICD-10-CM | POA: Diagnosis not present

## 2023-11-01 DIAGNOSIS — R2689 Other abnormalities of gait and mobility: Secondary | ICD-10-CM | POA: Diagnosis not present

## 2023-11-01 DIAGNOSIS — R2681 Unsteadiness on feet: Secondary | ICD-10-CM | POA: Diagnosis not present

## 2023-11-01 DIAGNOSIS — M6281 Muscle weakness (generalized): Secondary | ICD-10-CM | POA: Diagnosis not present

## 2023-11-02 DIAGNOSIS — Z741 Need for assistance with personal care: Secondary | ICD-10-CM | POA: Diagnosis not present

## 2023-11-02 DIAGNOSIS — R2689 Other abnormalities of gait and mobility: Secondary | ICD-10-CM | POA: Diagnosis not present

## 2023-11-02 DIAGNOSIS — N179 Acute kidney failure, unspecified: Secondary | ICD-10-CM | POA: Diagnosis not present

## 2023-11-02 DIAGNOSIS — R2681 Unsteadiness on feet: Secondary | ICD-10-CM | POA: Diagnosis not present

## 2023-11-02 DIAGNOSIS — N390 Urinary tract infection, site not specified: Secondary | ICD-10-CM | POA: Diagnosis not present

## 2023-11-02 DIAGNOSIS — M6281 Muscle weakness (generalized): Secondary | ICD-10-CM | POA: Diagnosis not present

## 2023-11-02 DIAGNOSIS — F411 Generalized anxiety disorder: Secondary | ICD-10-CM | POA: Diagnosis not present

## 2023-11-02 DIAGNOSIS — F5101 Primary insomnia: Secondary | ICD-10-CM | POA: Diagnosis not present

## 2023-11-02 DIAGNOSIS — F0670 Mild neurocognitive disorder due to known physiological condition without behavioral disturbance: Secondary | ICD-10-CM | POA: Diagnosis not present

## 2023-11-02 DIAGNOSIS — I10 Essential (primary) hypertension: Secondary | ICD-10-CM | POA: Diagnosis not present

## 2023-11-02 DIAGNOSIS — F330 Major depressive disorder, recurrent, mild: Secondary | ICD-10-CM | POA: Diagnosis not present

## 2023-11-03 DIAGNOSIS — R55 Syncope and collapse: Secondary | ICD-10-CM | POA: Diagnosis not present

## 2023-11-03 DIAGNOSIS — R569 Unspecified convulsions: Secondary | ICD-10-CM | POA: Diagnosis not present

## 2023-11-03 DIAGNOSIS — R339 Retention of urine, unspecified: Secondary | ICD-10-CM | POA: Diagnosis not present

## 2023-11-03 DIAGNOSIS — N1831 Chronic kidney disease, stage 3a: Secondary | ICD-10-CM | POA: Diagnosis not present

## 2023-11-03 DIAGNOSIS — K219 Gastro-esophageal reflux disease without esophagitis: Secondary | ICD-10-CM | POA: Diagnosis not present

## 2023-11-03 DIAGNOSIS — N390 Urinary tract infection, site not specified: Secondary | ICD-10-CM | POA: Diagnosis not present

## 2023-11-03 DIAGNOSIS — M1000 Idiopathic gout, unspecified site: Secondary | ICD-10-CM | POA: Diagnosis not present

## 2023-11-04 DIAGNOSIS — F0670 Mild neurocognitive disorder due to known physiological condition without behavioral disturbance: Secondary | ICD-10-CM | POA: Diagnosis not present

## 2023-11-04 DIAGNOSIS — Z741 Need for assistance with personal care: Secondary | ICD-10-CM | POA: Diagnosis not present

## 2023-11-04 DIAGNOSIS — I10 Essential (primary) hypertension: Secondary | ICD-10-CM | POA: Diagnosis not present

## 2023-11-04 DIAGNOSIS — M6281 Muscle weakness (generalized): Secondary | ICD-10-CM | POA: Diagnosis not present

## 2023-11-04 DIAGNOSIS — N390 Urinary tract infection, site not specified: Secondary | ICD-10-CM | POA: Diagnosis not present

## 2023-11-04 DIAGNOSIS — N179 Acute kidney failure, unspecified: Secondary | ICD-10-CM | POA: Diagnosis not present

## 2023-11-04 DIAGNOSIS — R2689 Other abnormalities of gait and mobility: Secondary | ICD-10-CM | POA: Diagnosis not present

## 2023-11-04 DIAGNOSIS — R2681 Unsteadiness on feet: Secondary | ICD-10-CM | POA: Diagnosis not present

## 2023-11-05 DIAGNOSIS — Z741 Need for assistance with personal care: Secondary | ICD-10-CM | POA: Diagnosis not present

## 2023-11-05 DIAGNOSIS — N390 Urinary tract infection, site not specified: Secondary | ICD-10-CM | POA: Diagnosis not present

## 2023-11-05 DIAGNOSIS — I10 Essential (primary) hypertension: Secondary | ICD-10-CM | POA: Diagnosis not present

## 2023-11-05 DIAGNOSIS — F0670 Mild neurocognitive disorder due to known physiological condition without behavioral disturbance: Secondary | ICD-10-CM | POA: Diagnosis not present

## 2023-11-05 DIAGNOSIS — N179 Acute kidney failure, unspecified: Secondary | ICD-10-CM | POA: Diagnosis not present

## 2023-11-05 DIAGNOSIS — R2681 Unsteadiness on feet: Secondary | ICD-10-CM | POA: Diagnosis not present

## 2023-11-05 DIAGNOSIS — M6281 Muscle weakness (generalized): Secondary | ICD-10-CM | POA: Diagnosis not present

## 2023-11-05 DIAGNOSIS — R2689 Other abnormalities of gait and mobility: Secondary | ICD-10-CM | POA: Diagnosis not present

## 2023-11-06 DIAGNOSIS — I10 Essential (primary) hypertension: Secondary | ICD-10-CM | POA: Diagnosis not present

## 2023-11-06 DIAGNOSIS — Z741 Need for assistance with personal care: Secondary | ICD-10-CM | POA: Diagnosis not present

## 2023-11-06 DIAGNOSIS — F0670 Mild neurocognitive disorder due to known physiological condition without behavioral disturbance: Secondary | ICD-10-CM | POA: Diagnosis not present

## 2023-11-06 DIAGNOSIS — N179 Acute kidney failure, unspecified: Secondary | ICD-10-CM | POA: Diagnosis not present

## 2023-11-06 DIAGNOSIS — R2689 Other abnormalities of gait and mobility: Secondary | ICD-10-CM | POA: Diagnosis not present

## 2023-11-06 DIAGNOSIS — R2681 Unsteadiness on feet: Secondary | ICD-10-CM | POA: Diagnosis not present

## 2023-11-06 DIAGNOSIS — N390 Urinary tract infection, site not specified: Secondary | ICD-10-CM | POA: Diagnosis not present

## 2023-11-06 DIAGNOSIS — M6281 Muscle weakness (generalized): Secondary | ICD-10-CM | POA: Diagnosis not present

## 2023-11-07 DIAGNOSIS — R2689 Other abnormalities of gait and mobility: Secondary | ICD-10-CM | POA: Diagnosis not present

## 2023-11-07 DIAGNOSIS — M6281 Muscle weakness (generalized): Secondary | ICD-10-CM | POA: Diagnosis not present

## 2023-11-07 DIAGNOSIS — N179 Acute kidney failure, unspecified: Secondary | ICD-10-CM | POA: Diagnosis not present

## 2023-11-07 DIAGNOSIS — B0222 Postherpetic trigeminal neuralgia: Secondary | ICD-10-CM | POA: Diagnosis not present

## 2023-11-07 DIAGNOSIS — R2681 Unsteadiness on feet: Secondary | ICD-10-CM | POA: Diagnosis not present

## 2023-11-07 DIAGNOSIS — Z741 Need for assistance with personal care: Secondary | ICD-10-CM | POA: Diagnosis not present

## 2023-11-07 DIAGNOSIS — N390 Urinary tract infection, site not specified: Secondary | ICD-10-CM | POA: Diagnosis not present

## 2023-11-07 DIAGNOSIS — F0670 Mild neurocognitive disorder due to known physiological condition without behavioral disturbance: Secondary | ICD-10-CM | POA: Diagnosis not present

## 2023-11-07 DIAGNOSIS — I10 Essential (primary) hypertension: Secondary | ICD-10-CM | POA: Diagnosis not present

## 2023-11-08 DIAGNOSIS — G4700 Insomnia, unspecified: Secondary | ICD-10-CM | POA: Diagnosis not present

## 2023-11-08 DIAGNOSIS — N390 Urinary tract infection, site not specified: Secondary | ICD-10-CM | POA: Diagnosis not present

## 2023-11-08 DIAGNOSIS — F0670 Mild neurocognitive disorder due to known physiological condition without behavioral disturbance: Secondary | ICD-10-CM | POA: Diagnosis not present

## 2023-11-08 DIAGNOSIS — I10 Essential (primary) hypertension: Secondary | ICD-10-CM | POA: Diagnosis not present

## 2023-11-08 DIAGNOSIS — K219 Gastro-esophageal reflux disease without esophagitis: Secondary | ICD-10-CM | POA: Diagnosis not present

## 2023-11-08 DIAGNOSIS — R2689 Other abnormalities of gait and mobility: Secondary | ICD-10-CM | POA: Diagnosis not present

## 2023-11-08 DIAGNOSIS — Z741 Need for assistance with personal care: Secondary | ICD-10-CM | POA: Diagnosis not present

## 2023-11-08 DIAGNOSIS — R2681 Unsteadiness on feet: Secondary | ICD-10-CM | POA: Diagnosis not present

## 2023-11-08 DIAGNOSIS — M6281 Muscle weakness (generalized): Secondary | ICD-10-CM | POA: Diagnosis not present

## 2023-11-08 DIAGNOSIS — N179 Acute kidney failure, unspecified: Secondary | ICD-10-CM | POA: Diagnosis not present

## 2023-11-09 DIAGNOSIS — M6281 Muscle weakness (generalized): Secondary | ICD-10-CM | POA: Diagnosis not present

## 2023-11-09 DIAGNOSIS — N390 Urinary tract infection, site not specified: Secondary | ICD-10-CM | POA: Diagnosis not present

## 2023-11-09 DIAGNOSIS — I10 Essential (primary) hypertension: Secondary | ICD-10-CM | POA: Diagnosis not present

## 2023-11-09 DIAGNOSIS — F0670 Mild neurocognitive disorder due to known physiological condition without behavioral disturbance: Secondary | ICD-10-CM | POA: Diagnosis not present

## 2023-11-09 DIAGNOSIS — N179 Acute kidney failure, unspecified: Secondary | ICD-10-CM | POA: Diagnosis not present

## 2023-11-09 DIAGNOSIS — R2689 Other abnormalities of gait and mobility: Secondary | ICD-10-CM | POA: Diagnosis not present

## 2023-11-09 DIAGNOSIS — R2681 Unsteadiness on feet: Secondary | ICD-10-CM | POA: Diagnosis not present

## 2023-11-09 DIAGNOSIS — Z741 Need for assistance with personal care: Secondary | ICD-10-CM | POA: Diagnosis not present

## 2023-11-11 DIAGNOSIS — Z741 Need for assistance with personal care: Secondary | ICD-10-CM | POA: Diagnosis not present

## 2023-11-11 DIAGNOSIS — N179 Acute kidney failure, unspecified: Secondary | ICD-10-CM | POA: Diagnosis not present

## 2023-11-11 DIAGNOSIS — N390 Urinary tract infection, site not specified: Secondary | ICD-10-CM | POA: Diagnosis not present

## 2023-11-11 DIAGNOSIS — R2681 Unsteadiness on feet: Secondary | ICD-10-CM | POA: Diagnosis not present

## 2023-11-11 DIAGNOSIS — M6281 Muscle weakness (generalized): Secondary | ICD-10-CM | POA: Diagnosis not present

## 2023-11-11 DIAGNOSIS — F0670 Mild neurocognitive disorder due to known physiological condition without behavioral disturbance: Secondary | ICD-10-CM | POA: Diagnosis not present

## 2023-11-11 DIAGNOSIS — I10 Essential (primary) hypertension: Secondary | ICD-10-CM | POA: Diagnosis not present

## 2023-11-11 DIAGNOSIS — R2689 Other abnormalities of gait and mobility: Secondary | ICD-10-CM | POA: Diagnosis not present

## 2023-11-13 DIAGNOSIS — M6281 Muscle weakness (generalized): Secondary | ICD-10-CM | POA: Diagnosis not present

## 2023-11-13 DIAGNOSIS — N179 Acute kidney failure, unspecified: Secondary | ICD-10-CM | POA: Diagnosis not present

## 2023-11-13 DIAGNOSIS — N390 Urinary tract infection, site not specified: Secondary | ICD-10-CM | POA: Diagnosis not present

## 2023-11-13 DIAGNOSIS — Z741 Need for assistance with personal care: Secondary | ICD-10-CM | POA: Diagnosis not present

## 2023-11-13 DIAGNOSIS — F0670 Mild neurocognitive disorder due to known physiological condition without behavioral disturbance: Secondary | ICD-10-CM | POA: Diagnosis not present

## 2023-11-13 DIAGNOSIS — I10 Essential (primary) hypertension: Secondary | ICD-10-CM | POA: Diagnosis not present

## 2023-11-13 DIAGNOSIS — R2689 Other abnormalities of gait and mobility: Secondary | ICD-10-CM | POA: Diagnosis not present

## 2023-11-13 DIAGNOSIS — R2681 Unsteadiness on feet: Secondary | ICD-10-CM | POA: Diagnosis not present

## 2023-11-14 DIAGNOSIS — F0670 Mild neurocognitive disorder due to known physiological condition without behavioral disturbance: Secondary | ICD-10-CM | POA: Diagnosis not present

## 2023-11-14 DIAGNOSIS — R2689 Other abnormalities of gait and mobility: Secondary | ICD-10-CM | POA: Diagnosis not present

## 2023-11-14 DIAGNOSIS — G4700 Insomnia, unspecified: Secondary | ICD-10-CM | POA: Diagnosis not present

## 2023-11-14 DIAGNOSIS — N179 Acute kidney failure, unspecified: Secondary | ICD-10-CM | POA: Diagnosis not present

## 2023-11-14 DIAGNOSIS — Z741 Need for assistance with personal care: Secondary | ICD-10-CM | POA: Diagnosis not present

## 2023-11-14 DIAGNOSIS — I10 Essential (primary) hypertension: Secondary | ICD-10-CM | POA: Diagnosis not present

## 2023-11-14 DIAGNOSIS — N390 Urinary tract infection, site not specified: Secondary | ICD-10-CM | POA: Diagnosis not present

## 2023-11-14 DIAGNOSIS — R2681 Unsteadiness on feet: Secondary | ICD-10-CM | POA: Diagnosis not present

## 2023-11-14 DIAGNOSIS — B0222 Postherpetic trigeminal neuralgia: Secondary | ICD-10-CM | POA: Diagnosis not present

## 2023-11-14 DIAGNOSIS — M6281 Muscle weakness (generalized): Secondary | ICD-10-CM | POA: Diagnosis not present

## 2023-11-15 DIAGNOSIS — N390 Urinary tract infection, site not specified: Secondary | ICD-10-CM | POA: Diagnosis not present

## 2023-11-15 DIAGNOSIS — N179 Acute kidney failure, unspecified: Secondary | ICD-10-CM | POA: Diagnosis not present

## 2023-11-15 DIAGNOSIS — Z741 Need for assistance with personal care: Secondary | ICD-10-CM | POA: Diagnosis not present

## 2023-11-15 DIAGNOSIS — F0670 Mild neurocognitive disorder due to known physiological condition without behavioral disturbance: Secondary | ICD-10-CM | POA: Diagnosis not present

## 2023-11-15 DIAGNOSIS — I10 Essential (primary) hypertension: Secondary | ICD-10-CM | POA: Diagnosis not present

## 2023-11-15 DIAGNOSIS — R2689 Other abnormalities of gait and mobility: Secondary | ICD-10-CM | POA: Diagnosis not present

## 2023-11-15 DIAGNOSIS — M6281 Muscle weakness (generalized): Secondary | ICD-10-CM | POA: Diagnosis not present

## 2023-11-15 DIAGNOSIS — R2681 Unsteadiness on feet: Secondary | ICD-10-CM | POA: Diagnosis not present

## 2023-11-16 DIAGNOSIS — N390 Urinary tract infection, site not specified: Secondary | ICD-10-CM | POA: Diagnosis not present

## 2023-11-16 DIAGNOSIS — R2681 Unsteadiness on feet: Secondary | ICD-10-CM | POA: Diagnosis not present

## 2023-11-16 DIAGNOSIS — I10 Essential (primary) hypertension: Secondary | ICD-10-CM | POA: Diagnosis not present

## 2023-11-16 DIAGNOSIS — N179 Acute kidney failure, unspecified: Secondary | ICD-10-CM | POA: Diagnosis not present

## 2023-11-16 DIAGNOSIS — R2689 Other abnormalities of gait and mobility: Secondary | ICD-10-CM | POA: Diagnosis not present

## 2023-11-16 DIAGNOSIS — F0670 Mild neurocognitive disorder due to known physiological condition without behavioral disturbance: Secondary | ICD-10-CM | POA: Diagnosis not present

## 2023-11-16 DIAGNOSIS — M6281 Muscle weakness (generalized): Secondary | ICD-10-CM | POA: Diagnosis not present

## 2023-11-16 DIAGNOSIS — Z741 Need for assistance with personal care: Secondary | ICD-10-CM | POA: Diagnosis not present

## 2023-11-17 DIAGNOSIS — F0670 Mild neurocognitive disorder due to known physiological condition without behavioral disturbance: Secondary | ICD-10-CM | POA: Diagnosis not present

## 2023-11-17 DIAGNOSIS — Z741 Need for assistance with personal care: Secondary | ICD-10-CM | POA: Diagnosis not present

## 2023-11-17 DIAGNOSIS — R2681 Unsteadiness on feet: Secondary | ICD-10-CM | POA: Diagnosis not present

## 2023-11-17 DIAGNOSIS — N179 Acute kidney failure, unspecified: Secondary | ICD-10-CM | POA: Diagnosis not present

## 2023-11-17 DIAGNOSIS — M6281 Muscle weakness (generalized): Secondary | ICD-10-CM | POA: Diagnosis not present

## 2023-11-17 DIAGNOSIS — R2689 Other abnormalities of gait and mobility: Secondary | ICD-10-CM | POA: Diagnosis not present

## 2023-11-17 DIAGNOSIS — I10 Essential (primary) hypertension: Secondary | ICD-10-CM | POA: Diagnosis not present

## 2023-11-17 DIAGNOSIS — N390 Urinary tract infection, site not specified: Secondary | ICD-10-CM | POA: Diagnosis not present

## 2023-11-21 DIAGNOSIS — M6281 Muscle weakness (generalized): Secondary | ICD-10-CM | POA: Diagnosis not present

## 2023-11-21 DIAGNOSIS — N179 Acute kidney failure, unspecified: Secondary | ICD-10-CM | POA: Diagnosis not present

## 2023-11-21 DIAGNOSIS — I10 Essential (primary) hypertension: Secondary | ICD-10-CM | POA: Diagnosis not present

## 2023-11-21 DIAGNOSIS — R2689 Other abnormalities of gait and mobility: Secondary | ICD-10-CM | POA: Diagnosis not present

## 2023-11-21 DIAGNOSIS — R2681 Unsteadiness on feet: Secondary | ICD-10-CM | POA: Diagnosis not present

## 2023-11-21 DIAGNOSIS — Z741 Need for assistance with personal care: Secondary | ICD-10-CM | POA: Diagnosis not present

## 2023-11-21 DIAGNOSIS — F0670 Mild neurocognitive disorder due to known physiological condition without behavioral disturbance: Secondary | ICD-10-CM | POA: Diagnosis not present

## 2023-11-21 DIAGNOSIS — N390 Urinary tract infection, site not specified: Secondary | ICD-10-CM | POA: Diagnosis not present

## 2023-11-22 DIAGNOSIS — K219 Gastro-esophageal reflux disease without esophagitis: Secondary | ICD-10-CM | POA: Diagnosis not present

## 2023-11-22 DIAGNOSIS — N390 Urinary tract infection, site not specified: Secondary | ICD-10-CM | POA: Diagnosis not present

## 2023-11-22 DIAGNOSIS — F0670 Mild neurocognitive disorder due to known physiological condition without behavioral disturbance: Secondary | ICD-10-CM | POA: Diagnosis not present

## 2023-11-22 DIAGNOSIS — M1000 Idiopathic gout, unspecified site: Secondary | ICD-10-CM | POA: Diagnosis not present

## 2023-11-22 DIAGNOSIS — I10 Essential (primary) hypertension: Secondary | ICD-10-CM | POA: Diagnosis not present

## 2023-11-22 DIAGNOSIS — N179 Acute kidney failure, unspecified: Secondary | ICD-10-CM | POA: Diagnosis not present

## 2023-11-22 DIAGNOSIS — Z741 Need for assistance with personal care: Secondary | ICD-10-CM | POA: Diagnosis not present

## 2023-11-22 DIAGNOSIS — R2689 Other abnormalities of gait and mobility: Secondary | ICD-10-CM | POA: Diagnosis not present

## 2023-11-22 DIAGNOSIS — M6281 Muscle weakness (generalized): Secondary | ICD-10-CM | POA: Diagnosis not present

## 2023-11-22 DIAGNOSIS — R2681 Unsteadiness on feet: Secondary | ICD-10-CM | POA: Diagnosis not present

## 2023-11-23 DIAGNOSIS — N1831 Chronic kidney disease, stage 3a: Secondary | ICD-10-CM | POA: Diagnosis not present

## 2023-11-23 DIAGNOSIS — R339 Retention of urine, unspecified: Secondary | ICD-10-CM | POA: Diagnosis not present

## 2023-11-24 DIAGNOSIS — H353110 Nonexudative age-related macular degeneration, right eye, stage unspecified: Secondary | ICD-10-CM | POA: Diagnosis not present

## 2023-11-24 DIAGNOSIS — H524 Presbyopia: Secondary | ICD-10-CM | POA: Diagnosis not present

## 2023-11-25 DIAGNOSIS — Z741 Need for assistance with personal care: Secondary | ICD-10-CM | POA: Diagnosis not present

## 2023-11-25 DIAGNOSIS — M6281 Muscle weakness (generalized): Secondary | ICD-10-CM | POA: Diagnosis not present

## 2023-11-25 DIAGNOSIS — R2689 Other abnormalities of gait and mobility: Secondary | ICD-10-CM | POA: Diagnosis not present

## 2023-11-25 DIAGNOSIS — N390 Urinary tract infection, site not specified: Secondary | ICD-10-CM | POA: Diagnosis not present

## 2023-11-25 DIAGNOSIS — N179 Acute kidney failure, unspecified: Secondary | ICD-10-CM | POA: Diagnosis not present

## 2023-11-25 DIAGNOSIS — I10 Essential (primary) hypertension: Secondary | ICD-10-CM | POA: Diagnosis not present

## 2023-11-25 DIAGNOSIS — R2681 Unsteadiness on feet: Secondary | ICD-10-CM | POA: Diagnosis not present

## 2023-11-25 DIAGNOSIS — F0670 Mild neurocognitive disorder due to known physiological condition without behavioral disturbance: Secondary | ICD-10-CM | POA: Diagnosis not present

## 2023-11-28 DIAGNOSIS — N1831 Chronic kidney disease, stage 3a: Secondary | ICD-10-CM | POA: Diagnosis not present

## 2023-11-28 DIAGNOSIS — N401 Enlarged prostate with lower urinary tract symptoms: Secondary | ICD-10-CM | POA: Diagnosis not present

## 2023-11-28 DIAGNOSIS — F32A Depression, unspecified: Secondary | ICD-10-CM | POA: Diagnosis not present

## 2023-11-28 DIAGNOSIS — G4700 Insomnia, unspecified: Secondary | ICD-10-CM | POA: Diagnosis not present

## 2023-11-28 DIAGNOSIS — B0222 Postherpetic trigeminal neuralgia: Secondary | ICD-10-CM | POA: Diagnosis not present

## 2023-11-28 DIAGNOSIS — I10 Essential (primary) hypertension: Secondary | ICD-10-CM | POA: Diagnosis not present

## 2023-11-28 DIAGNOSIS — M1000 Idiopathic gout, unspecified site: Secondary | ICD-10-CM | POA: Diagnosis not present

## 2023-11-29 DIAGNOSIS — N390 Urinary tract infection, site not specified: Secondary | ICD-10-CM | POA: Diagnosis not present

## 2023-11-29 DIAGNOSIS — R2681 Unsteadiness on feet: Secondary | ICD-10-CM | POA: Diagnosis not present

## 2023-11-29 DIAGNOSIS — I10 Essential (primary) hypertension: Secondary | ICD-10-CM | POA: Diagnosis not present

## 2023-11-29 DIAGNOSIS — F0670 Mild neurocognitive disorder due to known physiological condition without behavioral disturbance: Secondary | ICD-10-CM | POA: Diagnosis not present

## 2023-11-29 DIAGNOSIS — Z741 Need for assistance with personal care: Secondary | ICD-10-CM | POA: Diagnosis not present

## 2023-11-29 DIAGNOSIS — R296 Repeated falls: Secondary | ICD-10-CM | POA: Diagnosis not present

## 2023-11-29 DIAGNOSIS — M6281 Muscle weakness (generalized): Secondary | ICD-10-CM | POA: Diagnosis not present

## 2023-11-29 DIAGNOSIS — N179 Acute kidney failure, unspecified: Secondary | ICD-10-CM | POA: Diagnosis not present

## 2023-11-29 DIAGNOSIS — R2689 Other abnormalities of gait and mobility: Secondary | ICD-10-CM | POA: Diagnosis not present

## 2023-11-30 DIAGNOSIS — N179 Acute kidney failure, unspecified: Secondary | ICD-10-CM | POA: Diagnosis not present

## 2023-11-30 DIAGNOSIS — R2689 Other abnormalities of gait and mobility: Secondary | ICD-10-CM | POA: Diagnosis not present

## 2023-11-30 DIAGNOSIS — F0670 Mild neurocognitive disorder due to known physiological condition without behavioral disturbance: Secondary | ICD-10-CM | POA: Diagnosis not present

## 2023-11-30 DIAGNOSIS — Z741 Need for assistance with personal care: Secondary | ICD-10-CM | POA: Diagnosis not present

## 2023-11-30 DIAGNOSIS — I10 Essential (primary) hypertension: Secondary | ICD-10-CM | POA: Diagnosis not present

## 2023-11-30 DIAGNOSIS — N390 Urinary tract infection, site not specified: Secondary | ICD-10-CM | POA: Diagnosis not present

## 2023-11-30 DIAGNOSIS — M6281 Muscle weakness (generalized): Secondary | ICD-10-CM | POA: Diagnosis not present

## 2023-11-30 DIAGNOSIS — R2681 Unsteadiness on feet: Secondary | ICD-10-CM | POA: Diagnosis not present

## 2023-12-02 DIAGNOSIS — I10 Essential (primary) hypertension: Secondary | ICD-10-CM | POA: Diagnosis not present

## 2023-12-02 DIAGNOSIS — Z741 Need for assistance with personal care: Secondary | ICD-10-CM | POA: Diagnosis not present

## 2023-12-02 DIAGNOSIS — R2689 Other abnormalities of gait and mobility: Secondary | ICD-10-CM | POA: Diagnosis not present

## 2023-12-02 DIAGNOSIS — N179 Acute kidney failure, unspecified: Secondary | ICD-10-CM | POA: Diagnosis not present

## 2023-12-02 DIAGNOSIS — M6281 Muscle weakness (generalized): Secondary | ICD-10-CM | POA: Diagnosis not present

## 2023-12-02 DIAGNOSIS — R2681 Unsteadiness on feet: Secondary | ICD-10-CM | POA: Diagnosis not present

## 2023-12-02 DIAGNOSIS — N390 Urinary tract infection, site not specified: Secondary | ICD-10-CM | POA: Diagnosis not present

## 2023-12-02 DIAGNOSIS — F0670 Mild neurocognitive disorder due to known physiological condition without behavioral disturbance: Secondary | ICD-10-CM | POA: Diagnosis not present

## 2023-12-05 DIAGNOSIS — F0670 Mild neurocognitive disorder due to known physiological condition without behavioral disturbance: Secondary | ICD-10-CM | POA: Diagnosis not present

## 2023-12-05 DIAGNOSIS — R2681 Unsteadiness on feet: Secondary | ICD-10-CM | POA: Diagnosis not present

## 2023-12-05 DIAGNOSIS — R2689 Other abnormalities of gait and mobility: Secondary | ICD-10-CM | POA: Diagnosis not present

## 2023-12-05 DIAGNOSIS — M25519 Pain in unspecified shoulder: Secondary | ICD-10-CM | POA: Diagnosis not present

## 2023-12-05 DIAGNOSIS — N179 Acute kidney failure, unspecified: Secondary | ICD-10-CM | POA: Diagnosis not present

## 2023-12-05 DIAGNOSIS — I10 Essential (primary) hypertension: Secondary | ICD-10-CM | POA: Diagnosis not present

## 2023-12-05 DIAGNOSIS — N390 Urinary tract infection, site not specified: Secondary | ICD-10-CM | POA: Diagnosis not present

## 2023-12-05 DIAGNOSIS — M6281 Muscle weakness (generalized): Secondary | ICD-10-CM | POA: Diagnosis not present

## 2023-12-05 DIAGNOSIS — Z741 Need for assistance with personal care: Secondary | ICD-10-CM | POA: Diagnosis not present

## 2023-12-05 DIAGNOSIS — N1831 Chronic kidney disease, stage 3a: Secondary | ICD-10-CM | POA: Diagnosis not present

## 2023-12-05 DIAGNOSIS — B0222 Postherpetic trigeminal neuralgia: Secondary | ICD-10-CM | POA: Diagnosis not present

## 2023-12-06 DIAGNOSIS — I10 Essential (primary) hypertension: Secondary | ICD-10-CM | POA: Diagnosis not present

## 2023-12-06 DIAGNOSIS — F0670 Mild neurocognitive disorder due to known physiological condition without behavioral disturbance: Secondary | ICD-10-CM | POA: Diagnosis not present

## 2023-12-06 DIAGNOSIS — M6281 Muscle weakness (generalized): Secondary | ICD-10-CM | POA: Diagnosis not present

## 2023-12-06 DIAGNOSIS — B0222 Postherpetic trigeminal neuralgia: Secondary | ICD-10-CM | POA: Diagnosis not present

## 2023-12-06 DIAGNOSIS — M25519 Pain in unspecified shoulder: Secondary | ICD-10-CM | POA: Diagnosis not present

## 2023-12-06 DIAGNOSIS — N390 Urinary tract infection, site not specified: Secondary | ICD-10-CM | POA: Diagnosis not present

## 2023-12-06 DIAGNOSIS — R2681 Unsteadiness on feet: Secondary | ICD-10-CM | POA: Diagnosis not present

## 2023-12-06 DIAGNOSIS — R2689 Other abnormalities of gait and mobility: Secondary | ICD-10-CM | POA: Diagnosis not present

## 2023-12-06 DIAGNOSIS — N179 Acute kidney failure, unspecified: Secondary | ICD-10-CM | POA: Diagnosis not present

## 2023-12-06 DIAGNOSIS — F32A Depression, unspecified: Secondary | ICD-10-CM | POA: Diagnosis not present

## 2023-12-06 DIAGNOSIS — Z741 Need for assistance with personal care: Secondary | ICD-10-CM | POA: Diagnosis not present

## 2023-12-07 DIAGNOSIS — M6281 Muscle weakness (generalized): Secondary | ICD-10-CM | POA: Diagnosis not present

## 2023-12-07 DIAGNOSIS — R2681 Unsteadiness on feet: Secondary | ICD-10-CM | POA: Diagnosis not present

## 2023-12-07 DIAGNOSIS — I10 Essential (primary) hypertension: Secondary | ICD-10-CM | POA: Diagnosis not present

## 2023-12-07 DIAGNOSIS — N390 Urinary tract infection, site not specified: Secondary | ICD-10-CM | POA: Diagnosis not present

## 2023-12-07 DIAGNOSIS — R2689 Other abnormalities of gait and mobility: Secondary | ICD-10-CM | POA: Diagnosis not present

## 2023-12-07 DIAGNOSIS — N179 Acute kidney failure, unspecified: Secondary | ICD-10-CM | POA: Diagnosis not present

## 2023-12-07 DIAGNOSIS — F0670 Mild neurocognitive disorder due to known physiological condition without behavioral disturbance: Secondary | ICD-10-CM | POA: Diagnosis not present

## 2023-12-07 DIAGNOSIS — Z741 Need for assistance with personal care: Secondary | ICD-10-CM | POA: Diagnosis not present

## 2023-12-08 DIAGNOSIS — M6281 Muscle weakness (generalized): Secondary | ICD-10-CM | POA: Diagnosis not present

## 2023-12-08 DIAGNOSIS — I10 Essential (primary) hypertension: Secondary | ICD-10-CM | POA: Diagnosis not present

## 2023-12-08 DIAGNOSIS — Z741 Need for assistance with personal care: Secondary | ICD-10-CM | POA: Diagnosis not present

## 2023-12-08 DIAGNOSIS — R2689 Other abnormalities of gait and mobility: Secondary | ICD-10-CM | POA: Diagnosis not present

## 2023-12-08 DIAGNOSIS — N390 Urinary tract infection, site not specified: Secondary | ICD-10-CM | POA: Diagnosis not present

## 2023-12-08 DIAGNOSIS — N179 Acute kidney failure, unspecified: Secondary | ICD-10-CM | POA: Diagnosis not present

## 2023-12-08 DIAGNOSIS — R2681 Unsteadiness on feet: Secondary | ICD-10-CM | POA: Diagnosis not present

## 2023-12-08 DIAGNOSIS — F0670 Mild neurocognitive disorder due to known physiological condition without behavioral disturbance: Secondary | ICD-10-CM | POA: Diagnosis not present

## 2023-12-10 DIAGNOSIS — I959 Hypotension, unspecified: Secondary | ICD-10-CM | POA: Diagnosis not present

## 2023-12-10 DIAGNOSIS — R0689 Other abnormalities of breathing: Secondary | ICD-10-CM | POA: Diagnosis not present

## 2023-12-10 DIAGNOSIS — I517 Cardiomegaly: Secondary | ICD-10-CM | POA: Diagnosis not present

## 2023-12-10 DIAGNOSIS — E669 Obesity, unspecified: Secondary | ICD-10-CM | POA: Diagnosis not present

## 2023-12-10 DIAGNOSIS — K828 Other specified diseases of gallbladder: Secondary | ICD-10-CM | POA: Diagnosis not present

## 2023-12-10 DIAGNOSIS — I2510 Atherosclerotic heart disease of native coronary artery without angina pectoris: Secondary | ICD-10-CM | POA: Diagnosis not present

## 2023-12-10 DIAGNOSIS — K219 Gastro-esophageal reflux disease without esophagitis: Secondary | ICD-10-CM | POA: Diagnosis not present

## 2023-12-10 DIAGNOSIS — R001 Bradycardia, unspecified: Secondary | ICD-10-CM | POA: Diagnosis not present

## 2023-12-10 DIAGNOSIS — Z515 Encounter for palliative care: Secondary | ICD-10-CM | POA: Diagnosis not present

## 2023-12-10 DIAGNOSIS — R0902 Hypoxemia: Secondary | ICD-10-CM | POA: Diagnosis not present

## 2023-12-10 DIAGNOSIS — Z7902 Long term (current) use of antithrombotics/antiplatelets: Secondary | ICD-10-CM | POA: Diagnosis not present

## 2023-12-10 DIAGNOSIS — R6521 Severe sepsis with septic shock: Secondary | ICD-10-CM | POA: Diagnosis not present

## 2023-12-10 DIAGNOSIS — K5730 Diverticulosis of large intestine without perforation or abscess without bleeding: Secondary | ICD-10-CM | POA: Diagnosis not present

## 2023-12-10 DIAGNOSIS — I129 Hypertensive chronic kidney disease with stage 1 through stage 4 chronic kidney disease, or unspecified chronic kidney disease: Secondary | ICD-10-CM | POA: Diagnosis not present

## 2023-12-10 DIAGNOSIS — I672 Cerebral atherosclerosis: Secondary | ICD-10-CM | POA: Diagnosis not present

## 2023-12-10 DIAGNOSIS — N289 Disorder of kidney and ureter, unspecified: Secondary | ICD-10-CM | POA: Diagnosis not present

## 2023-12-10 DIAGNOSIS — R4182 Altered mental status, unspecified: Secondary | ICD-10-CM | POA: Diagnosis not present

## 2023-12-10 DIAGNOSIS — Z7401 Bed confinement status: Secondary | ICD-10-CM | POA: Diagnosis not present

## 2023-12-10 DIAGNOSIS — N189 Chronic kidney disease, unspecified: Secondary | ICD-10-CM | POA: Diagnosis not present

## 2023-12-10 DIAGNOSIS — N281 Cyst of kidney, acquired: Secondary | ICD-10-CM | POA: Diagnosis not present

## 2023-12-10 DIAGNOSIS — Z6827 Body mass index (BMI) 27.0-27.9, adult: Secondary | ICD-10-CM | POA: Diagnosis not present

## 2023-12-10 DIAGNOSIS — I4891 Unspecified atrial fibrillation: Secondary | ICD-10-CM | POA: Diagnosis not present

## 2023-12-10 DIAGNOSIS — R404 Transient alteration of awareness: Secondary | ICD-10-CM | POA: Diagnosis not present

## 2023-12-10 DIAGNOSIS — I499 Cardiac arrhythmia, unspecified: Secondary | ICD-10-CM | POA: Diagnosis not present

## 2023-12-10 DIAGNOSIS — A419 Sepsis, unspecified organism: Secondary | ICD-10-CM | POA: Diagnosis not present

## 2023-12-16 DEATH — deceased
